# Patient Record
Sex: Male | Born: 1990 | Race: White | Hispanic: No | Marital: Single | Smoking: Former smoker
Health system: Southern US, Community
[De-identification: ages and names within clinical notes are randomized; demographics above are authoritative.]

## PROBLEM LIST (undated history)

## (undated) DIAGNOSIS — J45909 Unspecified asthma, uncomplicated: Secondary | ICD-10-CM

## (undated) HISTORY — DX: Unspecified asthma, uncomplicated: J45.909

## (undated) HISTORY — PX: HERNIA REPAIR: SHX51

---

## 2016-11-16 ENCOUNTER — Ambulatory Visit
Admission: EM | Admit: 2016-11-16 | Discharge: 2016-11-16 | Disposition: A | Discharge status: Home / Self Care | Payer: Self-pay | Attending: Family Medicine | Admitting: Family Medicine

## 2016-11-16 ENCOUNTER — Encounter: Payer: Self-pay | Admitting: Emergency Medicine

## 2016-11-16 DIAGNOSIS — H6502 Acute serous otitis media, left ear: Secondary | ICD-10-CM

## 2016-11-16 DIAGNOSIS — H6123 Impacted cerumen, bilateral: Secondary | ICD-10-CM

## 2016-11-16 MED ORDER — AMOXICILLIN 875 MG PO TABS: 875.0000 mg | ORAL_TABLET | Freq: Two times a day (BID) | ORAL | 0 refills | Status: DC

## 2016-11-16 NOTE — ED Triage Notes (Signed)
Patient c/o left ear pain for the past 3 days ago.

## 2016-11-16 NOTE — ED Provider Notes (Signed)
[facility]    CSN: [id number] Arrival date & time: 11/16/16  [id number]     History   Chief Complaint Chief Complaint  Patient presents with  . Otalgia    HPI Miguel RailCharles G Alkhatib Jr. is a 26 y.o. male.   HPI  History reviewed. No pertinent past medical history.  There are no active problems to display for this patient.   Past Surgical History:  Procedure Laterality Date  . HERNIA REPAIR         Home Medications    Prior to Admission medications   Medication Sig Start Date End Date Taking? Authorizing Provider  amoxicillin (AMOXIL) 875 MG tablet Take 1 tablet (875 mg total) by mouth 2 (two) times daily. 11/16/16   Payton Mccallumrlando Torina Ey, MD    Family History History reviewed. No pertinent family history.  Social History Social History  Substance Use Topics  . Smoking status: Current Every Day Smoker    Packs/day: 1.00    Types: Cigarettes  . Smokeless tobacco: Never Used  . Alcohol use Yes     Allergies   Patient has no known allergies.   Review of Systems Review of Systems   Physical Exam Triage Vital Signs ED Triage Vitals  Enc Vitals Group     BP 11/16/16 0930 (!) 141/91     Pulse Rate 11/16/16 0930 85     Resp 11/16/16 0930 16     Temp 11/16/16 0930 99.6 F (37.6 C)     Temp Source 11/16/16 0930 Oral     SpO2 11/16/16 0930 96 %     Weight 11/16/16 0929 225 lb (102.1 kg)     Height 11/16/16 0929 5\' 6"  (1.676 m)     Head Circumference --      Peak Flow --      Pain Score 11/16/16 0930 6     Pain Loc --      Pain Edu? --      Excl. in GC? --    No data found.   Updated Vital Signs BP (!) 141/91 (BP Location: Left Arm)   Pulse 85   Temp 99.6 F (37.6 C) (Oral)   Resp 16   Ht 5\' 6"  (1.676 m)   Wt 225 lb (102.1 kg)   SpO2 96%   BMI 36.32 kg/m   Visual Acuity Right Eye Distance:   Left Eye Distance:   Bilateral Distance:    Right Eye Near:   Left Eye Near:    Bilateral Near:     Physical Exam  Constitutional: He appears  well-developed and well-nourished. No distress.  HENT:  Head: Normocephalic and atraumatic.  Right Ear: Tympanic membrane, external ear and ear canal normal.  Left Ear: External ear and ear canal normal. Tympanic membrane is erythematous and bulging. A middle ear effusion is present.  Nose: Nose normal.  Mouth/Throat: Uvula is midline, oropharynx is clear and moist and mucous membranes are normal. No oropharyngeal exudate or tonsillar abscesses.  Cerumen impaction of ear canals bilaterally; TMs visualized after cerumen disimpaction  Eyes: Conjunctivae and EOM are normal. Pupils are equal, round, and reactive to light. Right eye exhibits no discharge. Left eye exhibits no discharge. No scleral icterus.  Neck: Normal range of motion. Neck supple. No tracheal deviation present. No thyromegaly present.  Cardiovascular: Normal rate, regular rhythm and normal heart sounds.   Pulmonary/Chest: Effort normal and breath sounds normal. No stridor. No respiratory distress. He has no wheezes. He has no rales. He exhibits  no tenderness.  Lymphadenopathy:    He has no cervical adenopathy.  Neurological: He is alert.  Skin: Skin is warm and dry. No rash noted. He is not diaphoretic.  Nursing note and vitals reviewed.    UC Treatments / Results  Labs (all labs ordered are listed, but only abnormal results are displayed) Labs Reviewed - No data to display  EKG  EKG Interpretation None       Radiology No results found.  Procedures .Ear Cerumen Removal Date/Time: 11/16/2016 10:44 AM Performed by: Payton Mccallum Authorized by: Payton Mccallum   Consent:    Consent obtained:  Verbal   Consent given by:  Patient   Risks discussed:  Bleeding, pain, TM perforation, dizziness, incomplete removal and infection   Alternatives discussed:  No treatment Procedure details:    Location:  L ear and R ear   Procedure type comment:  Both currette and irrigation Post-procedure details:    Inspection:  TM  intact   Hearing quality:  Improved   Patient tolerance of procedure:  Tolerated well, no immediate complications   (including critical care time)  Medications Ordered in UC Medications - No data to display   Initial Impression / Assessment and Plan / UC Course  I have reviewed the triage vital signs and the nursing notes.  Pertinent labs & imaging results that were available during my care of the patient were reviewed by me and considered in my medical decision making (see chart for details).  Clinical Course       Final Clinical Impressions(s) / UC Diagnoses   Final diagnoses:  Acute serous otitis media of left ear, recurrence not specified  Bilateral impacted cerumen    New Prescriptions Discharge Medication List as of 11/16/2016 10:27 AM    START taking these medications   Details  amoxicillin (AMOXIL) 875 MG tablet Take 1 tablet (875 mg total) by mouth 2 (two) times daily., Starting Fri 11/16/2016, Normal       1. diagnosis reviewed with patient 2. rx as per orders above; reviewed possible side effects, interactions, risks and benefits  3. Follow-up prn if symptoms worsen or don't improve   Payton Mccallum, MD 11/16/16 1046

## 2017-02-02 ENCOUNTER — Encounter: Payer: Self-pay | Admitting: Emergency Medicine

## 2017-02-02 ENCOUNTER — Emergency Department: Payer: Self-pay

## 2017-02-02 ENCOUNTER — Emergency Department
Admission: EM | Admit: 2017-02-02 | Discharge: 2017-02-02 | Disposition: A | Discharge status: Home / Self Care | Payer: Self-pay | Attending: Emergency Medicine | Admitting: Emergency Medicine

## 2017-02-02 DIAGNOSIS — J189 Pneumonia, unspecified organism: Secondary | ICD-10-CM | POA: Insufficient documentation

## 2017-02-02 DIAGNOSIS — F17210 Nicotine dependence, cigarettes, uncomplicated: Secondary | ICD-10-CM | POA: Insufficient documentation

## 2017-02-02 LAB — CBC WITH DIFFERENTIAL/PLATELET
BASOS ABS: 0 10*3/uL (ref 0–0.1)
Basophils Relative: 0 %
Eosinophils Absolute: 0 10*3/uL (ref 0–0.7)
Eosinophils Relative: 0 %
HEMATOCRIT: 45.8 % (ref 40.0–52.0)
Hemoglobin: 15.5 g/dL (ref 13.0–18.0)
LYMPHS PCT: 8 %
Lymphs Abs: 0.7 10*3/uL — ABNORMAL LOW (ref 1.0–3.6)
MCH: 29.2 pg (ref 26.0–34.0)
MCHC: 33.9 g/dL (ref 32.0–36.0)
MCV: 86.1 fL (ref 80.0–100.0)
Monocytes Absolute: 1.3 10*3/uL — ABNORMAL HIGH (ref 0.2–1.0)
Monocytes Relative: 15 %
NEUTROS ABS: 6.6 10*3/uL — AB (ref 1.4–6.5)
NEUTROS PCT: 77 %
Platelets: 123 10*3/uL — ABNORMAL LOW (ref 150–440)
RBC: 5.32 MIL/uL (ref 4.40–5.90)
RDW: 14 % (ref 11.5–14.5)
WBC: 8.6 10*3/uL (ref 3.8–10.6)

## 2017-02-02 LAB — BASIC METABOLIC PANEL
Anion gap: 10 (ref 5–15)
BUN: 20 mg/dL (ref 6–20)
CHLORIDE: 98 mmol/L — AB (ref 101–111)
CO2: 24 mmol/L (ref 22–32)
CREATININE: 1.28 mg/dL — AB (ref 0.61–1.24)
Calcium: 8.5 mg/dL — ABNORMAL LOW (ref 8.9–10.3)
GFR calc Af Amer: >60 mL/min (ref >60)
GFR calc non Af Amer: >60 mL/min (ref >60)
GLUCOSE: 131 mg/dL — AB (ref 65–99)
Potassium: 3.8 mmol/L (ref 3.5–5.1)
Sodium: 132 mmol/L — ABNORMAL LOW (ref 135–145)

## 2017-02-02 LAB — INFLUENZA PANEL BY PCR (TYPE A & B)
Influenza A By PCR: NEGATIVE
Influenza B By PCR: NEGATIVE

## 2017-02-02 LAB — LACTIC ACID, PLASMA: Lactic Acid, Venous: 1.8 mmol/L (ref 0.5–1.9)

## 2017-02-02 MED ORDER — IPRATROPIUM-ALBUTEROL 0.5-2.5 (3) MG/3ML IN SOLN
3.0000 mL | Freq: Once | RESPIRATORY_TRACT | Status: AC
  Administered 2017-02-02: 3 mL via RESPIRATORY_TRACT
  Filled 2017-02-02: qty 3

## 2017-02-02 MED ORDER — ACETAMINOPHEN 325 MG PO TABS
650.0000 mg | ORAL_TABLET | Freq: Once | ORAL | Status: AC
  Administered 2017-02-02: 650 mg via ORAL
  Filled 2017-02-02: qty 2

## 2017-02-02 MED ORDER — LEVOFLOXACIN IN D5W 750 MG/150ML IV SOLN
750.0000 mg | Freq: Once | INTRAVENOUS | Status: AC
  Administered 2017-02-02: 750 mg via INTRAVENOUS
  Filled 2017-02-02: qty 150

## 2017-02-02 MED ORDER — IBUPROFEN 600 MG PO TABS
600.0000 mg | ORAL_TABLET | Freq: Once | ORAL | Status: AC
  Administered 2017-02-02: 600 mg via ORAL
  Filled 2017-02-02: qty 1

## 2017-02-02 MED ORDER — LEVOFLOXACIN 750 MG PO TABS: 750.0000 mg | ORAL_TABLET | Freq: Every day | ORAL | 0 refills | Status: AC

## 2017-02-02 MED ORDER — SODIUM CHLORIDE 0.9 % IV BOLUS (SEPSIS)
1000.0000 mL | Freq: Once | INTRAVENOUS | Status: AC
  Administered 2017-02-02: 1000 mL via INTRAVENOUS

## 2017-02-02 NOTE — ED Triage Notes (Signed)
Pt reports feeling bad since late Tuesday night; felt feverish at home but never checked temp; fever here 101.7; short of breath at times; nonproductive cough; sore throat; right ear with intermittent shooting pains; pt talking in complete coherent sentences

## 2017-02-02 NOTE — ED Provider Notes (Signed)
[facility] Emergency Department Provider Note   ____________________________________________   I have reviewed the triage vital signs and the nursing notes.   HISTORY  Chief Complaint Shortness of Breath; Cough; and Fever   History limited by: Not Limited   HPI Miguel Perez. is a 26 y.o. male who presents to the emergency department today because of concerns for shortness of breath, body aches, fever. The symptoms have been going on for 4 days. Patient states he does have a history of asthma and has used his inhalers without any significant relief. The patient has had an associated cough which is productive of some phlegm. No bloody phlegm.   History reviewed. No pertinent past medical history.  There are no active problems to display for this patient.   Past Surgical History:  Procedure Laterality Date  . HERNIA REPAIR      Prior to Admission medications   Not on File    Allergies Patient has no known allergies.  History reviewed. No pertinent family history.  Social History Social History  Substance Use Topics  . Smoking status: Current Every Day Smoker    Packs/day: 1.00    Types: Cigarettes  . Smokeless tobacco: Never Used  . Alcohol use Yes    Review of Systems  Constitutional: Positive for fever. Cardiovascular: Negative for chest pain. Respiratory: Positive for shortness of breath. Gastrointestinal: Negative for abdominal pain, vomiting and diarrhea. Genitourinary: Negative for dysuria. Musculoskeletal: Positive for body aches.  Skin: Negative for rash. Neurological: Negative for headaches, focal weakness or numbness.  10-point ROS otherwise negative.  ____________________________________________   PHYSICAL EXAM:  VITAL SIGNS: ED Triage Vitals  Enc Vitals Group     BP 02/02/17 0031 133/90     Pulse Rate 02/02/17 0031 (!) 135     Resp 02/02/17 0031 (!) 22     Temp 02/02/17 0031 (!) 101.7 F (38.7 C)      Temp Source 02/02/17 0031 Oral     SpO2 02/02/17 0031 93 %     Weight 02/02/17 0031 225 lb (102.1 kg)     Height 02/02/17 0031  (1.651 m)     Head Circumference --      Peak Flow --      Pain Score 02/02/17 0030 0    Constitutional: Alert and oriented. Well appearing and in no distress. Eyes: Conjunctivae are normal. Normal extraocular movements. ENT   Head: Normocephalic and atraumatic.   Nose: No congestion/rhinnorhea.   Mouth/Throat: Mucous membranes are moist.   Neck: No stridor. Hematological/Lymphatic/Immunilogical: No cervical lymphadenopathy. Cardiovascular: Normal rate, regular rhythm.  No murmurs, rubs, or gallops.  Respiratory: Normal respiratory effort without tachypnea nor retractions. Diffuse expiratory wheezing. Gastrointestinal: Soft and non tender. No rebound. No guarding.  Genitourinary: Deferred Musculoskeletal: Normal range of motion in all extremities. No lower extremity edema. Neurologic:  Normal speech and language. No gross focal neurologic deficits are appreciated.  Skin:  Skin is warm, dry and intact. No rash noted. Psychiatric: Mood and affect are normal. Speech and behavior are normal. Patient exhibits appropriate insight and judgment.  ____________________________________________    LABS (pertinent positives/negatives)  Labs Reviewed  CBC WITH DIFFERENTIAL/PLATELET - Abnormal; Notable for the following:       Result Value   Platelets 123 (*)    Neutro Abs 6.6 (*)    Lymphs Abs 0.7 (*)    Monocytes Absolute 1.3 (*)    All other components within normal limits  BASIC METABOLIC PANEL -  Abnormal; Notable for the following:    Sodium 132 (*)    Chloride 98 (*)    Glucose, Bld 131 (*)    Creatinine, Ser 1.28 (*)    Calcium 8.5 (*)    All other components within normal limits  LACTIC ACID, PLASMA  INFLUENZA PANEL BY PCR (TYPE A & B)      ____________________________________________   EKG  None  ____________________________________________    RADIOLOGY  CXR IMPRESSION:  Central bronchial thickening. Streaky opacities in the left lung  base may be atelectasis versus early pneumonia.      ____________________________________________   PROCEDURES  Procedures  ____________________________________________   INITIAL IMPRESSION / ASSESSMENT AND PLAN / ED COURSE  Pertinent labs & imaging results that were available during my care of the patient were reviewed by me and considered in my medical decision making (see chart for details).  Patient presented to the emergency department today with headaches, shortness of breath. Patient was also febrile. X-ray was concerning for possible pneumonia. Because of this patient was given a dose of IV antibiotics here. He was also given a couple DuoNeb treatments patient patient stated he did feel better. Heart rate did come down with IV fluids however they continued be slightly elevated secondary to doing nebs. Patient will be discharged with further antibiotics.  ____________________________________________   FINAL CLINICAL IMPRESSION(S) / ED DIAGNOSES  Final diagnoses:  Community acquired pneumonia, unspecified laterality     Note: This dictation was prepared with Office manager. Any transcriptional errors that result from this process are unintentional     Phineas Semen, MD 02/02/17 [phone]

## 2017-02-02 NOTE — Discharge Instructions (Signed)
Please seek medical attention for any high fevers, chest pain, shortness of breath, change in behavior, persistent vomiting, bloody stool or any other new or concerning symptoms.  

## 2017-07-31 ENCOUNTER — Ambulatory Visit
Admission: EM | Admit: 2017-07-31 | Discharge: 2017-07-31 | Disposition: A | Discharge status: Home / Self Care | Payer: Self-pay | Attending: Family Medicine | Admitting: Family Medicine

## 2017-07-31 DIAGNOSIS — J4521 Mild intermittent asthma with (acute) exacerbation: Secondary | ICD-10-CM

## 2017-07-31 DIAGNOSIS — J209 Acute bronchitis, unspecified: Secondary | ICD-10-CM

## 2017-07-31 MED ORDER — BENZONATATE 100 MG PO CAPS: 100.0000 mg | ORAL_CAPSULE | Freq: Three times a day (TID) | ORAL | 0 refills | Status: DC | PRN

## 2017-07-31 MED ORDER — AZITHROMYCIN 250 MG PO TABS: ORAL_TABLET | ORAL | 0 refills | Status: DC

## 2017-07-31 NOTE — ED Triage Notes (Signed)
Patient complains of cough x 2 weeks. Patient states that at times the cough is productive. Patient states that cough is worse when he gets hot. Reports that he has tried Robitussin and Delsym.

## 2017-07-31 NOTE — ED Provider Notes (Signed)
[facility]    CSN: [id number] Arrival date & time: 07/31/17  0820     History   Chief Complaint Chief Complaint  Patient presents with  . Cough    HPI Miguel Perez. is a 26 y.o. male.   The history is provided by the patient.  Cough  Associated symptoms: wheezing   Associated symptoms: no headaches   URI  Presenting symptoms: congestion, cough and fatigue   Severity:  Moderate Onset quality:  Sudden Duration:  2 weeks Timing:  Constant Progression:  Worsening Chronicity:  New Relieved by:  None tried Ineffective treatments:  None tried Associated symptoms: wheezing   Associated symptoms: no headaches and no sinus pain   Risk factors: chronic respiratory disease (asthma)   Risk factors: not elderly, no chronic cardiac disease, no chronic kidney disease, no diabetes mellitus, no immunosuppression, no recent illness, no recent travel and no sick contacts     History reviewed. No pertinent past medical history.  There are no active problems to display for this patient.   Past Surgical History:  Procedure Laterality Date  . HERNIA REPAIR         Home Medications    Prior to Admission medications   Medication Sig Start Date End Date Taking? Authorizing Provider  azithromycin (ZITHROMAX Z-PAK) 250 MG tablet 2 tabs po once day 1, then 1 tab po qd for next 4 days 07/31/17   Payton Mccallum, MD  benzonatate (TESSALON) 100 MG capsule Take 1 capsule (100 mg total) by mouth 3 (three) times daily as needed. 07/31/17   Payton Mccallum, MD    Family History History reviewed. No pertinent family history.  Social History Social History  Substance Use Topics  . Smoking status: Current Every Day Smoker    Packs/day: 0.50    Types: Cigarettes  . Smokeless tobacco: Never Used  . Alcohol use Yes     Comment: occasionally     Allergies   Patient has no known allergies.   Review of Systems Review of Systems  Constitutional: Positive for fatigue.    HENT: Positive for congestion. Negative for sinus pain.   Respiratory: Positive for cough and wheezing.   Neurological: Negative for headaches.     Physical Exam Triage Vital Signs ED Triage Vitals  Enc Vitals Group     BP 07/31/17 0833 (!) 149/93     Pulse Rate 07/31/17 0833 76     Resp 07/31/17 0833 17     Temp 07/31/17 0833 98.7 F (37.1 C)     Temp Source 07/31/17 0833 Oral     SpO2 07/31/17 0833 96 %     Weight 07/31/17 0832 225 lb (102.1 kg)     Height 07/31/17 0832  (1.676 m)     Head Circumference --      Peak Flow --      Pain Score 07/31/17 0832 3     Pain Loc --      Pain Edu? --      Excl. in GC? --    No data found.   Updated Vital Signs BP (!) 149/93 (BP Location: Right Arm)   Pulse 76   Temp 98.7 F (37.1 C) (Oral)   Resp 17   Ht  (1.676 m)   Wt 225 lb (102.1 kg)   SpO2 96%   BMI 36.32 kg/m   Visual Acuity Right Eye Distance:   Left Eye Distance:   Bilateral Distance:    Right  Eye Near:   Left Eye Near:    Bilateral Near:     Physical Exam  Constitutional: He appears well-developed and well-nourished. No distress.  HENT:  Head: Normocephalic and atraumatic.  Right Ear: Tympanic membrane, external ear and ear canal normal.  Left Ear: Tympanic membrane, external ear and ear canal normal.  Nose: Nose normal.  Mouth/Throat: Uvula is midline, oropharynx is clear and moist and mucous membranes are normal. No oropharyngeal exudate or tonsillar abscesses.  Eyes: Pupils are equal, round, and reactive to light. Conjunctivae and EOM are normal. Right eye exhibits no discharge. Left eye exhibits no discharge. No scleral icterus.  Neck: Normal range of motion. Neck supple. No tracheal deviation present. No thyromegaly present.  Cardiovascular: Normal rate, regular rhythm and normal heart sounds.   Pulmonary/Chest: Effort normal. No stridor. No respiratory distress. He has wheezes. He has no rales. He exhibits no tenderness.  Diffuse rhonchi  and wheezes bilaterally  Lymphadenopathy:    He has no cervical adenopathy.  Neurological: He is alert.  Skin: Skin is warm and dry. No rash noted. He is not diaphoretic.  Nursing note and vitals reviewed.    UC Treatments / Results  Labs (all labs ordered are listed, but only abnormal results are displayed) Labs Reviewed - No data to display  EKG  EKG Interpretation None       Radiology No results found.  Procedures Procedures (including critical care time)  Medications Ordered in UC Medications - No data to display   Initial Impression / Assessment and Plan / UC Course  I have reviewed the triage vital signs and the nursing notes.  Pertinent labs & imaging results that were available during my care of the patient were reviewed by me and considered in my medical decision making (see chart for details).      Final Clinical Impressions(s) / UC Diagnoses   Final diagnoses:  Mild intermittent asthmatic bronchitis with acute exacerbation    New Prescriptions Discharge Medication List as of 07/31/2017  9:10 AM    START taking these medications   Details  azithromycin (ZITHROMAX Z-PAK) 250 MG tablet 2 tabs po once day 1, then 1 tab po qd for next 4 days, Normal    benzonatate (TESSALON) 100 MG capsule Take 1 capsule (100 mg total) by mouth 3 (three) times daily as needed., Starting Wed 07/31/2017, Normal       1. diagnosis reviewed with patient 2. rx as per orders above; reviewed possible side effects, interactions, risks and benefits  3. Albuterol MDI 2 puffs q 4-6 hours prn (patient states he has at home) 4. Recommend supportive treatment with rest, fluids;  5. Follow-up prn if symptoms worsen or don't improve  Controlled Substance Prescriptions [place] Controlled Substance Registry consulted? Not Applicable   Payton Mccallum, MD 07/31/17 [phone]

## 2017-12-19 ENCOUNTER — Other Ambulatory Visit: Payer: Self-pay

## 2017-12-19 ENCOUNTER — Encounter: Payer: Self-pay | Admitting: Emergency Medicine

## 2017-12-19 ENCOUNTER — Ambulatory Visit
Admission: EM | Admit: 2017-12-19 | Discharge: 2017-12-19 | Disposition: A | Discharge status: Home / Self Care | Payer: Self-pay | Attending: Family Medicine | Admitting: Family Medicine

## 2017-12-19 DIAGNOSIS — J40 Bronchitis, not specified as acute or chronic: Secondary | ICD-10-CM

## 2017-12-19 DIAGNOSIS — J4521 Mild intermittent asthma with (acute) exacerbation: Secondary | ICD-10-CM

## 2017-12-19 MED ORDER — AZITHROMYCIN 250 MG PO TABS: 250.0000 mg | ORAL_TABLET | Freq: Every day | ORAL | 0 refills | Status: DC

## 2017-12-19 MED ORDER — BENZONATATE 200 MG PO CAPS: ORAL_CAPSULE | ORAL | 0 refills | Status: DC

## 2017-12-19 NOTE — ED Triage Notes (Signed)
Patient c/o cough and chest congestion for a month.  Patient denies fevers.  

## 2017-12-19 NOTE — ED Provider Notes (Signed)
[facility]    CSN: [id number] Arrival date & time: 12/19/17  1445     History   Chief Complaint Chief Complaint  Patient presents with  . Cough    HPI Miguel RailCharles G Bow Jr. is a 27 y.o. male.   HPI   A 27 year old male presents with a one-month history of cough is nonproductive and chest congestion.  Review of available previous medical records patient has a history of asthma as well as pneumonia in the past.  Smoker of one pack of cigarettes per day.  Dates that he coughs continuously and is not worse at nighttime.  He does have albuterol inhalers at home and has used in them intermittently.  He does complain that sometimes it does make him cough more when he uses the inhalers.        History reviewed. No pertinent past medical history.  There are no active problems to display for this patient.   Past Surgical History:  Procedure Laterality Date  . HERNIA REPAIR         Home Medications    Prior to Admission medications   Medication Sig Start Date End Date Taking? Authorizing Provider  azithromycin (ZITHROMAX) 250 MG tablet Take 1 tablet (250 mg total) by mouth daily. Take first 2 tablets together, then 1 every day until finished. 12/19/17   Lutricia Feiloemer, Takila Kronberg P, PA-C  benzonatate (TESSALON) 200 MG capsule Take one cap TID PRN cough 12/19/17   Lutricia Feiloemer, Koi Zangara P, PA-C    Family History Family History  Problem Relation Age of Onset  . Hypertension Mother     Social History Social History   Tobacco Use  . Smoking status: Current Every Day Smoker    Packs/day: 0.50    Types: Cigarettes  . Smokeless tobacco: Never Used  Substance Use Topics  . Alcohol use: Yes    Comment: occasionally  . Drug use: No     Allergies   Patient has no known allergies.   Review of Systems Review of Systems  Constitutional: Positive for activity change. Negative for chills, fatigue and fever.  Respiratory: Positive for cough, shortness of breath and wheezing.    All other systems reviewed and are negative.    Physical Exam Triage Vital Signs ED Triage Vitals  Enc Vitals Group     BP 12/19/17 1512 (!) 157/80     Pulse Rate 12/19/17 1512 88     Resp 12/19/17 1512 16     Temp 12/19/17 1512 98.6 F (37 C)     Temp Source 12/19/17 1512 Oral     SpO2 12/19/17 1512 98 %     Weight 12/19/17 1509 230 lb (104.3 kg)     Height 12/19/17 1509 5\' 5"  (1.651 m)     Head Circumference --      Peak Flow --      Pain Score 12/19/17 1509 0     Pain Loc --      Pain Edu? --      Excl. in GC? --    No data found.  Updated Vital Signs BP (!) 157/80 (BP Location: Left Arm)   Pulse 88   Temp 98.7 F (37.1 C) (Oral)   Resp 16   Ht 5\' 5"  (1.651 m)   Wt 230 lb (104.3 kg)   SpO2 98%   BMI 38.27 kg/m   Visual Acuity Right Eye Distance:   Left Eye Distance:   Bilateral Distance:    Right Eye Near:  Left Eye Near:    Bilateral Near:     Physical Exam  Constitutional: He is oriented to person, place, and time. He appears well-developed and well-nourished. No distress.  HENT:  Head: Normocephalic.  Eyes: Pupils are equal, round, and reactive to light. Right eye exhibits no discharge. Left eye exhibits no discharge.  Neck: Normal range of motion.  Pulmonary/Chest: Effort normal. No stridor. No respiratory distress. He has wheezes.  Musculoskeletal: Normal range of motion.  Lymphadenopathy:    He has no cervical adenopathy.  Neurological: He is alert and oriented to person, place, and time.  Skin: Skin is warm and dry. He is not diaphoretic.  Psychiatric: He has a normal mood and affect. His behavior is normal. Judgment and thought content normal.  Nursing note and vitals reviewed.    UC Treatments / Results  Labs (all labs ordered are listed, but only abnormal results are displayed) Labs Reviewed - No data to display  EKG  EKG Interpretation None       Radiology No results found.  Procedures Procedures (including critical care  time)  Medications Ordered in UC Medications - No data to display   Initial Impression / Assessment and Plan / UC Course  I have reviewed the triage vital signs and the nursing notes.  Pertinent labs & imaging results that were available during my care of the patient were reviewed by me and considered in my medical decision making (see chart for details).    Plan: 1. Test/x-ray results and diagnosis reviewed with patient 2. rx as per orders; risks, benefits, potential side effects reviewed with patient 3. Recommend supportive treatment with albuterol inhaler as necessary for shortness of breath and wheezing.  Has a history of asthmatic bronchitis as well as his previous history and the length of time that the patient has been having the symptoms I will start him on a Z-Pak.  He does not require steroids at this present time.  I encouraged him to stay stop smoking.  Improving he can follow-up in our clinic or the emergency room. 4. F/u prn if symptoms worsen or don't improve   Final Clinical Impressions(s) / UC Diagnoses   Final diagnoses:  Mild intermittent asthmatic bronchitis with acute exacerbation    ED Discharge Orders        Ordered    azithromycin (ZITHROMAX) 250 MG tablet  Daily     12/19/17 1624    benzonatate (TESSALON) 200 MG capsule     12/19/17 1624       Controlled Substance Prescriptions [place] Controlled Substance Registry consulted? Not Applicable   Lutricia Feil, PA-C 12/19/17 1703

## 2018-01-03 ENCOUNTER — Other Ambulatory Visit: Payer: Self-pay

## 2018-01-03 ENCOUNTER — Ambulatory Visit: Payer: Self-pay | Admitting: Nurse Practitioner

## 2018-01-03 ENCOUNTER — Encounter: Payer: Self-pay | Admitting: Nurse Practitioner

## 2018-01-03 ENCOUNTER — Ambulatory Visit (INDEPENDENT_AMBULATORY_CARE_PROVIDER_SITE_OTHER): Payer: Self-pay | Admitting: Nurse Practitioner

## 2018-01-03 VITALS — BP 140/91 | Temp 98.7°F | Resp 18 | Ht 64.5 in | Wt 220.2 lb

## 2018-01-03 DIAGNOSIS — Z7689 Persons encountering health services in other specified circumstances: Secondary | ICD-10-CM

## 2018-01-03 DIAGNOSIS — J4540 Moderate persistent asthma, uncomplicated: Secondary | ICD-10-CM

## 2018-01-03 MED ORDER — BUDESONIDE 90 MCG/ACT IN AEPB: 1.0000 | INHALATION_SPRAY | Freq: Two times a day (BID) | RESPIRATORY_TRACT | 5 refills | Status: DC

## 2018-01-03 MED ORDER — ALBUTEROL SULFATE HFA 108 (90 BASE) MCG/ACT IN AERS: 1.0000 | INHALATION_SPRAY | Freq: Four times a day (QID) | RESPIRATORY_TRACT | 5 refills | Status: DC | PRN

## 2018-01-03 NOTE — Patient Instructions (Addendum)
CJ, Thank you for coming in to clinic today.  1. Start Allegra once daily for 2-4 weeks.  If helping your cough and allergies, continue daily.  2. START pulmicort 1 puff twice daily.  Rinse mouth after inhaling each time to prevent thrush.  Please schedule a follow-up appointment with Wilhelmina McardleLauren Cache Decoursey, AGNP. Return in about 3 months (around 04/05/2018) for aslthma.  If you have any other questions or concerns, please feel free to call the clinic or send a message through MyChart. You may also schedule an earlier appointment if necessary.  You will receive a survey after today's visit either digitally by e-mail or paper by [employer] mail. Your experiences and feedback matter to us.  Please respond so we know how we are doing as we provide care for you.   Wilhelmina McardleLauren Maddyx Wieck, DNP, AGNP-BC Adult Gerontology Nurse Practitioner [facility], [facility]

## 2018-01-03 NOTE — Progress Notes (Signed)
Subjective:    Patient ID: Miguel Rail., male    DOB: August 24, 1991, 27 y.o.   MRN: [id number]  Miguel Wentworth. is a 27 y.o. male presenting on 01/03/2018 for Establish Care (persistent cough x , cough have worsen in the last 1.59mths.)  HPI Establish Care New Provider Pt last seen by PCP in pediatrics many years ago.  Review records in [facility].    Subacute Cough - History of asthma beginning at age 30.   Had trouble initially and had only ever taken proair for symptoms as adolescent.    - Started smoking at age 61, and pt noted more asthma symptoms with smoking.  He reports doing ok until about 2-3 years ago with severe exacerbation.  He started and took Advair regularly about 2 years ago.  Stopped taking it 2/2 gagging with powder and administration of med. - Amount of smoking is increased over last 2 years. 1 ppd - 1.5 ppd.  In last 2 months, pt has started cutting back and is now only smoking 3/4 ppd. - Pt reports mostly non-productive cough that has lasted about 2 months, but became worse about 1.5 months ago and has become persistent.  Occasionally has productive cough with chest congestion, but has no expectoration - Some scratchy throat.  Denies itchy, watery eyes, sneezing, rhinorrhea. - Irritants are few and has no identified triggers. - Occasionally stomach and rib muscles get very sore because of cough.  - Asthma workup 2 years ago was performed at [facility] in [place], [place].  Pt reports having had PFTs performed at that time.   Past Medical History:  Diagnosis Date  . Asthma    Past Surgical History:  Procedure Laterality Date  . HERNIA REPAIR     Social History   Socioeconomic History  . Marital status: Single    Spouse name: Not on file  . Number of children: Not on file  . Years of education: Not on file  . Highest education level: Not on file  Social Needs  . Financial resource strain: Not on file  . Food insecurity - worry: Not on file  .  Food insecurity - inability: Not on file  . Transportation needs - medical: Not on file  . Transportation needs - non-medical: Not on file  Occupational History  . Not on file  Tobacco Use  . Smoking status: Current Every Day Smoker    Packs/day: 0.50    Years: 8.00    Pack years: 4.00    Types: Cigarettes  . Smokeless tobacco: Never Used  Substance and Sexual Activity  . Alcohol use: Yes    Comment: occasionally  . Drug use: No  . Sexual activity: Not on file  Other Topics Concern  . Not on file  Social History Narrative  . Not on file   Family History  Problem Relation Age of Onset  . Hypertension Mother    No current outpatient medications on file prior to visit.   No current facility-administered medications on file prior to visit.     Review of Systems  Constitutional: Negative.   HENT: Negative.   Eyes: Negative.   Respiratory: Positive for cough and shortness of breath (mild - noted as difficulty getting air in).   Cardiovascular: Negative.   Gastrointestinal: Negative.   Endocrine: Negative.   Genitourinary: Negative.   Musculoskeletal: Negative.   Skin: Positive for rash.       Dry, flaky, itchy rash bilateral shins  Allergic/Immunologic:  Negative.   Neurological: Negative.   Hematological: Negative.   Psychiatric/Behavioral: Negative.    Per HPI unless specifically indicated above     Objective:    BP (!) 140/91 (BP Location: Right Arm, Patient Position: Sitting, Cuff Size: Large)   Temp 98.7 F (37.1 C) (Oral)   Resp 18   Ht 5' 4.5" (1.638 m)   Wt 220 lb 3.2 oz (99.9 kg)   SpO2 97%   BMI 37.21 kg/m   Wt Readings from Last 3 Encounters:  01/03/18 220 lb 3.2 oz (99.9 kg)  12/19/17 230 lb (104.3 kg)  07/31/17 225 lb (102.1 kg)    Physical Exam  Constitutional: He is oriented to person, place, and time. He appears well-developed and well-nourished.  HENT:  Head: Normocephalic and atraumatic.  Right Ear: Hearing, tympanic membrane, external  ear and ear canal normal.  Left Ear: Hearing, tympanic membrane, external ear and ear canal normal.  Nose: Nose normal. Right sinus exhibits no maxillary sinus tenderness and no frontal sinus tenderness. Left sinus exhibits no maxillary sinus tenderness and no frontal sinus tenderness.  Mouth/Throat: Uvula is midline, oropharynx is clear and moist and mucous membranes are normal.  Eyes: Conjunctivae are normal. Pupils are equal, round, and reactive to light.  Neck: Normal range of motion. Neck supple.  Cardiovascular: Normal rate, regular rhythm, normal heart sounds and intact distal pulses.  Pulmonary/Chest: Effort normal. He has decreased breath sounds in the right upper field, the right middle field, the right lower field, the left upper field and the left middle field. He has wheezes in the right upper field, the right middle field and the left upper field. He has no rhonchi. He has no rales.  Lymphadenopathy:    He has no cervical adenopathy.  Neurological: He is alert and oriented to person, place, and time.  Skin: Skin is warm and dry.     Psychiatric: He has a normal mood and affect. His behavior is normal.  Vitals reviewed.    Results for orders placed or performed during the hospital encounter of 02/02/17  CBC with Differential  Result Value Ref Range   WBC 8.6 3.8 - 10.6 K/uL   RBC 5.32 4.40 - 5.90 MIL/uL   Hemoglobin 15.5 13.0 - 18.0 g/dL   HCT 40.9 81.1 - 91.4 %   MCV 86.1 80.0 - 100.0 fL   MCH 29.2 26.0 - 34.0 pg   MCHC 33.9 32.0 - 36.0 g/dL   RDW 78.2 95.6 - 21.3 %   Platelets 123 (L) 150 - 440 K/uL   Neutrophils Relative % 77 %   Neutro Abs 6.6 (H) 1.4 - 6.5 K/uL   Lymphocytes Relative 8 %   Lymphs Abs 0.7 (L) 1.0 - 3.6 K/uL   Monocytes Relative 15 %   Monocytes Absolute 1.3 (H) 0.2 - 1.0 K/uL   Eosinophils Relative 0 %   Eosinophils Absolute 0.0 0 - 0.7 K/uL   Basophils Relative 0 %   Basophils Absolute 0.0 0 - 0.1 K/uL  Basic metabolic panel  Result Value  Ref Range   Sodium 132 (L) 135 - 145 mmol/L   Potassium 3.8 3.5 - 5.1 mmol/L   Chloride 98 (L) 101 - 111 mmol/L   CO2 24 22 - 32 mmol/L   Glucose, Bld 131 (H) 65 - 99 mg/dL   BUN 20 6 - 20 mg/dL   Creatinine, Ser 0.86 (H) 0.61 - 1.24 mg/dL   Calcium 8.5 (L) 8.9 - 10.3 mg/dL  GFR calc non Af Amer >60 >60 mL/min   GFR calc Af Amer >60 >60 mL/min   Anion gap 10 5 - 15  Lactic acid, plasma  Result Value Ref Range   Lactic Acid, Venous 1.8 0.5 - 1.9 mmol/L  Influenza panel by PCR (type A & B)  Result Value Ref Range   Influenza A By PCR NEGATIVE NEGATIVE   Influenza B By PCR NEGATIVE NEGATIVE      Assessment & Plan:   Problem List Items Addressed This Visit    None    Visit Diagnoses    Moderate persistent asthma without complication    -  Primary Subacute worsening of asthma over last 6-8 weeks.  Pt with childhood/adolescent asthma worsened by smoking cigarettes.  Symptoms similar to last period of exacerbation/worsening 2 years ago.  Acute exacerbation is not occurring at this time, but pt continues to have poor control. Is not taking any inhalers for rescue or maintenance.  Reports no difficulty with reducing shortness of breath with rest.  Plan: 1. START albuterol 1-2 puffs every 6 hours as needed for wheezing/shortness of breath. 2. START Pulmicort 90 mcg 1 puff twice daily.   3. Track triggers, need for rescue inhaler, and symptoms for monitoring control. 4. [facility] [place]  - [phone]  This is where he had treatment 2 years ago and had PFTs performed. 5. Followup 3-4 months when insurance is obtained.   Relevant Medications   albuterol (PROVENTIL HFA;VENTOLIN HFA) 108 (90 Base) MCG/ACT inhaler   Budesonide (PULMICORT FLEXHALER) 90 MCG/ACT inhaler   Encounter to establish care     Previous PCP was at a pediatrician.  Records will no be requested.  [place] [facility] records will be requested as above.  Past medical, family, and surgical  history reviewed w/ pt in clinic today.        Meds ordered this encounter  Medications  . albuterol (PROVENTIL HFA;VENTOLIN HFA) 108 (90 Base) MCG/ACT inhaler    Sig: Inhale 1-2 puffs into the lungs every 6 (six) hours as needed for wheezing or shortness of breath.    Dispense:  1 Inhaler    Refill:  5    Product selection permitted for pt/insurance brand preference.    Order Specific Question:   Supervising Provider    Answer:   Smitty CordsKARAMALEGOS, ALEXANDER J [[id number]]  . Budesonide (PULMICORT FLEXHALER) 90 MCG/ACT inhaler    Sig: Inhale 1 puff into the lungs 2 (two) times daily.    Dispense:  1 Inhaler    Refill:  5    Order Specific Question:   Supervising Provider    Answer:   Smitty CordsKARAMALEGOS, ALEXANDER J [[id number]]    Follow up plan: Return in about 3 months (around 04/05/2018) for aslthma.  Wilhelmina McardleLauren Annalaura Sauseda, DNP, AGPCNP-BC Adult Gerontology Primary Care Nurse Practitioner [facility] 01/03/2018, 12:55 PM

## 2018-06-27 ENCOUNTER — Ambulatory Visit: Payer: BLUE CROSS/BLUE SHIELD | Admitting: Nurse Practitioner

## 2018-06-27 ENCOUNTER — Other Ambulatory Visit: Payer: Self-pay

## 2018-06-27 ENCOUNTER — Encounter: Payer: Self-pay | Admitting: Nurse Practitioner

## 2018-06-27 VITALS — BP 160/79 | HR 72 | Temp 98.8°F | Resp 16 | Ht 64.5 in | Wt 226.8 lb

## 2018-06-27 DIAGNOSIS — J4541 Moderate persistent asthma with (acute) exacerbation: Secondary | ICD-10-CM

## 2018-06-27 MED ORDER — FLUTICASONE FUROATE-VILANTEROL 100-25 MCG/INH IN AEPB: 1.0000 | INHALATION_SPRAY | Freq: Every day | RESPIRATORY_TRACT | 5 refills | Status: DC

## 2018-06-27 MED ORDER — PEAK FLOW METER DEVI: 1.0000 | Freq: Every day | 0 refills | Status: DC

## 2018-06-27 MED ORDER — PREDNISONE 50 MG PO TABS: 50.0000 mg | ORAL_TABLET | Freq: Every day | ORAL | 0 refills | Status: AC

## 2018-06-27 NOTE — Patient Instructions (Addendum)
Miguel Perez.,   Thank you for coming in to clinic today.  1. START Breo 100 mg one inhalation once daily. 2. Continue albuterol as needed.  3. START prednisong 50 mg once daily for 5 days.   Please schedule a follow-up appointment with Wilhelmina Mcardle, AGNP. Return in about 6 weeks (around 08/08/2018) for asthma.  If you have any other questions or concerns, please feel free to call the clinic or send a message through MyChart. You may also schedule an earlier appointment if necessary.  You will receive a survey after today's visit either digitally by e-mail or paper by [employer]. Your experiences and feedback matter to [place].  Please respond so we know how we are doing as we provide care for you.   Wilhelmina Mcardle, DNP, AGNP-BC Adult Gerontology Nurse Practitioner [facility], [facility]   Peak Flow Meter A peak flow meter is a device that helps you determine how well your asthma is being controlled. The device measures the flow of air out of your lungs. This is a simple but important tool in daily asthma management. Peak flow meters are available over the counter. The readings from the meter will help you and your health care provider:  Determine the severity of your asthma.  Evaluate the effectiveness of your current treatment.  Determine when to add or stop certain medicines.  Recognize an asthma attack before signs or symptoms appear.  Decide when to seek emergency care.  What are the risks?  Dizziness. Breathing too quickly into the peak flow meter may cause dizziness. This could cause you to pass out. Take your time so you do not get dizzy or light-headed.  False readings. Not cleaning your meter may cause false results. You may not know that your asthma is getting better or getting worse, making you to start or stop treatment improperly. How to find your personal best Your "personal best" is the highest peak flow rate you can reach when you feel good and  have no asthma symptoms. This can be used as your standard for comparing your peak flow meter readings. Because everyone's asthma is different, your personal best will be unique to you. Your health care provider will help you to figure out your personal best. Typically, you will take readings once or twice a day for 2 weeks when you are not having symptoms. The highest reading during the trial period is your personal best. Because your lung function can change over time, your personal best should be measured each year. How to use your peak flow meter 1. Move the upper marker to the number that is your personal best. 2. Move the lower marker to the bottom of the numbered scale. 3. Connect the mouthpiece to the peak flow meter. 4. Stand up. 5. Take a deep breath. Make sure you fill your lungs completely. 6. Place your lips tightly around the mouthpiece. Blow as hard and as fast as you can with a single breath (forced exhalation), as if you are blowing out candles. If your lips are not placed tightly around the mouthpiece of the peak flow meter, you will get incorrect low readings. 7. At the end of your forced exhale, check to see what number the lower marker landed on. This is your peak flow rate. 8. Move the lower marker back to the bottom of the numbered scale. 9. Repeat these steps 2 more times. Record the highest reading of the 3 tries in your asthma diary. Do  not calculate the average for your 3 tries, just record the highest. Always write down the results in your asthma diary. After using your peak flow meter, rest and breathe slowly and easily. Keep a record of your progress. Your health care provider can provide you with a simple table to help with this. For the most accurate readings, it is important to keep your peak flow meter clean. Follow the manufacturer's instructions on how to take care of your peak flow meter. Your health care provider will give you instructions on when to do regular  monitoring. You may also need to check your peak flow when:  Coughing, wheezing, or other asthma symptoms wake you up at night.  Your asthma symptoms worsen during the day.  Your breathing is made worse because of a cold, flu, or other respiratory illness.  You need to use your quick-relief, "rescue medicine." It is best to check your peak flow rate before you use rescue medicine, and then 20-30 minutes afterward. This will help determine whether you need to take the rescue medicine again.  How to use your results Use color-coded zones on the meter to see how your peak flow rate compares to your personal best. If your peak flow readings fall too far below your personal best into the yellow or red zone, you will need to take action to prevent or minimize an asthma attack. The color code for each zone reflects progressively more severe symptoms: Green Zone = Stable   Peak flow rate between 80% and 100% of your personal best. Your asthma is under good control when your peak flow rate is in the green zone.  When you are in the green zone, you are not likely to be experiencing any asthma symptoms.  Only take your regular, preventive medicine. Your rescue medicine is not required. Yellow Zone = Caution   Peak flow rate between 50% and 80% of your personal best. Your asthma is getting worse and could be improved.  You may have begun coughing, wheezing, or feeling chest tightness. Sometimes peak flow rates dip down into the yellow zone before asthma symptoms appear.  Consider increasing or changing your asthma medicine. This may include using your rescue medicine. If you have an asthma action plan, follow each step listed for the yellow zone, including medicine changes. Red Zone = Danger   Peak flow rate below 50% of your personal best. This may mean you are having, or are at risk of having, a medical emergency.  Your coughing, wheezing, or shortness of breath may have become severe. Do not  wait to use your rescue medicine. Stop whatever you are doing and use your rescue inhaler, nebulizer, or other medicines to open your airways.  If you have an asthma action plan, follow each step listed for the red zone, including medicine changes and when to seek emergency medical care. Contact a health care provider if: You are in the yellow zone. If you have an asthma action plan, follow all of the steps listed under the yellow zone section of the plan. Let your health care provider know that you are in the yellow zone. Get help right away if: You are in the red zone. If you have an asthma action plan, follow all of the steps listed under the red zone section of the plan while you are seeking immediate medical care. Summary  A peak flow meter is a device that helps you determine how well your asthma is being controlled. The device  measures the flow of air out of your lungs.  Readings from the meter will help you determine the severity of your asthma, whether your treatment is working, and when to start or stop treatment.  Measure your personal best every year during periods of no symptoms. The meter will compare this reading to your regular readings to determine your condition at a given time.  Work with your health care provider to understand what each zone (green, yellow, red) means and what actions to take in each zone. This information is not intended to replace advice given to you by your health care provider. Make sure you discuss any questions you have with your health care provider. Document Released: 08/19/2007 Document Revised: 10/18/2016 Document Reviewed: 10/18/2016 Elsevier Interactive Patient Education  2017 [employer]

## 2018-06-27 NOTE — Progress Notes (Signed)
Subjective:    Patient ID: Binnie Rail., male    DOB: 10/08/1991, 27 y.o.   MRN: [id number]  Miguel Perez. is a 27 y.o. male presenting on 06/27/2018 for Cough (x 3 mths. treated x 3 times w/ abx, inhalers, and cough supression, but no improvement w/ the cough )   HPI Asthma Patient presents today with primary complaint of chronic cough that improved after last treatment about 5 months ago, but has worsened.  He did start albuterol inhaler after last visit with some improvement, but in last 1 month has worsened again.  He never obtained or used pulmicort as directed 2/2 cost of medication.  Patient now has health insurance.   Patient reports no fever, chills, or sweats.  When he gets hot and overheated, symptoms/cough worsen.  Albuterol helps occasionally.  Takes about every other day.  Patient has reduced smoking from 1.5 ppd to 0.5 ppd.    Social History   Tobacco Use  . Smoking status: Current Every Day Smoker    Packs/day: 0.50    Years: 8.00    Pack years: 4.00    Types: Cigarettes  . Smokeless tobacco: Never Used  Substance Use Topics  . Alcohol use: Yes    Comment: occasionally  . Drug use: No    Review of Systems Per HPI unless specifically indicated above     Objective:    BP (!) 160/79 (BP Location: Left Arm, Patient Position: Sitting, Cuff Size: Normal)   Pulse 72   Temp 98.8 F (37.1 C) (Oral)   Resp 16   Ht 5' 4.5" (1.638 m)   Wt 226 lb 12.8 oz (102.9 kg)   SpO2 98%   BMI 38.33 kg/m   Wt Readings from Last 3 Encounters:  06/27/18 226 lb 12.8 oz (102.9 kg)  01/03/18 220 lb 3.2 oz (99.9 kg)  12/19/17 230 lb (104.3 kg)    Physical Exam  Constitutional: He is oriented to person, place, and time. He appears well-developed and well-nourished. No distress.  HENT:  Head: Normocephalic and atraumatic.  Right Ear: Hearing, tympanic membrane, external ear and ear canal normal.  Left Ear: Hearing, tympanic membrane, external ear and ear canal  normal.  Nose: Nose normal. Right sinus exhibits no maxillary sinus tenderness and no frontal sinus tenderness. Left sinus exhibits no maxillary sinus tenderness and no frontal sinus tenderness.  Mouth/Throat: Uvula is midline, oropharynx is clear and moist and mucous membranes are normal. Tonsils are 0 on the right. Tonsils are 0 on the left.  Neck: Normal range of motion. Neck supple.  Cardiovascular: Normal rate, regular rhythm, S1 normal, S2 normal, normal heart sounds and intact distal pulses.  Pulmonary/Chest: Effort normal. No respiratory distress. He has decreased breath sounds (throughout all lobes). He has wheezes (throughout all lobes). He has rhonchi (throughout all lobes).  Neurological: He is alert and oriented to person, place, and time.  Skin: Skin is warm and dry. Capillary refill takes less than 2 seconds.  Psychiatric: He has a normal mood and affect. His behavior is normal.  Vitals reviewed.   Results for orders placed or performed during the hospital encounter of 02/02/17  CBC with Differential  Result Value Ref Range   WBC 8.6 3.8 - 10.6 K/uL   RBC 5.32 4.40 - 5.90 MIL/uL   Hemoglobin 15.5 13.0 - 18.0 g/dL   HCT 78.4 69.6 - 29.5 %   MCV 86.1 80.0 - 100.0 fL   MCH 29.2 26.0 -  34.0 pg   MCHC 33.9 32.0 - 36.0 g/dL   RDW 16.114.0 09.611.5 - 04.514.5 %   Platelets 123 (L) 150 - 440 K/uL   Neutrophils Relative % 77 %   Neutro Abs 6.6 (H) 1.4 - 6.5 K/uL   Lymphocytes Relative 8 %   Lymphs Abs 0.7 (L) 1.0 - 3.6 K/uL   Monocytes Relative 15 %   Monocytes Absolute 1.3 (H) 0.2 - 1.0 K/uL   Eosinophils Relative 0 %   Eosinophils Absolute 0.0 0 - 0.7 K/uL   Basophils Relative 0 %   Basophils Absolute 0.0 0 - 0.1 K/uL  Basic metabolic panel  Result Value Ref Range   Sodium 132 (L) 135 - 145 mmol/L   Potassium 3.8 3.5 - 5.1 mmol/L   Chloride 98 (L) 101 - 111 mmol/L   CO2 24 22 - 32 mmol/L   Glucose, Bld 131 (H) 65 - 99 mg/dL   BUN 20 6 - 20 mg/dL   Creatinine, Ser 4.091.28 (H) 0.61 -  1.24 mg/dL   Calcium 8.5 (L) 8.9 - 10.3 mg/dL   GFR calc non Af Amer >60 >60 mL/min   GFR calc Af Amer >60 >60 mL/min   Anion gap 10 5 - 15  Lactic acid, plasma  Result Value Ref Range   Lactic Acid, Venous 1.8 0.5 - 1.9 mmol/L  Influenza panel by PCR (type A & B)  Result Value Ref Range   Influenza A By PCR NEGATIVE NEGATIVE   Influenza B By PCR NEGATIVE NEGATIVE      Assessment & Plan:   Problem List Items Addressed This Visit    None    Visit Diagnoses    Moderate persistent asthma with acute exacerbation    -  Primary   Relevant Medications   predniSONE (DELTASONE) 50 MG tablet   fluticasone furoate-vilanterol (BREO ELLIPTA) 100-25 MCG/INH AEPB   Peak Flow Meter DEVI    Acute on chronic asthma with exacerbation.  Patient with adequate oxygenation and is not currently decompensated.  Albuterol is sufficient for controlling symptoms at this time.  Suboptimal maintenance medication control.  Complicated by ongoing smoking. - Encouraged patient to stop smoking.  Celebrated progress toward cutting back. - Continue Albuterol inhaler - START Breo - copay card provided. - START using peak flow meter to establish personal best and continue pulmonary rehab. - START prednisone 50 mg one tab once daily for 5 days for exacerbation.  Hold antibiotic at this time as patient is without infectious process. - Followup 6 weeks.   Consider adding montelukast, PFT, vs pulmonary referral.  Meds ordered this encounter  Medications  . predniSONE (DELTASONE) 50 MG tablet    Sig: Take 1 tablet (50 mg total) by mouth daily with breakfast for 5 days.    Dispense:  5 tablet    Refill:  0    Order Specific Question:   Supervising Provider    Answer:   Smitty CordsKARAMALEGOS, ALEXANDER J [[id number]]  . fluticasone furoate-vilanterol (BREO ELLIPTA) 100-25 MCG/INH AEPB    Sig: Inhale 1 puff into the lungs daily.    Dispense:  28 each    Refill:  5    Order Specific Question:   Supervising Provider    Answer:    Smitty CordsKARAMALEGOS, ALEXANDER J [[id number]]  . Peak Flow Meter DEVI    Sig: 1 Device by Does not apply route daily.    Dispense:  1 each    Refill:  0    Order Specific Question:  Supervising Provider    Answer:   Smitty Cords [[id number]]    Follow up plan: Return in about 6 weeks (around 08/08/2018) for asthma.  Wilhelmina Mcardle, DNP, AGPCNP-BC Adult Gerontology Primary Care Nurse Practitioner [facility] 06/27/2018, 3:00 PM

## 2018-06-29 ENCOUNTER — Encounter: Payer: Self-pay | Admitting: Nurse Practitioner

## 2018-08-08 ENCOUNTER — Other Ambulatory Visit: Payer: Self-pay

## 2018-08-08 ENCOUNTER — Ambulatory Visit: Payer: BLUE CROSS/BLUE SHIELD | Admitting: Nurse Practitioner

## 2018-08-08 VITALS — BP 126/87 | HR 95 | Temp 98.6°F | Ht 64.5 in | Wt 232.2 lb

## 2018-08-08 DIAGNOSIS — Z23 Encounter for immunization: Secondary | ICD-10-CM

## 2018-08-08 DIAGNOSIS — J4541 Moderate persistent asthma with (acute) exacerbation: Secondary | ICD-10-CM | POA: Diagnosis not present

## 2018-08-08 MED ORDER — MONTELUKAST SODIUM 10 MG PO TABS: 10.0000 mg | ORAL_TABLET | Freq: Every day | ORAL | 5 refills | Status: DC

## 2018-08-08 NOTE — Patient Instructions (Addendum)
Binnie Rail.,   Thank you for coming in to clinic today.  1. START montelukast 10 mg   2. Continue Breo  3. Call clinic if you get sick and have trouble with your asthma.  4. If using albuterol more than 2 times, 2 days per week, we may need to increase Breo.  Please schedule a follow-up appointment with Wilhelmina Mcardle, AGNP. Return in about 6 months (around 02/07/2019) for asthma.  If you have any other questions or concerns, please feel free to call the clinic or send a message through MyChart. You may also schedule an earlier appointment if necessary.  You will receive a survey after today's visit either digitally by e-mail or paper by [employer]. Your experiences and feedback matter to [place].  Please respond so we know how we are doing as we provide care for you.   Wilhelmina Mcardle, DNP, AGNP-BC Adult Gerontology Nurse Practitioner [facility], [facility]

## 2018-08-08 NOTE — Progress Notes (Signed)
Subjective:    Patient ID: Miguel Rail., male    DOB: 03-17-1991, 27 y.o.   MRN: [id number]  Miguel Fohl. is a 27 y.o. male presenting on 08/08/2018 for Asthma and Foot Swelling (left foot. Pt unsure if its related to a insect bite x 1 week ago to his left ankle.)   HPI Asthma Is able to increase activity without shortness of breath.  Coughing is also much improved.  Prednisone helped until finished.  Then coughing resumed.  He notes more coughing when having pollen/environmental/dust triggers. - Is only using albuterol once a day two days per week. - He was able to continue Breo and notes things are much improved since being on meds regularly.    Foot swelling Patient notes LEFT foot swelling x 1 week after an insect bite.  He is concerned this is related.  He has no other pain or known injury.    Social History   Tobacco Use  . Smoking status: Current Every Day Smoker    Packs/day: 0.50    Years: 8.00    Pack years: 4.00    Types: Cigarettes  . Smokeless tobacco: Never Used  Substance Use Topics  . Alcohol use: Yes    Comment: occasionally  . Drug use: No    Review of Systems Per HPI unless specifically indicated above     Objective:    BP 126/87 (BP Location: Right Arm, Patient Position: Sitting, Cuff Size: Normal)   Pulse 95   Temp 98.6 F (37 C) (Oral)   Ht 5' 4.5" (1.638 m)   Wt 232 lb 3.2 oz (105.3 kg)   SpO2 98%   BMI 39.24 kg/m   Wt Readings from Last 3 Encounters:  08/08/18 232 lb 3.2 oz (105.3 kg)  06/27/18 226 lb 12.8 oz (102.9 kg)  01/03/18 220 lb 3.2 oz (99.9 kg)    Physical Exam  Constitutional: He is oriented to person, place, and time. He appears well-developed and well-nourished. No distress.  HENT:  Head: Normocephalic and atraumatic.  Cardiovascular: Normal rate, regular rhythm, S1 normal, S2 normal, normal heart sounds and intact distal pulses.  Pulmonary/Chest: Effort normal. No respiratory distress. He has decreased breath  sounds (throughout all lobes). He has no wheezes. He has no rhonchi. He has no rales (bilateral lower lobes).  Neurological: He is alert and oriented to person, place, and time.  Skin: Skin is warm and dry. Capillary refill takes less than 2 seconds.  Psychiatric: He has a normal mood and affect. His behavior is normal. Judgment and thought content normal.  Vitals reviewed.   Results for orders placed or performed during the hospital encounter of 02/02/17  CBC with Differential  Result Value Ref Range   WBC 8.6 3.8 - 10.6 K/uL   RBC 5.32 4.40 - 5.90 MIL/uL   Hemoglobin 15.5 13.0 - 18.0 g/dL   HCT 40.9 81.1 - 91.4 %   MCV 86.1 80.0 - 100.0 fL   MCH 29.2 26.0 - 34.0 pg   MCHC 33.9 32.0 - 36.0 g/dL   RDW 78.2 95.6 - 21.3 %   Platelets 123 (L) 150 - 440 K/uL   Neutrophils Relative % 77 %   Neutro Abs 6.6 (H) 1.4 - 6.5 K/uL   Lymphocytes Relative 8 %   Lymphs Abs 0.7 (L) 1.0 - 3.6 K/uL   Monocytes Relative 15 %   Monocytes Absolute 1.3 (H) 0.2 - 1.0 K/uL   Eosinophils Relative 0 %  Eosinophils Absolute 0.0 0 - 0.7 K/uL   Basophils Relative 0 %   Basophils Absolute 0.0 0 - 0.1 K/uL  Basic metabolic panel  Result Value Ref Range   Sodium 132 (L) 135 - 145 mmol/L   Potassium 3.8 3.5 - 5.1 mmol/L   Chloride 98 (L) 101 - 111 mmol/L   CO2 24 22 - 32 mmol/L   Glucose, Bld 131 (H) 65 - 99 mg/dL   BUN 20 6 - 20 mg/dL   Creatinine, Ser 0.98 (H) 0.61 - 1.24 mg/dL   Calcium 8.5 (L) 8.9 - 10.3 mg/dL   GFR calc non Af Amer >60 >60 mL/min   GFR calc Af Amer >60 >60 mL/min   Anion gap 10 5 - 15  Lactic acid, plasma  Result Value Ref Range   Lactic Acid, Venous 1.8 0.5 - 1.9 mmol/L  Influenza panel by PCR (type A & B)  Result Value Ref Range   Influenza A By PCR NEGATIVE NEGATIVE   Influenza B By PCR NEGATIVE NEGATIVE      Assessment & Plan:   Problem List Items Addressed This Visit    None    Visit Diagnoses    Moderate persistent asthma with acute exacerbation    -   Primary Generally improved asthma, mild acute exacerbation with persistent coughing.  Likely has allergy trigger since has been seasonal and only temporarily improved with prednisone.  Plan: 1. Continue Breo 2. Continue albuterol - notify clinic if using more than 2x per day 2 days per week for possible dose increase of Breo. 3. START montelukast 10 mg once daily for allergy triggers. 4. Follow-up 6 months or sooner if needed if cough is not resolving.   Relevant Medications   montelukast (SINGULAIR) 10 MG tablet   Foot swelling Generally improving and is self limited.  Patient will not need any additional treatment.  FOLLOW-UP prn.  Monitor for signs and symptoms of cellulitis which were reviewed with patient.  Needs flu shot     Pt < age 47.  Needs annual influenza vaccine.  Plan: 1. Administer Quad flu vaccine.    Relevant Orders   Flu Vaccine QUAD 36+ mos IM (Completed)      Meds ordered this encounter  Medications  . montelukast (SINGULAIR) 10 MG tablet    Sig: Take 1 tablet (10 mg total) by mouth at bedtime.    Dispense:  30 tablet    Refill:  5    Order Specific Question:   Supervising Provider    Answer:   Smitty Cords [[id number]]    Follow up plan: Return in about 6 months (around 02/07/2019) for asthma.  Wilhelmina Mcardle, DNP, AGPCNP-BC Adult Gerontology Primary Care Nurse Practitioner [facility] 08/08/2018, 4:20 PM

## 2018-09-16 ENCOUNTER — Other Ambulatory Visit: Payer: Self-pay | Admitting: Nurse Practitioner

## 2018-09-16 DIAGNOSIS — J4540 Moderate persistent asthma, uncomplicated: Secondary | ICD-10-CM

## 2018-09-29 ENCOUNTER — Encounter: Payer: Self-pay | Admitting: Nurse Practitioner

## 2018-12-29 ENCOUNTER — Encounter: Payer: Self-pay | Admitting: Nurse Practitioner

## 2018-12-29 ENCOUNTER — Ambulatory Visit: Payer: BLUE CROSS/BLUE SHIELD | Admitting: Nurse Practitioner

## 2018-12-29 ENCOUNTER — Other Ambulatory Visit: Payer: Self-pay

## 2018-12-29 VITALS — BP 159/92 | HR 79 | Temp 98.5°F | Resp 17 | Ht 64.5 in | Wt 237.2 lb

## 2018-12-29 DIAGNOSIS — J4541 Moderate persistent asthma with (acute) exacerbation: Secondary | ICD-10-CM | POA: Diagnosis not present

## 2018-12-29 DIAGNOSIS — Z23 Encounter for immunization: Secondary | ICD-10-CM | POA: Diagnosis not present

## 2018-12-29 DIAGNOSIS — J4550 Severe persistent asthma, uncomplicated: Secondary | ICD-10-CM | POA: Insufficient documentation

## 2018-12-29 NOTE — Progress Notes (Signed)
Subjective:    Patient ID: Miguel Rail., male    DOB: [birth year], 28 y.o.   MRN: [id number]  Miguel Sakai. is a 28 y.o. male presenting on 12/29/2018 for Cough (presistent cough that worsen over the past 2 weeks )   HPI Asthma Patient continues to have cough, but with worsening over last 2 weeks.  Patient continues to take Breo daily and albuterol as needed.  Currently using albuterol about every 2 days. - Patient has been around one sick person, but just a cold - Patient has had only rhinorrhea and no other symptoms. - Patient reports cough is consistent with symptoms when asthma is not well controlled. - Normally feels good breathing control and uses albuterol less than 2 times per week. - No fever, chills, or sweats or other edema/fluid overload. - Continues smoking 1/2 ppd.    Social History   Tobacco Use  . Smoking status: Current Every Day Smoker    Packs/day: 0.50    Years: 8.00    Pack years: 4.00    Types: Cigarettes  . Smokeless tobacco: Never Used  Substance Use Topics  . Alcohol use: Yes    Comment: occasionally  . Drug use: No   Review of Systems Per HPI unless specifically indicated above     Objective:    BP (!) 159/92 (BP Location: Right Arm, Patient Position: Sitting, Cuff Size: Normal)   Pulse 79   Temp 98.5 F (36.9 C) (Oral)   Resp 17   Ht 5' 4.5" (1.638 m)   Wt 237 lb 3.2 oz (107.6 kg)   SpO2 96%   BMI 40.09 kg/m   Wt Readings from Last 3 Encounters:  12/29/18 237 lb 3.2 oz (107.6 kg)  08/08/18 232 lb 3.2 oz (105.3 kg)  06/27/18 226 lb 12.8 oz (102.9 kg)    Physical Exam Vitals signs reviewed.  Constitutional:      General: He is not in acute distress.    Appearance: He is well-developed.  HENT:     Head: Normocephalic and atraumatic.  Cardiovascular:     Rate and Rhythm: Normal rate and regular rhythm.     Pulses:          Radial pulses are 2+ on the right side and 2+ on the left side.       Posterior tibial pulses are  1+ on the right side and 1+ on the left side.     Heart sounds: Normal heart sounds, S1 normal and S2 normal.  Pulmonary:     Effort: Pulmonary effort is normal. No respiratory distress.     Breath sounds: Normal air entry. No decreased air movement. Decreased breath sounds (throughout lobes) present. No wheezing, rhonchi or rales.  Musculoskeletal:     Right lower leg: No edema.     Left lower leg: No edema.  Skin:    General: Skin is warm and dry.     Capillary Refill: Capillary refill takes less than 2 seconds.  Neurological:     Mental Status: He is alert and oriented to person, place, and time.  Psychiatric:        Attention and Perception: Attention normal.        Mood and Affect: Mood and affect normal.        Behavior: Behavior normal. Behavior is cooperative.        Thought Content: Thought content normal.        Judgment: Judgment normal.  Results for orders placed or performed during the hospital encounter of 02/02/17  CBC with Differential  Result Value Ref Range   WBC 8.6 3.8 - 10.6 K/uL   RBC 5.32 4.40 - 5.90 MIL/uL   Hemoglobin 15.5 13.0 - 18.0 g/dL   HCT 25.0 03.7 - 04.8 %   MCV 86.1 80.0 - 100.0 fL   MCH 29.2 26.0 - 34.0 pg   MCHC 33.9 32.0 - 36.0 g/dL   RDW 88.9 16.9 - 45.0 %   Platelets 123 (L) 150 - 440 K/uL   Neutrophils Relative % 77 %   Neutro Abs 6.6 (H) 1.4 - 6.5 K/uL   Lymphocytes Relative 8 %   Lymphs Abs 0.7 (L) 1.0 - 3.6 K/uL   Monocytes Relative 15 %   Monocytes Absolute 1.3 (H) 0.2 - 1.0 K/uL   Eosinophils Relative 0 %   Eosinophils Absolute 0.0 0 - 0.7 K/uL   Basophils Relative 0 %   Basophils Absolute 0.0 0 - 0.1 K/uL  Basic metabolic panel  Result Value Ref Range   Sodium 132 (L) 135 - 145 mmol/L   Potassium 3.8 3.5 - 5.1 mmol/L   Chloride 98 (L) 101 - 111 mmol/L   CO2 24 22 - 32 mmol/L   Glucose, Bld 131 (H) 65 - 99 mg/dL   BUN 20 6 - 20 mg/dL   Creatinine, Ser 3.88 (H) 0.61 - 1.24 mg/dL   Calcium 8.5 (L) 8.9 - 10.3 mg/dL   GFR  calc non Af Amer >60 >60 mL/min   GFR calc Af Amer >60 >60 mL/min   Anion gap 10 5 - 15  Lactic acid, plasma  Result Value Ref Range   Lactic Acid, Venous 1.8 0.5 - 1.9 mmol/L  Influenza panel by PCR (type A & B)  Result Value Ref Range   Influenza A By PCR NEGATIVE NEGATIVE   Influenza B By PCR NEGATIVE NEGATIVE      Assessment & Plan:   Problem List Items Addressed This Visit      Respiratory   Moderate persistent asthma with acute exacerbation - Primary   Relevant Orders   Pulmonary Function Test [facility] Only    Other Visit Diagnoses    Need for diphtheria-tetanus-pertussis (Tdap) vaccine       Relevant Orders   Tdap vaccine greater than or equal to 7yo IM (Completed)    Asthma with mild acute exacerbation today.  No recent PFT and patient due. Patient continues on daily Breo and albuterol prn.  Plan: 1. PFT - request post-bronchodilator testing and methylcholine challenge.   2. May need Singulair in future to help with symptoms.  Could increase Breo if indicated by PFT.   3. START prednisone 50 mg one tab daily x 5 days. 4. Follow-up prn and 6 weeks as scheduled for regular asthma follow-up.  Consider future pulmonary referral as needed.  Follow up plan: Return in about 6 weeks (around 02/09/2019) for as scheduled for Asthma followup.  Wilhelmina Mcardle, DNP, AGPCNP-BC Adult Gerontology Primary Care Nurse Practitioner [facility] 12/29/2018, 3:07 PM

## 2018-12-29 NOTE — Patient Instructions (Addendum)
Binnie Rail.,   Thank you for coming in to clinic today.  1. MILD Asthma exacerbation today. - START prednisone 50 mg one tablet daily for 5 days.  Continue Breo daily and albuterol as needed.  For the next 5-7 days, use albuterol 2 times daily and again 2-3 times as needed.  2. Hospital will call to schedule lung function testing. - IF too expensive, call clinic to schedule spirometry here.  Please schedule a follow-up appointment with Wilhelmina Mcardle, AGNP. Return in about 6 weeks (around 02/09/2019) for as scheduled for Asthma followup.  If you have any other questions or concerns, please feel free to call the clinic or send a message through MyChart. You may also schedule an earlier appointment if necessary.  You will receive a survey after today's visit either digitally by e-mail or paper by [employer]. Your experiences and feedback matter to [place].  Please respond so we know how we are doing as we provide care for you.  Wilhelmina Mcardle, DNP, AGNP-BC Adult Gerontology Nurse Practitioner [facility], [facility]

## 2018-12-30 ENCOUNTER — Telehealth: Payer: Self-pay

## 2018-12-30 DIAGNOSIS — J4541 Moderate persistent asthma with (acute) exacerbation: Secondary | ICD-10-CM

## 2018-12-30 MED ORDER — PREDNISONE 50 MG PO TABS: 50.0000 mg | ORAL_TABLET | Freq: Every day | ORAL | 0 refills | Status: AC

## 2018-12-30 NOTE — Telephone Encounter (Signed)
Prescription is now sent.

## 2018-12-30 NOTE — Addendum Note (Signed)
Addended by: Vernard Gambles on: 12/30/2018 03:52 PM   Modules accepted: Orders

## 2018-12-30 NOTE — Telephone Encounter (Signed)
The pt called with concerns because his prednisone 50 mg was not sent to his [employer].

## 2019-01-01 ENCOUNTER — Encounter: Payer: Self-pay | Admitting: Nurse Practitioner

## 2019-02-10 ENCOUNTER — Ambulatory Visit: Payer: BLUE CROSS/BLUE SHIELD | Admitting: Nurse Practitioner

## 2019-02-11 ENCOUNTER — Ambulatory Visit (INDEPENDENT_AMBULATORY_CARE_PROVIDER_SITE_OTHER): Payer: BLUE CROSS/BLUE SHIELD | Admitting: Nurse Practitioner

## 2019-02-11 ENCOUNTER — Other Ambulatory Visit: Payer: Self-pay

## 2019-02-11 ENCOUNTER — Encounter: Payer: Self-pay | Admitting: Nurse Practitioner

## 2019-02-11 DIAGNOSIS — J4550 Severe persistent asthma, uncomplicated: Secondary | ICD-10-CM

## 2019-02-11 MED ORDER — FLUTICASONE FUROATE-VILANTEROL 200-25 MCG/INH IN AEPB: 1.0000 | INHALATION_SPRAY | Freq: Every day | RESPIRATORY_TRACT | 11 refills | Status: DC

## 2019-02-11 NOTE — Progress Notes (Signed)
Telemedicine Encounter: Disclosed to patient at start of encounter that we will provide appropriate telemedicine services.  Patient consents to be treated via phone prior to discussion. - Patient is at his home and is accessed via telephone. - Services are provided by Wilhelmina McardleLauren Kanan Sobek from [facility].  Subjective:    Patient ID: Miguel Railharles G Haggart Jr., male    DOB: November 17, 1990, 28 y.o.   MRN: [id number]  Miguel RailCharles G Heap Jr. is a 28 y.o. male presenting on 02/11/2019 for Asthma (pt states when he gets overwhelmed he get a hot sensation come over him and he starts the uncontrollable coughing)  HPI Asthma Breathing has improved overall from last visit, but notes his coughing is persistent when he gets overwhelmed and hot. Overwhelmed means when he has reached the limits of his physical activity. - Patient uses his albuterol inhaler and this helps "to a point."  There are other times he uses this and it does not help.  Rest usually relieves symptoms within 15 minutes.   - This occurs about 3-4 times per week.   Patient does not have any regular daytime wheezing, but does have some wheezing late in evening or early morning.  He does not wake up coughing at night.  Current smoker: Wants to quit,  Not quite ready to quit.  No specific barriers to readiness can be identified by patient at this time.  Social History   Tobacco Use  . Smoking status: Current Every Day Smoker    Packs/day: 0.50    Years: 8.00    Pack years: 4.00    Types: Cigarettes  . Smokeless tobacco: Never Used  Substance Use Topics  . Alcohol use: Yes    Comment: occasionally  . Drug use: No    Review of Systems Per HPI unless specifically indicated above     Objective:    There were no vitals taken for this visit.  Wt Readings from Last 3 Encounters:  12/29/18 237 lb 3.2 oz (107.6 kg)  08/08/18 232 lb 3.2 oz (105.3 kg)  06/27/18 226 lb 12.8 oz (102.9 kg)    Physical Exam Patient remotely  monitored.  Verbal communication appropriate.  Cognition normal.   Results for orders placed or performed during the hospital encounter of 02/02/17  CBC with Differential  Result Value Ref Range   WBC 8.6 3.8 - 10.6 K/uL   RBC 5.32 4.40 - 5.90 MIL/uL   Hemoglobin 15.5 13.0 - 18.0 g/dL   HCT 40.945.8 81.140.0 - 91.452.0 %   MCV 86.1 80.0 - 100.0 fL   MCH 29.2 26.0 - 34.0 pg   MCHC 33.9 32.0 - 36.0 g/dL   RDW 78.214.0 95.611.5 - 21.314.5 %   Platelets 123 (L) 150 - 440 K/uL   Neutrophils Relative % 77 %   Neutro Abs 6.6 (H) 1.4 - 6.5 K/uL   Lymphocytes Relative 8 %   Lymphs Abs 0.7 (L) 1.0 - 3.6 K/uL   Monocytes Relative 15 %   Monocytes Absolute 1.3 (H) 0.2 - 1.0 K/uL   Eosinophils Relative 0 %   Eosinophils Absolute 0.0 0 - 0.7 K/uL   Basophils Relative 0 %   Basophils Absolute 0.0 0 - 0.1 K/uL  Basic metabolic panel  Result Value Ref Range   Sodium 132 (L) 135 - 145 mmol/L   Potassium 3.8 3.5 - 5.1 mmol/L   Chloride 98 (L) 101 - 111 mmol/L   CO2 24 22 - 32 mmol/L   Glucose, Bld  131 (H) 65 - 99 mg/dL   BUN 20 6 - 20 mg/dL   Creatinine, Ser 4.16 (H) 0.61 - 1.24 mg/dL   Calcium 8.5 (L) 8.9 - 10.3 mg/dL   GFR calc non Af Amer >60 >60 mL/min   GFR calc Af Amer >60 >60 mL/min   Anion gap 10 5 - 15  Lactic acid, plasma  Result Value Ref Range   Lactic Acid, Venous 1.8 0.5 - 1.9 mmol/L  Influenza panel by PCR (type A & B)  Result Value Ref Range   Influenza A By PCR NEGATIVE NEGATIVE   Influenza B By PCR NEGATIVE NEGATIVE      Assessment & Plan:   Problem List Items Addressed This Visit      Respiratory   Severe persistent allergic asthma without complication   Relevant Medications   fluticasone furoate-vilanterol (BREO ELLIPTA) 200-25 MCG/INH AEPB    Severe persistent asthma without acute exacerbation Improved from 5 months ago and 2 months ago.  Continues to be suboptimally controlled with exercise induced coughing/shortness of breath.    1. Discussed with patient the need to have PFT.   Ordered to be scheduled after COVID-19 pandemic precautions are lifted. 2. Increase Breo to 200-25 mcg/inh  - If increased Breo dose does not improve symptoms, patient will need to be scheduled with Pulmonology for management. 3. Encouraged patient to consider getting peak flow meter again. 4. Follow-up 3 months and sooner if needed.  Meds ordered this encounter  Medications  . fluticasone furoate-vilanterol (BREO ELLIPTA) 200-25 MCG/INH AEPB    Sig: Inhale 1 puff into the lungs daily.    Dispense:  30 each    Refill:  11    Order Specific Question:   Supervising Provider    Answer:   Smitty Cords [[id number]]   - Time spent in direct consultation with patient via telemedicine about above concerns: 9 minutes  Follow up plan: Follow-up 3 months asthma.  Wilhelmina Mcardle, DNP, AGPCNP-BC Adult Gerontology Primary Care Nurse Practitioner [facility] 02/11/2019, 9:04 AM

## 2020-02-25 ENCOUNTER — Other Ambulatory Visit: Payer: Self-pay | Admitting: Nurse Practitioner

## 2020-02-25 DIAGNOSIS — J4550 Severe persistent asthma, uncomplicated: Secondary | ICD-10-CM

## 2020-02-26 NOTE — Telephone Encounter (Signed)
Call to patient- left message to call for appointment. Courtesy refill given.

## 2020-03-03 ENCOUNTER — Other Ambulatory Visit: Payer: Self-pay

## 2020-03-03 ENCOUNTER — Encounter: Payer: Self-pay | Admitting: Family Medicine

## 2020-03-03 ENCOUNTER — Ambulatory Visit (INDEPENDENT_AMBULATORY_CARE_PROVIDER_SITE_OTHER): Payer: BLUE CROSS/BLUE SHIELD | Admitting: Family Medicine

## 2020-03-03 VITALS — BP 175/97 | HR 76 | Temp 97.1°F | Ht 64.5 in | Wt 239.0 lb

## 2020-03-03 DIAGNOSIS — J4540 Moderate persistent asthma, uncomplicated: Secondary | ICD-10-CM

## 2020-03-03 DIAGNOSIS — I10 Essential (primary) hypertension: Secondary | ICD-10-CM

## 2020-03-03 DIAGNOSIS — R635 Abnormal weight gain: Secondary | ICD-10-CM

## 2020-03-03 DIAGNOSIS — J4550 Severe persistent asthma, uncomplicated: Secondary | ICD-10-CM

## 2020-03-03 DIAGNOSIS — J4541 Moderate persistent asthma with (acute) exacerbation: Secondary | ICD-10-CM

## 2020-03-03 DIAGNOSIS — J4551 Severe persistent asthma with (acute) exacerbation: Secondary | ICD-10-CM

## 2020-03-03 LAB — POCT URINALYSIS DIPSTICK
Bilirubin, UA: NEGATIVE
Glucose, UA: NEGATIVE
Ketones, UA: NEGATIVE
Leukocytes, UA: NEGATIVE
Nitrite, UA: NEGATIVE
Protein, UA: POSITIVE — AB
Spec Grav, UA: 1.02 (ref 1.010–1.025)
Urobilinogen, UA: 0.2 E.U./dL
pH, UA: 5 (ref 5.0–8.0)

## 2020-03-03 MED ORDER — PREDNISONE 50 MG PO TABS: ORAL_TABLET | ORAL | 0 refills | Status: DC

## 2020-03-03 MED ORDER — MONTELUKAST SODIUM 10 MG PO TABS: 10.0000 mg | ORAL_TABLET | Freq: Every day | ORAL | 5 refills | Status: DC

## 2020-03-03 MED ORDER — DULERA 100-5 MCG/ACT IN AERO: 2.0000 | INHALATION_SPRAY | Freq: Two times a day (BID) | RESPIRATORY_TRACT | 1 refills | Status: DC

## 2020-03-03 MED ORDER — ALBUTEROL SULFATE HFA 108 (90 BASE) MCG/ACT IN AERS
INHALATION_SPRAY | RESPIRATORY_TRACT | 1 refills | Status: DC
Start: 2020-03-03 — End: 2020-07-04

## 2020-03-03 MED ORDER — LISINOPRIL 10 MG PO TABS: 10.0000 mg | ORAL_TABLET | Freq: Every day | ORAL | 1 refills | Status: DC

## 2020-03-03 NOTE — Assessment & Plan Note (Signed)
Diagnosed 03/03/2020 with >4 elevated BP readings by chart review.  Denies any previous diagnosis.  Complications: Morbid obesity.  Plan: 1. BEGIN Lisinopril 10mg  1 tablet daily. 2. We will see you back in clinic in 2 weeks to recheck your blood pressure and make any necessary changes to your medications. 3. Encouraged heart healthy diet and increasing exercise to 30 minutes most days of the week, going no more than 2 days in a row without exercise.

## 2020-03-03 NOTE — Assessment & Plan Note (Addendum)
Severe persistent asthma unresponsive with acute exacerbation.  Reporting poor control with Breo.  Will change therapy to [facility] '100mg'$  2 puffs twice daily.  Reports has not met with pulmonology for many years and denies any recent PFTs.  Plan: 1. Change in therapy: BEGIN Dulera '100mg'$  2 puffs daily and STOP Breo. 2. Refill sent on Singulair 3. Refill sent on albuterol inhaler 4. Sent in Rx for Prednisone '50mg'$  to take 1 tablet daily x 5 days 5. Return to clinic for any worsening of symptoms or if symptoms fail to improve with current treatment. 6. Referral placed to pulmonary for establishment and PFTs.

## 2020-03-03 NOTE — Progress Notes (Signed)
Subjective:    Patient ID: Miguel Dancer., male    DOB: 12-Jul-1991, 29 y.o.   MRN: [id number]  Miguel Larmer. is a 29 y.o. male presenting on 03/03/2020 for Asthma (symptoms worsen over last x 62mhs. The pt associate his symtoms with allergies.  )   HPI  Miguel Perez to clinic for evaluation of worsening asthma.  Reports he has not been taking his singulair, has been having worsening of symptoms >2 per day with cough and shortness of breath.  States feels is not having control over his asthma symptoms with his current regimen with Breo.  Requesting change to medications.  Reports cough, wheezing, some shortness of breath.  Denies fevers, sore throat, change in taste/smell, CP, abdominal pain, n/v/d.  [facility] 01/03/2018  Decreased Interest 0 0  Down, Depressed, Hopeless 0 0  PHQ - 2 Score 0 0    Social History   Tobacco Use  . Smoking status: Current Every Day Smoker    Packs/day: 0.50    Years: 8.00    Pack years: 4.00    Types: Cigarettes  . Smokeless tobacco: Never Used  Substance Use Topics  . Alcohol use: Yes    Comment: occasionally  . Drug use: No    Review of Systems  Constitutional: Negative.   HENT: Negative.   Eyes: Negative.   Respiratory: Positive for cough, shortness of breath and wheezing. Negative for apnea, choking, chest tightness and stridor.   Cardiovascular: Negative.   Gastrointestinal: Negative.   Endocrine: Negative.   Genitourinary: Negative.   Musculoskeletal: Negative.   Skin: Negative.   Allergic/Immunologic: Negative.   Neurological: Negative.   Hematological: Negative.   Psychiatric/Behavioral: Negative.    Per HPI unless specifically indicated above     Objective:    BP (!) 175/97   Pulse 76   Temp (!) 97.1 F (36.2 C) (Temporal)   Ht 5' 4.5" (1.638 m)   Wt 239 lb (108.4 kg)   SpO2 97%   BMI 40.39 kg/m   Wt Readings from Last 3 Encounters:  03/03/20 239 lb (108.4 kg)  12/29/18 237  lb 3.2 oz (107.6 kg)  08/08/18 232 lb 3.2 oz (105.3 kg)    Physical Exam Vitals reviewed.  Constitutional:      General: He is not in acute distress.    Appearance: Normal appearance. He is well-developed and well-groomed. He is obese. He is not ill-appearing or toxic-appearing.  HENT:     Head: Normocephalic.  Eyes:     General: Lids are normal. Vision grossly intact.        Right eye: No discharge.        Left eye: No discharge.     Extraocular Movements: Extraocular movements intact.     Conjunctiva/sclera: Conjunctivae normal.     Pupils: Pupils are equal, round, and reactive to light.  Cardiovascular:     Rate and Rhythm: Normal rate and regular rhythm.     Pulses: Normal pulses.     Heart sounds: Normal heart sounds. No murmur. No friction rub. No gallop.   Pulmonary:     Effort: Pulmonary effort is normal. No respiratory distress.     Breath sounds: No stridor. Wheezing present. No rhonchi or rales.  Abdominal:     General: Abdomen is flat. Bowel sounds are normal. There is no distension.     Palpations: Abdomen is soft.     Tenderness: There is no abdominal tenderness.  Musculoskeletal:     Right lower leg: No edema.     Left lower leg: No edema.  Skin:    General: Skin is warm and dry.     Capillary Refill: Capillary refill takes less than 2 seconds.  Neurological:     General: No focal deficit present.     Mental Status: He is alert and oriented to person, place, and time.     Cranial Nerves: No cranial nerve deficit.     Sensory: No sensory deficit.     Motor: No weakness.     Coordination: Coordination normal.     Gait: Gait normal.  Psychiatric:        Attention and Perception: Attention and perception normal.        Mood and Affect: Mood and affect normal.        Speech: Speech normal.        Behavior: Behavior normal. Behavior is cooperative.        Thought Content: Thought content normal.        Cognition and Memory: Cognition and memory normal.         Judgment: Judgment normal.     Results for orders placed or performed in visit on 03/03/20  POCT Urinalysis Dipstick  Result Value Ref Range   Color, UA Yellow    Clarity, UA clear    Glucose, UA Negative Negative   Bilirubin, UA negative    Ketones, UA negative    Spec Grav, UA 1.020 1.010 - 1.025   Blood, UA Trace    pH, UA 5.0 5.0 - 8.0   Protein, UA Positive (A) Negative   Urobilinogen, UA 0.2 0.2 or 1.0 E.U./dL   Nitrite, UA negative    Leukocytes, UA Negative Negative   Appearance     Odor        Assessment & Plan:   Problem List Items Addressed This Visit      Cardiovascular and Mediastinum   Hypertension - Primary    Diagnosed 03/03/2020 with >4 elevated BP readings by chart review.  Denies any previous diagnosis.  Complications: Morbid obesity.  Plan: 1. BEGIN Lisinopril '10mg'$  1 tablet daily. 2. We will see you back in clinic in 2 weeks to recheck your blood pressure and make any necessary changes to your medications. 3. Encouraged heart healthy diet and increasing exercise to 30 minutes most days of the week, going no more than 2 days in a row without exercise.       Relevant Medications   lisinopril (ZESTRIL) 10 MG tablet   Other Relevant Orders   CBC with Differential   COMPLETE METABOLIC PANEL WITH GFR   Thyroid Panel With TSH   POCT Urinalysis Dipstick (Completed)     Respiratory   Severe persistent asthma    Severe persistent asthma unresponsive with acute exacerbation.  Reporting poor control with Breo.  Will change therapy to [facility] '100mg'$  2 puffs twice daily.  Reports has not met with pulmonology for many years and denies any recent PFTs.  Plan: 1. Change in therapy: BEGIN Dulera '100mg'$  2 puffs daily and STOP Breo. 2. Refill sent on Singulair 3. Refill sent on albuterol inhaler 4. Sent in Rx for Prednisone '50mg'$  to take 1 tablet daily x 5 days 5. Return to clinic for any worsening of symptoms or if symptoms fail to improve with current  treatment. 6. Referral placed to pulmonary for establishment and PFTs.      Relevant Medications   albuterol (VENTOLIN  HFA) 108 (90 Base) MCG/ACT inhaler   montelukast (SINGULAIR) 10 MG tablet   mometasone-formoterol (DULERA) 100-5 MCG/ACT AERO   predniSONE (DELTASONE) 50 MG tablet   Other Relevant Orders   Ambulatory referral to Pulmonology    Other Visit Diagnoses    Moderate persistent asthma without complication       Relevant Medications   albuterol (VENTOLIN HFA) 108 (90 Base) MCG/ACT inhaler   montelukast (SINGULAIR) 10 MG tablet   mometasone-formoterol (DULERA) 100-5 MCG/ACT AERO   predniSONE (DELTASONE) 50 MG tablet   Moderate persistent asthma with acute exacerbation       Relevant Medications   albuterol (VENTOLIN HFA) 108 (90 Base) MCG/ACT inhaler   montelukast (SINGULAIR) 10 MG tablet   mometasone-formoterol (DULERA) 100-5 MCG/ACT AERO   predniSONE (DELTASONE) 50 MG tablet   Weight gain       Relevant Orders   Thyroid Panel With TSH   Morbid obesity ([place])       Relevant Orders   Lipid Profile      Meds ordered this encounter  Medications  . albuterol (VENTOLIN HFA) 108 (90 Base) MCG/ACT inhaler    Sig: INHALE 1 TO 2 PUFFS BY MOUTH EVERY 6 HOURS AS NEEDED FOR WHEEZING OR SHORTNESS OF BREATH    Dispense:  6.7 g    Refill:  1    Please consider 90 day supplies to promote better adherence  . montelukast (SINGULAIR) 10 MG tablet    Sig: Take 1 tablet (10 mg total) by mouth at bedtime.    Dispense:  30 tablet    Refill:  5  . lisinopril (ZESTRIL) 10 MG tablet    Sig: Take 1 tablet (10 mg total) by mouth daily.    Dispense:  90 tablet    Refill:  1  . mometasone-formoterol (DULERA) 100-5 MCG/ACT AERO    Sig: Inhale 2 puffs into the lungs 2 (two) times daily.    Dispense:  13 g    Refill:  1  . predniSONE (DELTASONE) 50 MG tablet    Sig: Take 1 tablet daily x 5 days    Dispense:  5 tablet    Refill:  0      Follow up plan: Return in about 2 weeks  (around 03/17/2020) for BP follow up.   Harlin Rain, [place] Family Nurse Practitioner [facility] 03/03/2020, 3:34 PM

## 2020-03-03 NOTE — Patient Instructions (Addendum)
As we discussed, I have sent in a prescription for Prednisone 50mg  to take 1 tablet daily x 5 days.  I have changed your inhaler from [facility] to [facility].  Begin this switch once you have picked up the new prescription, as we discussed, 2 puffs, twice daily.  Continue taking the singulair daily as directed.  I have sent in a prescription for Lisinopril 10mg  to take 1 tablet daily.  We will see you back in 2 weeks to do labs and to recheck your blood pressure.  Some of the possible side effects are:  - angioedema: swelling of lips, mouth, and tongue.  If this rare side effect occurs, please go to ED. - cough: you could develop a dry, hacking cough caused by this medicine.  If it occurs, it will go away after stopping this medicine.  Call the clinic before stopping the medication. - kidney damage: we will monitor your labs when we start this medicine and at least one time per year.  If you do not have an change in kidney function when starting this medicine, it will provide kidney protection over time.  Try to get exercise a minimum of 30 minutes per day at least 5 days per week as well as  adequate water intake all while measuring blood pressure a few times per week.  Keep a blood pressure log and bring back to clinic at your next visit.  If your readings are consistently over 140/90 to contact our office/send me a MyChart message and we will see you sooner.  Can try DASH and Mediterranean diet options, avoiding processed foods, lowering sodium intake, avoiding pork products, and eating a plant based diet for optimal health.  Education and discussion with patient regarding hypertension as well as the effects on the organs and body.  Specifically, we spoke about kidney disease, kidney failure, heart attack, stroke and up to and including death, as likely outcomes if non-compliant with blood pressure regulation.  Discussed how all of these habits are attached to each other and each has the effect on each  other.      Mediterranean Diet  Why follow it? Research shows. . Those who follow the Mediterranean diet have a reduced risk of heart disease  . The diet is associated with a reduced incidence of Parkinson's and Alzheimer's diseases . People following the diet may have longer life expectancies and lower rates of chronic diseases  . The Dietary Guidelines for Americans recommends the Mediterranean diet as an eating plan to promote health and prevent disease  What Is the Mediterranean Diet?  . Healthy eating plan based on typical foods and recipes of Mediterranean-style cooking . The diet is primarily a plant based diet; these foods should make up a majority of meals   Starches - Plant based foods should make up a majority of meals - They are an important sources of vitamins, minerals, energy, antioxidants, and fiber - Choose whole grains, foods high in fiber and minimally processed items  - Typical grain sources include wheat, oats, barley, corn, brown rice, bulgar, farro, millet, polenta, couscous  - Various types of beans include chickpeas, lentils, fava beans, black beans, white beans   Fruits  Veggies - Large quantities of antioxidant rich fruits & veggies; 6 or more servings  - Vegetables can be eaten raw or lightly drizzled with oil and cooked  - Vegetables common to the traditional Mediterranean Diet include: artichokes, arugula, beets, broccoli, brussel sprouts, cabbage, carrots, celery, collard greens, cucumbers, eggplant,  kale, leeks, lemons, lettuce, mushrooms, okra, onions, peas, peppers, potatoes, pumpkin, radishes, rutabaga, shallots, spinach, sweet potatoes, turnips, zucchini - Fruits common to the Mediterranean Diet include: apples, apricots, avocados, cherries, clementines, dates, figs, grapefruits, grapes, melons, nectarines, oranges, peaches, pears, pomegranates, strawberries, tangerines  Fats - Replace butter and margarine with healthy oils, such as olive oil, canola oil,  and tahini  - Limit nuts to no more than a handful a day  - Nuts include walnuts, almonds, pecans, pistachios, pine nuts  - Limit or avoid candied, honey roasted or heavily salted nuts - Olives are central to the [employer] - can be eaten whole or used in a variety of dishes   Meats Protein - Limiting red meat: no more than a few times a month - When eating red meat: choose lean cuts and keep the portion to the size of deck of cards - Eggs: approx. 0 to 4 times a week  - Fish and lean poultry: at least 2 a week  - Healthy protein sources include, chicken, [place], lean beef, lamb - Increase intake of seafood such as tuna, salmon, trout, mackerel, shrimp, scallops - Avoid or limit high fat processed meats such as sausage and bacon  Dairy - Include moderate amounts of low fat dairy products  - Focus on healthy dairy such as fat free yogurt, skim milk, low or reduced fat cheese - Limit dairy products higher in fat such as whole or 2% milk, cheese, ice cream  Alcohol - Moderate amounts of red wine is ok  - No more than 5 oz daily for women (all ages) and men older than age 68  - No more than 10 oz of wine daily for men younger than 1  Other - Limit sweets and other desserts  - Use herbs and spices instead of salt to flavor foods  - Herbs and spices common to the traditional Mediterranean Diet include: basil, bay leaves, chives, cloves, cumin, fennel, garlic, lavender, marjoram, mint, oregano, parsley, pepper, rosemary, sage, savory, sumac, tarragon, thyme   It's not just a diet, it's a lifestyle:  . The Mediterranean diet includes lifestyle factors typical of those in the region  . Foods, drinks and meals are best eaten with others and savored . Daily physical activity is important for overall good health . This could be strenuous exercise like running and aerobics . This could also be more leisurely activities such as walking, housework, yard-work, or taking the stairs . Moderation  is the key; a balanced and healthy diet accommodates most foods and drinks . Consider portion sizes and frequency of consumption of certain foods   Meal Ideas & Options:  . Breakfast:  o Whole wheat toast or whole wheat English muffins with peanut butter & hard boiled egg o Steel cut oats topped with apples & cinnamon and skim milk  o Fresh fruit: banana, strawberries, melon, berries, peaches  o Smoothies: strawberries, bananas, greek yogurt, peanut butter o Low fat greek yogurt with blueberries and granola  o Egg white omelet with spinach and mushrooms o Breakfast couscous: whole wheat couscous, apricots, skim milk, cranberries  . Sandwiches:  o Hummus and grilled vegetables (peppers, zucchini, squash) on whole wheat bread   o Grilled chicken on whole wheat pita with lettuce, tomatoes, cucumbers or tzatziki  o Tuna salad on whole wheat bread: tuna salad made with greek yogurt, olives, red peppers, capers, green onions o Garlic rosemary lamb pita: lamb sauted with garlic, rosemary, salt & pepper; add lettuce,  cucumber, greek yogurt to pita - flavor with lemon juice and black pepper  . Seafood:  o Mediterranean grilled salmon, seasoned with garlic, basil, parsley, lemon juice and black pepper o Shrimp, lemon, and spinach whole-grain pasta salad made with low fat greek yogurt  o Seared scallops with lemon orzo  o Seared tuna steaks seasoned salt, pepper, coriander topped with tomato mixture of olives, tomatoes, olive oil, minced garlic, parsley, green onions and cappers  . Meats:  o Herbed greek chicken salad with kalamata olives, cucumber, feta  o Red bell peppers stuffed with spinach, bulgur, lean ground beef (or lentils) & topped with feta   o Kebabs: skewers of chicken, tomatoes, onions, zucchini, squash  o [place] burgers: made with red onions, mint, dill, lemon juice, feta cheese topped with roasted red peppers . Vegetarian o Cucumber salad: cucumbers, artichoke hearts, celery, red  onion, feta cheese, tossed in olive oil & lemon juice  o Hummus and whole grain pita points with a greek salad (lettuce, tomato, feta, olives, cucumbers, red onion) o Lentil soup with celery, carrots made with vegetable broth, garlic, salt and pepper  o Tabouli salad: parsley, bulgur, mint, scallions, cucumbers, tomato, radishes, lemon juice, olive oil, salt and pepper.  We will plan to see you back in 2 weeks for hypertension follow up  You will receive a survey after today's visit either digitally by e-mail or paper by [place] mail. Your experiences and feedback matter to [place].  Please respond so we know how we are doing as we provide care for you.  Call us with any questions/concerns/needs.  It is my goal to be available to you for your health concerns.  Thanks for choosing me to be a partner in your healthcare needs!  Harlin Rain, FNP-C Family Nurse Practitioner [facility] Phone: [phone]

## 2020-03-17 ENCOUNTER — Other Ambulatory Visit: Payer: Self-pay

## 2020-03-17 ENCOUNTER — Ambulatory Visit (INDEPENDENT_AMBULATORY_CARE_PROVIDER_SITE_OTHER): Payer: BC Managed Care – PPO | Admitting: Family Medicine

## 2020-03-17 ENCOUNTER — Encounter: Payer: Self-pay | Admitting: Family Medicine

## 2020-03-17 VITALS — BP 162/91 | HR 65 | Temp 96.9°F | Ht 64.5 in | Wt 241.8 lb

## 2020-03-17 DIAGNOSIS — R635 Abnormal weight gain: Secondary | ICD-10-CM | POA: Diagnosis not present

## 2020-03-17 DIAGNOSIS — I10 Essential (primary) hypertension: Secondary | ICD-10-CM

## 2020-03-17 LAB — POCT URINALYSIS DIPSTICK
Bilirubin, UA: NEGATIVE
Blood, UA: NEGATIVE
Glucose, UA: NEGATIVE
Ketones, UA: NEGATIVE
Leukocytes, UA: NEGATIVE
Nitrite, UA: NEGATIVE
Protein, UA: POSITIVE — AB
Spec Grav, UA: 1.02 (ref 1.010–1.025)
Urobilinogen, UA: 0.2 E.U./dL
pH, UA: 5 (ref 5.0–8.0)

## 2020-03-17 MED ORDER — LISINOPRIL-HYDROCHLOROTHIAZIDE 20-12.5 MG PO TABS: 1.0000 | ORAL_TABLET | Freq: Every day | ORAL | 1 refills | Status: DC

## 2020-03-17 NOTE — Progress Notes (Signed)
Subjective:    Patient ID: Miguel Rail., male    DOB: 1991-02-28, 28 y.o.   MRN: [id number]  Miguel Perez. is a 29 y.o. male presenting on 03/17/2020 for Hypertension   HPI  Hypertension - He is not checking BP at home or outside of clinic.    - Current medications: lisinopril 10mg  daily, tolerating well without side effects - He is not currently symptomatic. - Pt denies headache, lightheadedness, dizziness, changes in vision, chest tightness/pressure, palpitations, leg swelling, sudden loss of speech or loss of consciousness. - He  reports no regular exercise routine. - His diet is high in salt, high in fat, and high in carbohydrates.  Depression screen [facility] 2/9 03/03/2020 01/03/2018  Decreased Interest 0 0  Down, Depressed, Hopeless 0 0  PHQ - 2 Score 0 0    Social History   Tobacco Use  . Smoking status: Current Every Day Smoker    Packs/day: 0.50    Years: 8.00    Pack years: 4.00    Types: Cigarettes  . Smokeless tobacco: Never Used  Substance Use Topics  . Alcohol use: Yes    Comment: occasionally  . Drug use: No    Review of Systems  Constitutional: Negative.   HENT: Negative.   Eyes: Negative.   Respiratory: Negative.   Cardiovascular: Negative.   Gastrointestinal: Negative.   Endocrine: Negative.   Genitourinary: Negative.   Musculoskeletal: Negative.   Skin: Negative.   Allergic/Immunologic: Negative.   Neurological: Negative.   Hematological: Negative.   Psychiatric/Behavioral: Negative.    Per HPI unless specifically indicated above     Objective:    BP (!) 162/91   Pulse 65   Temp (!) 96.9 F (36.1 C) (Temporal)   Ht 5' 4.5" (1.638 m)   Wt 241 lb 12.8 oz (109.7 kg)   SpO2 98%   BMI 40.86 kg/m   Wt Readings from Last 3 Encounters:  03/17/20 241 lb 12.8 oz (109.7 kg)  03/03/20 239 lb (108.4 kg)  12/29/18 237 lb 3.2 oz (107.6 kg)    Physical Exam Vitals reviewed.  Constitutional:      General: He is not in acute distress.     Appearance: Normal appearance. He is well-developed and well-groomed. He is obese. He is not ill-appearing or toxic-appearing.  HENT:     Head: Normocephalic.     Nose:     Comments: 12/31/18 is in place, covering mouth and nose  Eyes:     General: Lids are normal. Vision grossly intact.        Right eye: No discharge.        Left eye: No discharge.     Extraocular Movements: Extraocular movements intact.     Conjunctiva/sclera: Conjunctivae normal.     Pupils: Pupils are equal, round, and reactive to light.  Cardiovascular:     Rate and Rhythm: Normal rate and regular rhythm.     Pulses: Normal pulses.     Heart sounds: Normal heart sounds. No murmur. No friction rub. No gallop.   Pulmonary:     Effort: Pulmonary effort is normal. No respiratory distress.     Breath sounds: Normal breath sounds.  Musculoskeletal:     Right lower leg: No edema.     Left lower leg: No edema.  Skin:    General: Skin is warm and dry.     Capillary Refill: Capillary refill takes less than 2 seconds.  Neurological:     General:  No focal deficit present.     Mental Status: He is alert and oriented to person, place, and time.     Cranial Nerves: No cranial nerve deficit.     Sensory: No sensory deficit.     Motor: No weakness.     Coordination: Coordination normal.     Gait: Gait normal.  Psychiatric:        Attention and Perception: Attention and perception normal.        Mood and Affect: Mood and affect normal.        Speech: Speech normal.        Behavior: Behavior normal. Behavior is cooperative.        Thought Content: Thought content normal.        Cognition and Memory: Cognition and memory normal.        Judgment: Judgment normal.    Results for orders placed or performed in visit on 03/17/20  POCT Urinalysis Dipstick  Result Value Ref Range   Color, UA yellow    Clarity, UA clear    Glucose, UA Negative Negative   Bilirubin, UA negative    Ketones, UA negative    Spec Grav, UA  1.020 1.010 - 1.025   Blood, UA negative    pH, UA 5.0 5.0 - 8.0   Protein, UA Positive (A) Negative   Urobilinogen, UA 0.2 0.2 or 1.0 E.U./dL   Nitrite, UA negative    Leukocytes, UA Negative Negative   Appearance     Odor        Assessment & Plan:   Problem List Items Addressed This Visit      Cardiovascular and Mediastinum   Hypertension - Primary    Uncontrolled hypertension.  BP is not at goal < 130/80.  Pt is not working on lifestyle modifications.  Taking medications tolerating well without side effects. Complications: Morbid obesity  Plan: 1. STOP taking Lisinopril 10mg  and BEGIN taking Lisinopril-HCTZ 20-12.5mg  daily 2. Obtain labs ordered in next 1-2 weeks 3. Encouraged heart healthy diet and increasing exercise to 30 minutes most days of the week, going no more than 2 days in a row without exercise. 4. Check BP 1-2 x per day at home, keep log, and bring to clinic at next appointment. 5. Follow up 2 weeks.         Relevant Medications   lisinopril-hydrochlorothiazide (ZESTORETIC) 20-12.5 MG tablet   Other Relevant Orders   POCT Urinalysis Dipstick (Completed)   CBC with Differential   COMPLETE METABOLIC PANEL WITH GFR   Lipid Profile   Thyroid Panel With TSH     Other   Morbid obesity ([place])   Relevant Orders   CBC with Differential   COMPLETE METABOLIC PANEL WITH GFR   Lipid Profile   Thyroid Panel With TSH    Other Visit Diagnoses    Weight gain       Relevant Orders   CBC with Differential   COMPLETE METABOLIC PANEL WITH GFR   Lipid Profile   Thyroid Panel With TSH      Meds ordered this encounter  Medications  . lisinopril-hydrochlorothiazide (ZESTORETIC) 20-12.5 MG tablet    Sig: Take 1 tablet by mouth daily.    Dispense:  30 tablet    Refill:  1      Follow up plan: Return in about 2 weeks (around 03/31/2020) for HTN F/U.   Harlin Rain, [place] Family Nurse Practitioner [facility]  03/17/2020, 3:59  PM

## 2020-03-17 NOTE — Assessment & Plan Note (Signed)
Uncontrolled hypertension.  BP is not at goal < 130/80.  Pt is not working on lifestyle modifications.  Taking medications tolerating well without side effects. Complications: Morbid obesity  Plan: 1. STOP taking Lisinopril 10mg  and BEGIN taking Lisinopril-HCTZ 20-12.5mg  daily 2. Obtain labs ordered in next 1-2 weeks 3. Encouraged heart healthy diet and increasing exercise to 30 minutes most days of the week, going no more than 2 days in a row without exercise. 4. Check BP 1-2 x per day at home, keep log, and bring to clinic at next appointment. 5. Follow up 2 weeks.

## 2020-03-17 NOTE — Patient Instructions (Signed)
I have changed your medication from lisinopril 10mg  to lisinopril-hydrochlorothiazide 20-12.5mg  to take once daily.  Try to get exercise a minimum of 30 minutes per day at least 5 days per week as well as  adequate water intake all while measuring blood pressure a few times per week.  Keep a blood pressure log and bring back to clinic at your next visit.  If your readings are consistently over 140/90 to contact our office/send me a MyChart message and we will see you sooner.  Can try DASH and Mediterranean diet options, avoiding processed foods, lowering sodium intake, avoiding pork products, and eating a plant based diet for optimal health.  Education and discussion with patient regarding hypertension as well as the effects on the organs and body.  Specifically, we spoke about kidney disease, kidney failure, heart attack, stroke and up to and including death, as likely outcomes if non-compliant with blood pressure regulation.  Discussed how all of these habits are attached to each other and each has the effect on each other.  We will plan to see you back in 2 weeks for hypertension follow up  You will receive a survey after today's visit either digitally by e-mail or paper by USPS mail. Your experiences and feedback matter to .  Please respond so we know how we are doing as we provide care for you.  Call us with any questions/concerns/needs.  It is my goal to be available to you for your health concerns.  Thanks for choosing me to be a partner in your healthcare needs!  [place], FNP-C Family Nurse Practitioner [facility] Phone: [phone]

## 2020-05-03 ENCOUNTER — Encounter: Payer: Self-pay | Admitting: Pulmonary Disease

## 2020-05-03 ENCOUNTER — Other Ambulatory Visit: Payer: Self-pay

## 2020-05-03 ENCOUNTER — Ambulatory Visit: Payer: BC Managed Care – PPO | Admitting: Pulmonary Disease

## 2020-05-03 VITALS — BP 132/72 | HR 86 | Temp 98.3°F | Ht 66.0 in | Wt 233.8 lb

## 2020-05-03 DIAGNOSIS — J4540 Moderate persistent asthma, uncomplicated: Secondary | ICD-10-CM | POA: Diagnosis not present

## 2020-05-03 DIAGNOSIS — R05 Cough: Secondary | ICD-10-CM

## 2020-05-03 DIAGNOSIS — R059 Cough, unspecified: Secondary | ICD-10-CM

## 2020-05-03 DIAGNOSIS — F17210 Nicotine dependence, cigarettes, uncomplicated: Secondary | ICD-10-CM

## 2020-05-03 DIAGNOSIS — R0683 Snoring: Secondary | ICD-10-CM

## 2020-05-03 DIAGNOSIS — K219 Gastro-esophageal reflux disease without esophagitis: Secondary | ICD-10-CM

## 2020-05-03 DIAGNOSIS — R0602 Shortness of breath: Secondary | ICD-10-CM

## 2020-05-03 MED ORDER — PANTOPRAZOLE SODIUM 40 MG PO TBEC: DELAYED_RELEASE_TABLET | ORAL | 3 refills | Status: DC

## 2020-05-03 MED ORDER — TRELEGY ELLIPTA 100-62.5-25 MCG/INH IN AEPB
1.0000 | INHALATION_SPRAY | Freq: Every day | RESPIRATORY_TRACT | 0 refills | Status: DC
Start: 2020-05-03 — End: 2020-07-04

## 2020-05-03 MED ORDER — TRELEGY ELLIPTA 200-62.5-25 MCG/INH IN AEPB: 1.0000 | INHALATION_SPRAY | Freq: Every day | RESPIRATORY_TRACT | 11 refills | Status: DC

## 2020-05-03 NOTE — Progress Notes (Signed)
Subjective:    Patient ID: Miguel Perez., male    DOB: 07-18-1991, 29 y.o.   MRN: [id number]  HPI Is a 29 year old current smoker (1 PPD) who presents for evaluation of cough and shortness of breath 3 years duration.  He also notices wheezing.  He is kindly referred by Danielle Rankin, FNP.  The patient states that for approximately 3 years years he has has issues with cough which is nonproductive.Marland Kitchen  He states that it is worse in the mornings and sometimes when he gets overheated.  Cough is also noted sometimes postprandially.  He has had this issue intermittently.  He had been fairly well controlled on Breo Ellipta however over the last month this has worsened again.  He had several changes on his medications and currently is on [facility] which he feels is not controlling his cough well.  Of note he also has had addition of an ACE inhibitor for blood pressure control.  The patient has not had any fevers, chills or sweats.  No orthopnea but has had nocturnal awakenings with dyspnea. He does have significant dyspepsia on a daily basis and heartburn.  He also notices sore throat with these episodes.  He has had some mild lower extremity edema.  He does not endorse any other complaint.  Patient's wife states that he has gasping episodes at nighttime but no true apneas he does also have snoring.  Review of Systems A 10 point review of systems was performed and it is as noted above otherwise negative.  Past Medical History:  Diagnosis Date  . Asthma    Past Surgical History:  Procedure Laterality Date  . HERNIA REPAIR     Current Meds  Medication Sig  . albuterol (VENTOLIN HFA) 108 (90 Base) MCG/ACT inhaler INHALE 1 TO 2 PUFFS BY MOUTH EVERY 6 HOURS AS NEEDED FOR WHEEZING OR SHORTNESS OF BREATH  . lisinopril-hydrochlorothiazide (ZESTORETIC) 20-12.5 MG tablet Take 1 tablet by mouth daily.  . montelukast (SINGULAIR) 10 MG tablet Take 1 tablet (10 mg total) by mouth at bedtime.  . [DISCONTINUED]  mometasone-formoterol (DULERA) 100-5 MCG/ACT AERO Inhale 2 puffs into the lungs 2 (two) times daily.   Social History   Tobacco Use  . Smoking status: Current Every Day Smoker    Packs/day: 1.00    Years: 10.00    Pack years: 10.00    Types: Cigarettes  . Smokeless tobacco: Never Used  . Tobacco comment: 1 pack a day 05/03/20 [facility]   Substance Use Topics  . 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  Has 2 dogs no exotic pets.  Has lived previously in [place].  No military history.  No Known Allergies  Immunization History  Administered Date(s) Administered  . Influenza,inj,Quad PF,6+ Mos 08/08/2018  . Tdap 12/29/2018       Objective:   Physical Exam BP 132/72 (BP Location: Right Arm, Cuff Size: Normal)   Pulse 86   Temp 98.3 F (36.8 C) (Temporal)   Ht 5\' 6"  (1.676 m)   Wt 233 lb 12.8 oz (106.1 kg)   SpO2 96%   BMI 37.74 kg/m   GENERAL: Obese young man in no acute distress.  Fully ambulatory. HEAD: Normocephalic, atraumatic.  EYES: Pupils equal, round, reactive to light.  No scleral icterus.  MOUTH: Tongue is coated, Mallampati class I airway. NECK: Supple. No thyromegaly. Trachea midline. No JVD.  No adenopathy. PULMONARY: Symmetrical  air entry scattered end expiratory wheezes no other adventitious sounds.   CARDIOVASCULAR: S1 and S2. Regular rate and rhythm.  No rubs murmurs or gallops heard. GASTROINTESTINAL: Obese abdomen, nondistended. MUSCULOSKELETAL: No joint deformity, no clubbing, no edema.  NEUROLOGIC: No focal deficits noted.  Gait disturbance noted on ambulation.  Speech is fluent. SKIN: Intact,warm,dry.  Limited exam shows no rashes. PSYCH: Mood and behavior normal.      Assessment & Plan:     ICD-10-CM   1. SOB (shortness of breath)  R06.02 Pulmonary Function Test [facility] Only   Patient noted to be wheezing today We will obtain PFTs Optimizing inhaler therapy as below  2. Cough  R05     Likely due to poorly compensated asthma Recommend discontinuation of smoking GERD may be contributing see below Recommend discontinuation of ACE inhibitor'   3. Moderate persistent asthma without complication  J45.40 Pulmonary Function Test [facility] Only   PFTs to exclude element of COPD complicating picture Switch to Trelegy Ellipta 200/62.5/25 1 inhalation daily  4. Gastroesophageal reflux disease, unspecified whether esophagitis present  K21.9    Antireflux measures Protonix 40 mg daily  5. Snoring  R06.83    May need sleep test in the future Will optimize Rx of asthma/GERD first  6. Tobacco dependence due to cigarettes  F17.210    Patient was counseled with regards to discontinuation of smoking Total counseling time 3 to 5 minutes   Meds ordered this encounter  Medications  . Fluticasone-Umeclidin-Vilant (TRELEGY ELLIPTA) 200-62.5-25 MCG/INH AEPB    Sig: Inhale 1 puff into the lungs daily.    Dispense:  28 each    Refill:  11  . pantoprazole (PROTONIX) 40 MG tablet    Sig: Take 1 tablet by mouth daily    Dispense:  30 tablet    Refill:  3  . Fluticasone-Umeclidin-Vilant (TRELEGY ELLIPTA) 100-62.5-25 MCG/INH AEPB    Sig: Inhale 1 puff into the lungs daily.    Dispense:  28 each    Refill:  0    Order Specific Question:   Lot Number?    Answer:   [id number]    Order Specific Question:   Manufacturer?    Answer:   [employer] [[id number]]    Order Specific Question:   Quantity    Answer:   1   Orders Placed This Encounter  Procedures  . Pulmonary Function Test [facility] Only    Standing Status:   Future    Standing Expiration Date:   05/03/2021    Scheduling Instructions:     First available    Order Specific Question:   Full PFT: includes the following: basic spirometry, spirometry pre & post bronchodilator, diffusion capacity (DLCO), lung volumes    Answer:   Full PFT   Discussion:  Patient's cough is multifactorial first and foremost he needs to discontinue smoking this was  stressed to him today.  He does not appear to be well compensated as he was noted to have significant wheezing today.  Recommend switching to Trelegy Ellipta as above.  PFTs have been ordered to determine whether he has an element of COPD aggravating his symptomatology.  Because of his tendency towards cough I recommend that ACE inhibitors be avoided and recommend that these be discontinued and switched to ARB if possible.  I defer this to his primary care provider.  Patient also may need a sleep study in the future however I would like to get his asthma and GERD under control as this may be  the cause for his snoring.  His "gasping" episodes at nighttime may be episodes of reflux.  Per the patient's spouse who presented with him today he does not have apnea per se.  We will see the patient in follow-up in 2 months time he is to contact us prior to that time should any new difficulties arise.  Gailen Shelter, MD  PCCM   *This note was dictated using voice recognition software/Dragon.  Despite best efforts to proofread, errors can occur which can change the meaning.  Any change was purely unintentional.

## 2020-05-03 NOTE — Patient Instructions (Signed)
What we discussed today:  We are switching your Dulera to Trelegy Ellipta 200/62.5/25, 1 inhalation daily a prescription was sent to your pharmacy as well.  You did receive a sample today.  We are getting breathing tests scheduled.  I am starting some medication for your reflux.  You may need a sleep study in the future but we will reassess this on a follow-up appointment.  You need to quit smoking.  I will let your primary provider know that you should have a different blood pressure medicine as the one you are on has a side effect of causing or aggravating cough.

## 2020-05-06 ENCOUNTER — Encounter: Payer: Self-pay | Admitting: Family Medicine

## 2020-05-06 ENCOUNTER — Telehealth (INDEPENDENT_AMBULATORY_CARE_PROVIDER_SITE_OTHER): Payer: BC Managed Care – PPO | Admitting: Family Medicine

## 2020-05-06 VITALS — Ht 64.5 in | Wt 233.0 lb

## 2020-05-06 DIAGNOSIS — I10 Essential (primary) hypertension: Secondary | ICD-10-CM | POA: Diagnosis not present

## 2020-05-06 MED ORDER — LOSARTAN POTASSIUM-HCTZ 50-12.5 MG PO TABS: 1.0000 | ORAL_TABLET | Freq: Every day | ORAL | 1 refills | Status: DC

## 2020-05-06 NOTE — Progress Notes (Signed)
Virtual Visit via Telephone  The purpose of this virtual visit is to provide medical care while limiting exposure to the novel coronavirus (COVID19) for both patient and office staff.  Consent was obtained for phone visit:  Yes.   Answered questions that patient had about telehealth interaction:  Yes.   I discussed the limitations, risks, security and privacy concerns of performing an evaluation and management service by telephone. I also discussed with the patient that there may be a patient responsible charge related to this service. The patient expressed understanding and agreed to proceed.  Patient is at home and is accessed via telephone Services are provided by Harlin Rain, FNP-C from [facility])  ---------------------------------------------------------------------- Chief Complaint  Patient presents with  . Follow-up    pulm. and bp check     S: Reviewed CMA documentation. I have called patient and gathered additional HPI as follows:  Mr. Rigg presents to clinic for blood pressure medication change through telemedicine.  Has recently met with pulmonary for his asthma and PFTs and will be switching from Lisinopril-HCTZ to [place] as of today.  Denies any acute concerns  Patient is currently working  Denies any high risk travel to areas of current concern for [place]. Denies any known or suspected exposure to person with or possibly with COVID19.  Past Medical History:  Diagnosis Date  . Asthma    Social History   Tobacco Use  . Smoking status: Current Every Day Smoker    Packs/day: 1.00    Years: 10.00    Pack years: 10.00    Types: Cigarettes  . Smokeless tobacco: Never Used  . Tobacco comment: 1 pack a day 05/03/20 [facility]   Vaping Use  . Vaping Use: Never used  Substance Use Topics  . Alcohol use: Yes    Comment: occasionally  . Drug use: No    Current Outpatient Medications:  .  albuterol (VENTOLIN HFA) 108 (90 Base) MCG/ACT  inhaler, INHALE 1 TO 2 PUFFS BY MOUTH EVERY 6 HOURS AS NEEDED FOR WHEEZING OR SHORTNESS OF BREATH, Disp: 6.7 g, Rfl: 1 .  Fluticasone-Umeclidin-Vilant (TRELEGY ELLIPTA) 100-62.5-25 MCG/INH AEPB, Inhale 1 puff into the lungs daily., Disp: 28 each, Rfl: 0 .  montelukast (SINGULAIR) 10 MG tablet, Take 1 tablet (10 mg total) by mouth at bedtime., Disp: 30 tablet, Rfl: 5 .  pantoprazole (PROTONIX) 40 MG tablet, Take 1 tablet by mouth daily, Disp: 30 tablet, Rfl: 3 .  lisinopril-hydrochlorothiazide (ZESTORETIC) 20-12.5 MG tablet, Take 1 tablet by mouth daily. (Patient not taking: Reported on 05/06/2020), Disp: 30 tablet, Rfl: 1 .  losartan-hydrochlorothiazide (HYZAAR) 50-12.5 MG tablet, Take 1 tablet by mouth daily., Disp: 90 tablet, Rfl: 1  Depression screen [facility] 2/9 05/06/2020 03/03/2020 01/03/2018  Decreased Interest 0 0 0  Down, Depressed, Hopeless 0 0 0  PHQ - 2 Score 0 0 0  Altered sleeping 0 - -  Tired, decreased energy 0 - -  Change in appetite 0 - -  Feeling bad or failure about yourself  0 - -  Trouble concentrating 0 - -  Moving slowly or fidgety/restless 0 - -  Suicidal thoughts 0 - -  PHQ-9 Score 0 - -  Difficult doing work/chores Not difficult at all - -    No flowsheet data found.  -------------------------------------------------------------------------- O: No physical exam performed due to remote telephone encounter.  Physical Exam: Patient remotely monitored without video.  Verbal communication appropriate.  Cognition normal.  Recent Results (from the past  2160 hour(s))  POCT Urinalysis Dipstick     Status: Abnormal   Collection Time: 03/03/20  3:56 PM  Result Value Ref Range   Color, UA Yellow    Clarity, UA clear    Glucose, UA Negative Negative   Bilirubin, UA negative    Ketones, UA negative    Spec Grav, UA 1.020 1.010 - 1.025   Blood, UA Trace    pH, UA 5.0 5.0 - 8.0   Protein, UA Positive (A) Negative   Urobilinogen, UA 0.2 0.2 or 1.0 E.U./dL   Nitrite, UA  negative    Leukocytes, UA Negative Negative   Appearance     Odor    POCT Urinalysis Dipstick     Status: Abnormal   Collection Time: 03/17/20  3:46 PM  Result Value Ref Range   Color, UA yellow    Clarity, UA clear    Glucose, UA Negative Negative   Bilirubin, UA negative    Ketones, UA negative    Spec Grav, UA 1.020 1.010 - 1.025   Blood, UA negative    pH, UA 5.0 5.0 - 8.0   Protein, UA Positive (A) Negative   Urobilinogen, UA 0.2 0.2 or 1.0 E.U./dL   Nitrite, UA negative    Leukocytes, UA Negative Negative   Appearance     Odor      -------------------------------------------------------------------------- A&P:  Problem List Items Addressed This Visit      Cardiovascular and Mediastinum   Hypertension - Primary    Telephonic re-evaluation.  Has met with pulmonary and they have requested to switch off of Lisinopril-HCTZ due to his cough.  Switching to Losartan-HCTZ 50-12.'5mg'$ .  Educated on medication, patient denies any additional questions/concerns/needs.  Plan: 1. STOP lisinopril-HCTZ and BEGIN losartan-HCTZ daily. 2. Take blood pressure 1-2x per day 3. RTC in 2 weeks      Relevant Medications   losartan-hydrochlorothiazide (HYZAAR) 50-12.5 MG tablet      Meds ordered this encounter  Medications  . losartan-hydrochlorothiazide (HYZAAR) 50-12.5 MG tablet    Sig: Take 1 tablet by mouth daily.    Dispense:  90 tablet    Refill:  1    Follow-up: - Return in 2 weeks for hypertension re-evaluation  Patient verbalizes understanding with the above medical recommendations including the limitation of remote medical advice.  Specific follow-up and call-back criteria were given for patient to follow-up or seek medical care more urgently if needed.  - Time spent in direct consultation with patient on phone: 5 minutes  Harlin Rain, [facility] 05/06/2020, 4:04 PM

## 2020-05-06 NOTE — Assessment & Plan Note (Signed)
Telephonic re-evaluation.  Has met with pulmonary and they have requested to switch off of Lisinopril-HCTZ due to his cough.  Switching to Losartan-HCTZ 50-12.5m.  Educated on medication, patient denies any additional questions/concerns/needs.  Plan: 1. STOP lisinopril-HCTZ and BEGIN losartan-HCTZ daily. 2. Take blood pressure 1-2x per day 3. RTC in 2 weeks

## 2020-05-18 ENCOUNTER — Other Ambulatory Visit: Payer: BC Managed Care – PPO

## 2020-05-19 ENCOUNTER — Ambulatory Visit: Payer: BC Managed Care – PPO

## 2020-05-23 ENCOUNTER — Telehealth: Payer: Self-pay

## 2020-05-23 ENCOUNTER — Other Ambulatory Visit: Payer: Self-pay

## 2020-05-23 ENCOUNTER — Encounter: Payer: Self-pay | Admitting: Family Medicine

## 2020-05-23 ENCOUNTER — Ambulatory Visit: Payer: BC Managed Care – PPO | Admitting: Family Medicine

## 2020-05-23 VITALS — BP 150/71 | HR 69 | Temp 98.4°F | Resp 18 | Ht 64.5 in | Wt 239.2 lb

## 2020-05-23 DIAGNOSIS — I10 Essential (primary) hypertension: Secondary | ICD-10-CM

## 2020-05-23 LAB — CBC WITH DIFFERENTIAL/PLATELET
Absolute Monocytes: 736 cells/uL (ref 200–950)
Basophils Absolute: 48 cells/uL (ref 0–200)
Basophils Relative: 0.6 %
Eosinophils Absolute: 248 cells/uL (ref 15–500)
Eosinophils Relative: 3.1 %
HCT: 44.3 % (ref 38.5–50.0)
Hemoglobin: 15 g/dL (ref 13.2–17.1)
Lymphs Abs: 1920 cells/uL (ref 850–3900)
MCH: 29.2 pg (ref 27.0–33.0)
MCHC: 33.9 g/dL (ref 32.0–36.0)
MCV: 86.4 fL (ref 80.0–100.0)
MPV: 12.4 fL (ref 7.5–12.5)
Monocytes Relative: 9.2 %
Neutro Abs: 5048 cells/uL (ref 1500–7800)
Neutrophils Relative %: 63.1 %
Platelets: 210 10*3/uL (ref 140–400)
RBC: 5.13 10*6/uL (ref 4.20–5.80)
RDW: 13.6 % (ref 11.0–15.0)
Total Lymphocyte: 24 %
WBC: 8 10*3/uL (ref 3.8–10.8)

## 2020-05-23 MED ORDER — AMLODIPINE BESYLATE 5 MG PO TABS: ORAL_TABLET | ORAL | 0 refills | Status: DC

## 2020-05-23 NOTE — Patient Instructions (Addendum)
I have added amlodipine to your hypertension treatment plan.  Begin taking 5mg  daily for the next 7 days, then increase to 10mg  daily.  We will plan to see you back in clinic in 2 weeks for hypertension re-evaluation.  I have sent your blood to the lab for processing and we will contact you when we receive the results.  CONTINUE losartan-hydrochlorothiazide 50-12.5mg  once daily.    Some of the possible side effects are:   - angioedema: swelling of lips, mouth, and tongue.  If this rare side effect occurs, please go to ED.  - cough: you could develop a dry, hacking cough caused by this medicine.  If it occurs, it will go away after stopping this medicine.  Call the clinic before stopping the medication.  - kidney damage: we will monitor your labs when we start this medicine and at least one time per year.  If you do not have an change in kidney function when starting this medicine, it will provide kidney protection over time.   Try to get exercise a minimum of 30 minutes per day at least 5 days per week as well as  adequate water intake all while measuring blood pressure a few times per week.  Keep a blood pressure log and bring back to clinic at your next visit.  If your readings are consistently over 130/80 to contact our office/send me a MyChart message and we will see you sooner.  Can try DASH and Mediterranean diet options, avoiding processed foods, lowering sodium intake, avoiding pork products, and eating a plant based diet for optimal health.  Education and discussion with patient regarding hypertension as well as the effects on the organs and body.  Specifically, we spoke about kidney disease, kidney failure, heart attack, stroke and up to and including death, as likely outcomes if non-compliant with blood pressure regulation.  Discussed how all of these habits are attached to each other and each has the effect on each other.  We will plan to see you back in 2 weeks for hypertension  medication follow up visit  You will receive a survey after today's visit either digitally by e-mail or paper by USPS mail. Your experiences and feedback matter to .  Please respond so we know how we are doing as we provide care for you.  Call with any questions/concerns/needs.  It is my goal to be available to you for your health concerns.  Thanks for choosing me to be a partner in your healthcare needs!  [place], FNP-C Family Nurse Practitioner [facility] Phone: [phone]

## 2020-05-23 NOTE — Assessment & Plan Note (Signed)
Uncontrolled hypertension.  BP is not at goal < 130/80.  Pt is not working on lifestyle modifications.  Taking medications tolerating well without side effects.  Discussed referral to cardiology for assistance with managing blood pressure at this time or adding amlodipine and re-evaluating in 2 weeks.  Patient requesting to begin amlodipine and re-evaluate in 2 weeks. Complications: morbid obesity, uncontrolled asthma  Plan: 1. Continue taking losartan-hctz 50-12.5mg  and BEGIN amlodipine 5mg  daily. 2. Obtain labs for CBC and CMP today  3. Encouraged heart healthy diet and increasing exercise to 30 minutes most days of the week, going no more than 2 days in a row without exercise. 4. Check BP 1-2 x per week at home, keep log, and bring to clinic at next appointment. 5. Follow up 2 weeks.

## 2020-05-23 NOTE — Telephone Encounter (Signed)
Pt is aware of date/time of covid test prior to PFT.  

## 2020-05-23 NOTE — Progress Notes (Signed)
Subjective:    Patient ID: Miguel Rail., male    DOB: 10-01-1991, 29 y.o.   MRN: [id number]  Miguel Radabaugh. is a 29 y.o. male presenting on 05/23/2020 for Hypertension   HPI   Hypertension - He is checking BP at home or outside of clinic.  Readings 130-140/70-80 - Current medications: losartan-HCTZ 50-12.5mg , tolerating well without side effects - He is not currently symptomatic. - Pt denies headache, lightheadedness, dizziness, changes in vision, chest tightness/pressure, palpitations, leg swelling, sudden loss of speech or loss of consciousness. - He  reports no regular exercise routine. - His diet is high in salt, high in fat, and high in carbohydrates.  Depression screen [facility] 2/9 05/06/2020 03/03/2020 01/03/2018  Decreased Interest 0 0 0  Down, Depressed, Hopeless 0 0 0  PHQ - 2 Score 0 0 0  Altered sleeping 0 - -  Tired, decreased energy 0 - -  Change in appetite 0 - -  Feeling bad or failure about yourself  0 - -  Trouble concentrating 0 - -  Moving slowly or fidgety/restless 0 - -  Suicidal thoughts 0 - -  PHQ-9 Score 0 - -  Difficult doing work/chores Not difficult at all - -    Social History   Tobacco Use  . Smoking status: Current Every Day Smoker    Packs/day: 1.00    Years: 10.00    Pack years: 10.00    Types: Cigarettes  . Smokeless tobacco: Never Used  . Tobacco comment: 1 pack a day 05/03/20 [facility]   Vaping Use  . Vaping Use: Never used  Substance Use Topics  . Alcohol use: Yes    Comment: occasionally  . Drug use: No    Review of Systems  Constitutional: Negative.   HENT: Negative.   Eyes: Negative.   Respiratory: Negative.   Cardiovascular: Negative.   Gastrointestinal: Negative.   Endocrine: Negative.   Genitourinary: Negative.   Musculoskeletal: Negative.   Skin: Negative.   Allergic/Immunologic: Negative.   Neurological: Negative.   Hematological: Negative.   Psychiatric/Behavioral: Negative.    Per HPI unless specifically  indicated above     Objective:    BP (!) 150/71 (BP Location: Left Arm, Patient Position: Sitting, Cuff Size: Large)   Pulse 69   Temp 98.4 F (36.9 C) (Oral)   Resp 18   Ht 5' 4.5" (1.638 m)   Wt 239 lb 3.2 oz (108.5 kg)   SpO2 99%   BMI 40.42 kg/m   Wt Readings from Last 3 Encounters:  05/23/20 239 lb 3.2 oz (108.5 kg)  05/06/20 233 lb (105.7 kg)  05/03/20 233 lb 12.8 oz (106.1 kg)    Physical Exam Vitals reviewed.  Constitutional:      General: He is not in acute distress.    Appearance: Normal appearance. He is well-developed and well-groomed. He is morbidly obese. He is not ill-appearing or toxic-appearing.  HENT:     Head: Normocephalic and atraumatic.     Nose:     Comments: Lesia Sago is in place, covering mouth and nose. Eyes:     General:        Right eye: No discharge.        Left eye: No discharge.     Extraocular Movements: Extraocular movements intact.     Conjunctiva/sclera: Conjunctivae normal.     Pupils: Pupils are equal, round, and reactive to light.  Cardiovascular:     Rate and Rhythm: Normal rate and regular  rhythm.     Pulses: Normal pulses.     Heart sounds: Normal heart sounds. No murmur heard.  No friction rub. No gallop.   Pulmonary:     Effort: Pulmonary effort is normal. No respiratory distress.     Breath sounds: Normal breath sounds.  Musculoskeletal:     Right lower leg: No edema.     Left lower leg: No edema.  Skin:    General: Skin is warm and dry.     Capillary Refill: Capillary refill takes less than 2 seconds.  Neurological:     General: No focal deficit present.     Mental Status: He is alert and oriented to person, place, and time.  Psychiatric:        Attention and Perception: Attention and perception normal.        Mood and Affect: Mood and affect normal.        Speech: Speech normal.        Behavior: Behavior normal. Behavior is cooperative.        Thought Content: Thought content normal.        Cognition and Memory:  Cognition and memory normal.    Results for orders placed or performed in visit on 03/17/20  POCT Urinalysis Dipstick  Result Value Ref Range   Color, UA yellow    Clarity, UA clear    Glucose, UA Negative Negative   Bilirubin, UA negative    Ketones, UA negative    Spec Grav, UA 1.020 1.010 - 1.025   Blood, UA negative    pH, UA 5.0 5.0 - 8.0   Protein, UA Positive (A) Negative   Urobilinogen, UA 0.2 0.2 or 1.0 E.U./dL   Nitrite, UA negative    Leukocytes, UA Negative Negative   Appearance     Odor        Assessment & Plan:   Problem List Items Addressed This Visit      Cardiovascular and Mediastinum   Hypertension - Primary    Uncontrolled hypertension.  BP is not at goal < 130/80.  Pt is not working on lifestyle modifications.  Taking medications tolerating well without side effects.  Discussed referral to cardiology for assistance with managing blood pressure at this time or adding amlodipine and re-evaluating in 2 weeks.  Patient requesting to begin amlodipine and re-evaluate in 2 weeks. Complications: morbid obesity, uncontrolled asthma  Plan: 1. Continue taking losartan-hctz 50-12.5mg  and BEGIN amlodipine 5mg  daily. 2. Obtain labs for CBC and CMP today  3. Encouraged heart healthy diet and increasing exercise to 30 minutes most days of the week, going no more than 2 days in a row without exercise. 4. Check BP 1-2 x per week at home, keep log, and bring to clinic at next appointment. 5. Follow up 2 weeks.         Relevant Medications   amLODipine (NORVASC) 5 MG tablet   Other Relevant Orders   COMPLETE METABOLIC PANEL WITH GFR   CBC with Differential      Meds ordered this encounter  Medications  . amLODipine (NORVASC) 5 MG tablet    Sig: Take 1 tablet (5 mg total) by mouth daily for 7 days, THEN 2 tablets (10 mg total) daily.    Dispense:  67 tablet    Refill:  0      Follow up plan: Return in about 2 weeks (around 06/06/2020) for Hypertension follow up  visit.   08/06/2020, FNP Family Nurse Practitioner Charlaine Dalton  [facility] 05/23/2020, 4:56 PM

## 2020-05-24 ENCOUNTER — Encounter: Payer: Self-pay | Admitting: Family Medicine

## 2020-05-24 DIAGNOSIS — R748 Abnormal levels of other serum enzymes: Secondary | ICD-10-CM | POA: Insufficient documentation

## 2020-05-24 LAB — COMPLETE METABOLIC PANEL WITH GFR
AG Ratio: 1.8 (calc) (ref 1.0–2.5)
ALT: 49 U/L — ABNORMAL HIGH (ref 9–46)
AST: 26 U/L (ref 10–40)
Albumin: 4.2 g/dL (ref 3.6–5.1)
Alkaline phosphatase (APISO): 55 U/L (ref 36–130)
BUN: 16 mg/dL (ref 7–25)
CO2: 28 mmol/L (ref 20–32)
Calcium: 8.9 mg/dL (ref 8.6–10.3)
Chloride: 104 mmol/L (ref 98–110)
Creat: 1.02 mg/dL (ref 0.60–1.35)
GFR, Est African American: 115 mL/min/{1.73_m2} (ref >=60)
GFR, Est Non African American: 100 mL/min/{1.73_m2} (ref >=60)
Globulin: 2.4 g/dL (calc) (ref 1.9–3.7)
Glucose, Bld: 101 mg/dL — ABNORMAL HIGH (ref 65–99)
Potassium: 4.2 mmol/L (ref 3.5–5.3)
Sodium: 139 mmol/L (ref 135–146)
Total Bilirubin: 0.6 mg/dL (ref 0.2–1.2)
Total Protein: 6.6 g/dL (ref 6.1–8.1)

## 2020-05-25 ENCOUNTER — Other Ambulatory Visit
Admission: RE | Admit: 2020-05-25 | Discharge: 2020-05-25 | Disposition: A | Discharge status: Home / Self Care | Payer: BC Managed Care – PPO | Source: Ambulatory Visit | Attending: Pulmonary Disease | Admitting: Pulmonary Disease

## 2020-05-25 ENCOUNTER — Other Ambulatory Visit: Payer: Self-pay

## 2020-05-25 DIAGNOSIS — Z20822 Contact with and (suspected) exposure to covid-19: Secondary | ICD-10-CM | POA: Insufficient documentation

## 2020-05-25 DIAGNOSIS — Z01812 Encounter for preprocedural laboratory examination: Secondary | ICD-10-CM | POA: Diagnosis not present

## 2020-05-26 ENCOUNTER — Ambulatory Visit: Payer: BC Managed Care – PPO | Attending: Pulmonary Disease

## 2020-05-26 DIAGNOSIS — F17210 Nicotine dependence, cigarettes, uncomplicated: Secondary | ICD-10-CM | POA: Diagnosis not present

## 2020-05-26 DIAGNOSIS — R0602 Shortness of breath: Secondary | ICD-10-CM

## 2020-05-26 DIAGNOSIS — J4540 Moderate persistent asthma, uncomplicated: Secondary | ICD-10-CM | POA: Diagnosis not present

## 2020-05-26 LAB — PULMONARY FUNCTION TEST ARMC ONLY
DL/VA % pred: 85 %
DL/VA: 4.37 ml/min/mmHg/L
DLCO unc % pred: 116 %
DLCO unc: 32.16 ml/min/mmHg
FEF 25-75 Post: 3.03 L/sec
FEF 25-75 Pre: 2.14 L/sec
FEF2575-%Change-Post: 41 %
FEF2575-%Pred-Post: 74 %
FEF2575-%Pred-Pre: 52 %
FEV1-%Change-Post: 12 %
FEV1-%Pred-Post: 98 %
FEV1-%Pred-Pre: 87 %
FEV1-Post: 3.78 L
FEV1-Pre: 3.36 L
FEV1FVC-%Change-Post: 1 %
FEV1FVC-%Pred-Pre: 79 %
FEV6-%Change-Post: 11 %
FEV6-%Pred-Post: 121 %
FEV6-%Pred-Pre: 109 %
FEV6-Post: 5.58 L
FEV6-Pre: 5.02 L
FEV6FVC-%Change-Post: 0 %
FEV6FVC-%Pred-Post: 98 %
FEV6FVC-%Pred-Pre: 98 %
FVC-%Change-Post: 10 %
FVC-%Pred-Post: 123 %
FVC-%Pred-Pre: 111 %
FVC-Post: 5.69 L
FVC-Pre: 5.14 L
Post FEV1/FVC ratio: 66 %
Post FEV6/FVC ratio: 98 %
Pre FEV1/FVC ratio: 65 %
Pre FEV6/FVC Ratio: 98 %
RV % pred: 214 %
RV: 2.74 L
TLC % pred: 144 %
TLC: 8.38 L

## 2020-05-26 LAB — SARS CORONAVIRUS 2 (TAT 6-24 HRS): SARS Coronavirus 2: NEGATIVE

## 2020-05-26 MED ORDER — ALBUTEROL SULFATE (2.5 MG/3ML) 0.083% IN NEBU
2.5000 mg | INHALATION_SOLUTION | Freq: Once | RESPIRATORY_TRACT | Status: AC
  Administered 2020-05-26: 2.5 mg via RESPIRATORY_TRACT
  Filled 2020-05-26: qty 3

## 2020-06-06 ENCOUNTER — Other Ambulatory Visit: Payer: Self-pay

## 2020-06-06 ENCOUNTER — Ambulatory Visit: Payer: BC Managed Care – PPO | Admitting: Family Medicine

## 2020-06-06 ENCOUNTER — Encounter: Payer: Self-pay | Admitting: Family Medicine

## 2020-06-06 VITALS — BP 145/75 | HR 73 | Temp 98.0°F | Ht 64.5 in | Wt 238.4 lb

## 2020-06-06 DIAGNOSIS — I10 Essential (primary) hypertension: Secondary | ICD-10-CM | POA: Diagnosis not present

## 2020-06-06 DIAGNOSIS — J4551 Severe persistent asthma with (acute) exacerbation: Secondary | ICD-10-CM

## 2020-06-06 MED ORDER — AMLODIPINE BESYLATE 10 MG PO TABS: 10.0000 mg | ORAL_TABLET | Freq: Every day | ORAL | 3 refills | Status: DC

## 2020-06-06 NOTE — Patient Instructions (Signed)
A referral to Cardiology has been placed today.  If you have not heard from the specialty office or our referral coordinator within 1 week, please let us know and we will follow up with the referral coordinator for an update.  Continue all medications as prescribed  CONTINUE losartan 50mg  once daily.    Some of the possible side effects are:   - angioedema: swelling of lips, mouth, and tongue.  If this rare side effect occurs, please go to ED. - cough: you could develop a dry, hacking cough caused by this medicine.  If it occurs, it will go away after stopping this medicine.  Call the clinic before stopping the medication. - kidney damage: we will monitor your labs when we start this medicine and at least one time per year.  If you do not have an change in kidney function when starting this medicine, it will provide kidney protection over time.  Contact Dr. office to confirm appointment for end of August  We will plan to see you back in 3 months for hypertension follow up visit  You will receive a survey after today's visit either digitally by e-mail or paper by USPS mail. Your experiences and feedback matter to September.  Please respond so we know how we are doing as we provide care for you.  Call us with any questions/concerns/needs.  It is my goal to be available to you for your health concerns.  Thanks for choosing me to be a partner in your healthcare needs!  [place], FNP-C Family Nurse Practitioner [facility] Phone: [phone]

## 2020-06-06 NOTE — Assessment & Plan Note (Signed)
Patient reports has been doing ok with his asthma, he recently stopped the trelegy for a few days, then restarted to see if this medication was causing his asthma.  Reports did this twice and found symptom improvement with stopping his trelegy.  Discussed with patient, it is important to follow his pulmonologist's treatment plan and if making changes on his medications that he should speak with their office to update.  Patient reports appointment at the end of August 2021, which I am unable to see in computer.  Encouraged patient to contact Dr. Georgann Housekeeper office to confirm appointment and update on his concerns with trelegy.  Plan: 1. Schedule f/u appt with Dr. Georgann Housekeeper office

## 2020-06-06 NOTE — Progress Notes (Signed)
Subjective:    Patient ID: Miguel Perez., male    DOB: 1991-10-11, 29 y.o.   MRN: [id number]  Miguel Perez. is a 29 y.o. male presenting on 06/06/2020 for Hypertension and Asthma   HPI   Hypertension - He is not checking BP at home or outside of clinic.    - Current medications: amlodipine 10mg  and losartan-HCTZ 50-12.5mg , tolerating well without side effects - He is not currently symptomatic. - Pt denies headache, lightheadedness, dizziness, changes in vision, chest tightness/pressure, palpitations, leg swelling, sudden loss of speech or loss of consciousness. - He  reports no regular exercise routine. - His diet is high in salt, high in fat, and high in carbohydrates.  Depression screen [facility] 2/9 05/06/2020 03/03/2020 01/03/2018  Decreased Interest 0 0 0  Down, Depressed, Hopeless 0 0 0  PHQ - 2 Score 0 0 0  Altered sleeping 0 - -  Tired, decreased energy 0 - -  Change in appetite 0 - -  Feeling bad or failure about yourself  0 - -  Trouble concentrating 0 - -  Moving slowly or fidgety/restless 0 - -  Suicidal thoughts 0 - -  PHQ-9 Score 0 - -  Difficult doing work/chores Not difficult at all - -    Social History   Tobacco Use  . Smoking status: Current Every Day Smoker    Packs/day: 1.00    Years: 10.00    Pack years: 10.00    Types: Cigarettes  . Smokeless tobacco: Never Used  . Tobacco comment: 1 pack a day 05/03/20 [facility]   Vaping Use  . Vaping Use: Never used  Substance Use Topics  . Alcohol use: Yes    Comment: occasionally  . Drug use: No    Review of Systems  Constitutional: Negative.   HENT: Negative.   Eyes: Negative.   Respiratory: Negative.   Cardiovascular: Negative.   Gastrointestinal: Negative.   Endocrine: Negative.   Genitourinary: Negative.   Musculoskeletal: Negative.   Skin: Negative.   Allergic/Immunologic: Negative.   Neurological: Negative.   Hematological: Negative.   Psychiatric/Behavioral: Negative.    Per HPI unless  specifically indicated above     Objective:    BP (!) 145/75 (BP Location: Left Arm, Patient Position: Sitting, Cuff Size: Large)   Pulse 73   Temp 98 F (36.7 C) (Oral)   Ht 5' 4.5" (1.638 m)   Wt 238 lb 6.4 oz (108.1 kg)   SpO2 98%   BMI 40.29 kg/m   Wt Readings from Last 3 Encounters:  06/06/20 238 lb 6.4 oz (108.1 kg)  05/23/20 239 lb 3.2 oz (108.5 kg)  05/06/20 233 lb (105.7 kg)    Physical Exam Vitals reviewed.  Constitutional:      General: He is not in acute distress.    Appearance: Normal appearance. He is well-developed and well-groomed. He is morbidly obese. He is not ill-appearing or toxic-appearing.  HENT:     Head: Normocephalic and atraumatic.     Nose:     Comments: 07/07/20 is in place, covering mouth and nose. Eyes:     General:        Right eye: No discharge.        Left eye: No discharge.     Extraocular Movements: Extraocular movements intact.     Conjunctiva/sclera: Conjunctivae normal.     Pupils: Pupils are equal, round, and reactive to light.  Cardiovascular:     Rate and Rhythm: Normal rate  and regular rhythm.     Pulses: Normal pulses.     Heart sounds: Normal heart sounds. No murmur heard.  No friction rub. No gallop.   Pulmonary:     Effort: Pulmonary effort is normal. No respiratory distress.     Breath sounds: Normal breath sounds.  Musculoskeletal:     Right lower leg: No edema.     Left lower leg: No edema.  Skin:    General: Skin is warm and dry.     Capillary Refill: Capillary refill takes less than 2 seconds.  Neurological:     General: No focal deficit present.     Mental Status: He is alert and oriented to person, place, and time.  Psychiatric:        Attention and Perception: Attention and perception normal.        Mood and Affect: Mood and affect normal.        Speech: Speech normal.        Behavior: Behavior normal. Behavior is cooperative.        Thought Content: Thought content normal.        Cognition and Memory:  Cognition and memory normal.    Results for orders placed or performed in visit on 05/26/20  Pulmonary Function Test [facility] Only  Result Value Ref Range   FVC-Pre 5.14 L   FVC-%Pred-Pre 111 %   FVC-Post 5.69 L   FVC-%Pred-Post 123 %   FVC-%Change-Post 10 %   FEV1-Pre 3.36 L   FEV1-%Pred-Pre 87 %   FEV1-Post 3.78 L   FEV1-%Pred-Post 98 %   FEV1-%Change-Post 12 %   FEV6-Pre 5.02 L   FEV6-%Pred-Pre 109 %   FEV6-Post 5.58 L   FEV6-%Pred-Post 121 %   FEV6-%Change-Post 11 %   Pre FEV1/FVC ratio 65 %   FEV1FVC-%Pred-Pre 79 %   Post FEV1/FVC ratio 66 %   FEV1FVC-%Change-Post 1 %   Pre FEV6/FVC Ratio 98 %   FEV6FVC-%Pred-Pre 98 %   Post FEV6/FVC ratio 98 %   FEV6FVC-%Pred-Post 98 %   FEV6FVC-%Change-Post 0 %   FEF 25-75 Pre 2.14 L/sec   FEF2575-%Pred-Pre 52 %   FEF 25-75 Post 3.03 L/sec   FEF2575-%Pred-Post 74 %   FEF2575-%Change-Post 41 %   RV 2.74 L   RV % pred 214 %   TLC 8.38 L   TLC % pred 144 %   DLCO unc 32.16 ml/min/mmHg   DLCO unc % pred 116 %   DL/VA 8.09 ml/min/mmHg/L   DL/VA % pred 85 %      Assessment & Plan:   Problem List Items Addressed This Visit      Cardiovascular and Mediastinum   Hypertension - Primary    Uncontrolled hypertension.  BP is not at goal < 130/80.  Pt is working on lifestyle modifications.  Taking medications tolerating well without side effects.   Complications: morbid obesity, uncontrolled asthma  Plan: 1. Continue taking losartan-HCTZ 50-12.5mg  and amlodipine 10mg  daily 2. Obtain labs in 6 months  3. Encouraged heart healthy diet and increasing exercise to 30 minutes most days of the week, going no more than 2 days in a row without exercise. 4. Check BP 1-2 x per week at home, keep log, and bring to clinic at next appointment. 5. Referral placed to cardiology for assistance with getting blood pressures under 130/80 6. Follow up 3 months.         Relevant Medications   amLODipine (NORVASC) 10 MG tablet   Other Relevant  Orders    Ambulatory referral to Cardiology     Respiratory   Severe persistent asthma    Patient reports has been doing ok with his asthma, he recently stopped the trelegy for a few days, then restarted to see if this medication was causing his asthma.  Reports did this twice and found symptom improvement with stopping his trelegy.  Discussed with patient, it is important to follow his pulmonologist's treatment plan and if making changes on his medications that he should speak with their office to update.  Patient reports appointment at the end of August 2021, which I am unable to see in computer.  Encouraged patient to contact Dr. Georgann Housekeeper office to confirm appointment and update on his concerns with trelegy.  Plan: 1. Schedule f/u appt with Dr. Georgann Housekeeper office         Meds ordered this encounter  Medications  . amLODipine (NORVASC) 10 MG tablet    Sig: Take 1 tablet (10 mg total) by mouth daily.    Dispense:  90 tablet    Refill:  3      Follow up plan: Return in about 3 months (around 09/06/2020) for HTN F/U.   Charlaine Dalton, FNP Family Nurse Practitioner [facility] 06/06/2020, 3:41 PM

## 2020-06-06 NOTE — Assessment & Plan Note (Signed)
Uncontrolled hypertension.  BP is not at goal < 130/80.  Pt is working on lifestyle modifications.  Taking medications tolerating well without side effects.   Complications: morbid obesity, uncontrolled asthma  Plan: 1. Continue taking losartan-HCTZ 50-12.5mg  and amlodipine 10mg  daily 2. Obtain labs in 6 months  3. Encouraged heart healthy diet and increasing exercise to 30 minutes most days of the week, going no more than 2 days in a row without exercise. 4. Check BP 1-2 x per week at home, keep log, and bring to clinic at next appointment. 5. Referral placed to cardiology for assistance with getting blood pressures under 130/80 6. Follow up 3 months.

## 2020-06-14 NOTE — Progress Notes (Signed)
Patient is likely on a "recall" for [place].  Our clinic has been exceedingly booked.  We will try to get him in for follow-up.

## 2020-06-16 ENCOUNTER — Telehealth: Payer: Self-pay

## 2020-06-16 NOTE — Telephone Encounter (Signed)
-----   Message from Salena Saner, MD sent at 06/14/2020  9:30 AM EDT -----   ----- Message ----- From: Tarri Fuller, FNP Sent: 06/06/2020   3:42 PM EDT To: Salena Saner, MD  Hi Dr. Jayme Cloud,    I encouraged Mr. Belfield to schedule f/u with your office.  He said he had an appt for end of Aug 2021 but I don't see in the computer.  He has been coming off of his trelegy, stating that makes his symptoms worse.  I told him to contact your office to update on his medication intake and if any changes in tx plan are needed.Hope you have a great week,Nicole

## 2020-06-16 NOTE — Telephone Encounter (Signed)
Left message for patient

## 2020-06-17 NOTE — Telephone Encounter (Signed)
Left message x2 for patient.  

## 2020-06-21 NOTE — Telephone Encounter (Signed)
Lm x3 for pt.  Letter has been mailed to address on file.  Will close encounter per office protocol.

## 2020-07-01 ENCOUNTER — Encounter: Payer: Self-pay | Admitting: Emergency Medicine

## 2020-07-01 ENCOUNTER — Other Ambulatory Visit: Payer: Self-pay

## 2020-07-01 ENCOUNTER — Ambulatory Visit
Admission: EM | Admit: 2020-07-01 | Discharge: 2020-07-01 | Disposition: A | Discharge status: Home / Self Care | Payer: BC Managed Care – PPO | Attending: Internal Medicine | Admitting: Internal Medicine

## 2020-07-01 DIAGNOSIS — I83893 Varicose veins of bilateral lower extremities with other complications: Secondary | ICD-10-CM | POA: Insufficient documentation

## 2020-07-01 DIAGNOSIS — I10 Essential (primary) hypertension: Secondary | ICD-10-CM | POA: Insufficient documentation

## 2020-07-01 DIAGNOSIS — R609 Edema, unspecified: Secondary | ICD-10-CM | POA: Insufficient documentation

## 2020-07-01 DIAGNOSIS — L03115 Cellulitis of right lower limb: Secondary | ICD-10-CM

## 2020-07-01 DIAGNOSIS — R0683 Snoring: Secondary | ICD-10-CM | POA: Diagnosis not present

## 2020-07-01 LAB — CBC WITH DIFFERENTIAL/PLATELET
Abs Immature Granulocytes: 0.03 10*3/uL (ref 0.00–0.07)
Basophils Absolute: 0 10*3/uL (ref 0.0–0.1)
Basophils Relative: 1 %
Eosinophils Absolute: 0.2 10*3/uL (ref 0.0–0.5)
Eosinophils Relative: 3 %
HCT: 46.3 % (ref 39.0–52.0)
Hemoglobin: 15.7 g/dL (ref 13.0–17.0)
Immature Granulocytes: 1 %
Lymphocytes Relative: 26 %
Lymphs Abs: 1.5 10*3/uL (ref 0.7–4.0)
MCH: 29.1 pg (ref 26.0–34.0)
MCHC: 33.9 g/dL (ref 30.0–36.0)
MCV: 85.9 fL (ref 80.0–100.0)
Monocytes Absolute: 0.5 10*3/uL (ref 0.1–1.0)
Monocytes Relative: 9 %
Neutro Abs: 3.6 10*3/uL (ref 1.7–7.7)
Neutrophils Relative %: 60 %
Platelets: 202 10*3/uL (ref 150–400)
RBC: 5.39 MIL/uL (ref 4.22–5.81)
RDW: 13.2 % (ref 11.5–15.5)
WBC: 5.9 10*3/uL (ref 4.0–10.5)
nRBC: 0 % (ref 0.0–0.2)

## 2020-07-01 LAB — COMPREHENSIVE METABOLIC PANEL
ALT: 43 U/L (ref 0–44)
AST: 33 U/L (ref 15–41)
Albumin: 4 g/dL (ref 3.5–5.0)
Alkaline Phosphatase: 46 U/L (ref 38–126)
Anion gap: 11 (ref 5–15)
BUN: 18 mg/dL (ref 6–20)
CO2: 25 mmol/L (ref 22–32)
Calcium: 8.7 mg/dL — ABNORMAL LOW (ref 8.9–10.3)
Chloride: 101 mmol/L (ref 98–111)
Creatinine, Ser: 0.85 mg/dL (ref 0.61–1.24)
GFR calc Af Amer: >60 mL/min (ref >60)
GFR calc non Af Amer: >60 mL/min (ref >60)
Glucose, Bld: 107 mg/dL — ABNORMAL HIGH (ref 70–99)
Potassium: 4.3 mmol/L (ref 3.5–5.1)
Sodium: 137 mmol/L (ref 135–145)
Total Bilirubin: 0.7 mg/dL (ref 0.3–1.2)
Total Protein: 7.5 g/dL (ref 6.5–8.1)

## 2020-07-01 LAB — GAMMA GT: GGT: 64 U/L — ABNORMAL HIGH (ref 7–50)

## 2020-07-01 MED ORDER — HYDROCHLOROTHIAZIDE 12.5 MG PO TABS: 12.5000 mg | ORAL_TABLET | Freq: Every day | ORAL | 0 refills | Status: DC

## 2020-07-01 MED ORDER — CEPHALEXIN 500 MG PO CAPS: ORAL_CAPSULE | ORAL | 0 refills | Status: DC

## 2020-07-01 NOTE — Discharge Instructions (Addendum)
Your chemistry and cell count are normal except your calcium is a little low, so consume more foods like dairy to increase back to normal.  Stop drinking as much and the most a men can drink a week is 14 drinks max, but no all in one day. I recommend since you have a lot of varicose veins you get support socks to below your knee and wear them when you are standing all the time. They can also cause swelling.  I am placing you on Keflex antibiotic in case the redness on your right shin is from an infection.  I am adding a water pill to take in the mornings to help with the swelling, so make sure you have a banana or potatoes to make sure your potasium does not drop.  Follow up with you family Dr about your edema and make sure he/she knows you stops breathing in your sleep

## 2020-07-01 NOTE — ED Provider Notes (Addendum)
[facility]    CSN: [id number] Arrival date & time: 07/01/20  1027      History   Chief Complaint Chief Complaint  Patient presents with  . Leg Swelling    HPI Miguel Perez. is a 29 y.o. male. who presents with onset of bilateral ankle and feet swelling since last night. He works standing up, but indoors. Was started on Norvasc 8/2 and has been tolerating this fine and did not have swelling til last night. He also snores and is working on wt loss and PCP is holding off from ordering a sleep test. His wife has told him he snores at stops breathing at time. He admits he drinks 8 beers a day and more on the weekends and his PCP knows this. He has not been working on quitting. Denies CP, SOB or PND. Has never had COVID. Denies calf pain. Has noticed rash on his R shin today, it is not painful or itching.     Past Medical History:  Diagnosis Date  . Asthma     Patient Active Problem List   Diagnosis Date Noted  . Elevated liver enzymes 05/24/2020  . Morbid obesity (HCC) 03/17/2020  . Severe persistent asthma 03/03/2020  . Hypertension 03/03/2020  . Severe persistent allergic asthma without complication 12/29/2018    Past Surgical History:  Procedure Laterality Date  . HERNIA REPAIR         Home Medications    Prior to Admission medications   Medication Sig Start Date End Date Taking? Authorizing Provider  albuterol (VENTOLIN HFA) 108 (90 Base) MCG/ACT inhaler INHALE 1 TO 2 PUFFS BY MOUTH EVERY 6 HOURS AS NEEDED FOR WHEEZING OR SHORTNESS OF BREATH 03/03/20  Yes Malfi, Jodelle Gross, FNP  amLODipine (NORVASC) 10 MG tablet Take 1 tablet (10 mg total) by mouth daily. 06/06/20  Yes Malfi, Jodelle Gross, FNP  losartan-hydrochlorothiazide (HYZAAR) 50-12.5 MG tablet Take 1 tablet by mouth daily. 05/06/20  Yes Malfi, Jodelle Gross, FNP  montelukast (SINGULAIR) 10 MG tablet Take 1 tablet (10 mg total) by mouth at bedtime. 03/03/20  Yes Malfi, Jodelle Gross, FNP  pantoprazole (PROTONIX) 40  MG tablet Take 1 tablet by mouth daily 05/03/20  Yes Salena Saner, MD  cephALEXin [facility]) 500 MG capsule 2 bid x 7 days 07/01/20   Rodriguez-Southworth, Nettie Elm, PA-C  Fluticasone-Umeclidin-Vilant (TRELEGY ELLIPTA) 100-62.5-25 MCG/INH AEPB Inhale 1 puff into the lungs daily. 05/03/20   Salena Saner, MD  hydrochlorothiazide (HYDRODIURIL) 12.5 MG tablet Take 1 tablet (12.5 mg total) by mouth daily. To add to his current losartan-HCTZ for edema 07/01/20   Rodriguez-Southworth, Nettie Elm, PA-C    Family History Family History  Problem Relation Age of Onset  . Hypertension Mother     Social History Social History   Tobacco Use  . Smoking status: Current Every Day Smoker    Packs/day: 1.00    Years: 10.00    Pack years: 10.00    Types: Cigarettes  . Smokeless tobacco: Never Used  . Tobacco comment: 1 pack a day 05/03/20 [facility]   Vaping Use  . Vaping Use: Never used  Substance Use Topics  . Alcohol use: Yes    Comment: 8 beers per day and more on the weekends  . Drug use: No     Allergies   Patient has no known allergies.   Review of Systems Review of Systems  Constitutional: Negative for activity change, appetite change, chills, diaphoresis, fatigue and fever.  HENT: Negative for  congestion.   Respiratory: Positive for apnea. Negative for cough, chest tightness and shortness of breath.        + snores  Cardiovascular: Positive for leg swelling. Negative for chest pain.  Gastrointestinal: Negative for abdominal pain, blood in stool, diarrhea, nausea and vomiting.  Genitourinary: Negative for difficulty urinating.  Musculoskeletal: Negative for gait problem, joint swelling and myalgias.  Skin: Positive for rash. Negative for wound.     Physical Exam Triage Vital Signs ED Triage Vitals  Enc Vitals Group     BP 07/01/20 1039 (!) 150/93     Pulse Rate 07/01/20 1039 71     Resp 07/01/20 1039 16     Temp 07/01/20 1039 98.2 F (36.8 C)     Temp Source 07/01/20 1039 Oral       SpO2 07/01/20 1039 98 %     Weight 07/01/20 1036 240 lb (108.9 kg)     Height 07/01/20 1036 5\' 4"  (1.626 m)     Head Circumference --      Peak Flow --      Pain Score 07/01/20 1036 0     Pain Loc --      Pain Edu? --      Excl. in GC? --    No data found.  Updated Vital Signs BP (!) 150/93 (BP Location: Right Arm) Comment: Patient states that he has not taken his BP medicines today.  Pulse 71   Temp 98.2 F (36.8 C) (Oral)   Resp 16   Ht 5\' 4"  (1.626 m)   Wt 240 lb (108.9 kg)   SpO2 98%   BMI 41.20 kg/m   Visual Acuity Right Eye Distance:   Left Eye Distance:   Bilateral Distance:    Right Eye Near:   Left Eye Near:    Bilateral Near:     Physical Exam Vitals and nursing note reviewed.  Constitutional:      General: He is not in acute distress.    Appearance: He is obese. He is not toxic-appearing.  HENT:     Head: Atraumatic.     Right Ear: External ear normal.     Left Ear: External ear normal.     Nose: Nose normal.  Eyes:     General: No scleral icterus.    Extraocular Movements: Extraocular movements intact.     Conjunctiva/sclera: Conjunctivae normal.  Cardiovascular:     Rate and Rhythm: Normal rate and regular rhythm.     Pulses: Normal pulses.     Heart sounds: Normal heart sounds. No murmur heard.   Pulmonary:     Effort: Pulmonary effort is normal.     Breath sounds: Normal breath sounds.  Abdominal:     General: Bowel sounds are normal.     Palpations: Abdomen is soft. There is no mass.     Tenderness: There is no abdominal tenderness. There is no guarding or rebound.     Hernia: No hernia is present.  Musculoskeletal:        General: No tenderness or signs of injury. Normal range of motion.     Cervical back: Neck supple.     Right lower leg: Edema present.     Left lower leg: Edema present.     Comments: Has multiple varicosities on both feet, ankles and lower legs. Has +1/4 pitting edema of both ankles and up tp R lower shin. No  calf cords or tenderness or asymmetry of calf's noted.   Skin:  General: Skin is warm and dry.     Findings: No rash.     Comments: Redness on R mid shin with is across it and about 1.5 in wide feels warmer than the rest of his leg.   Neurological:     Mental Status: He is alert and oriented to person, place, and time.     Motor: No weakness.     Gait: Gait normal.  Psychiatric:        Mood and Affect: Mood normal.        Behavior: Behavior normal.        Thought Content: Thought content normal.        Judgment: Judgment normal.    UC Treatments / Results  Labs (all labs ordered are listed, but only abnormal results are displayed) Labs Reviewed  COMPREHENSIVE METABOLIC PANEL - Abnormal; Notable for the following components:      Result Value   Glucose, Bld 107 (*)    Calcium 8.7 (*)    All other components within normal limits  CBC WITH DIFFERENTIAL/PLATELET  GAMMA GT    EKG   Radiology No results found.  Procedures Procedures (including critical care time)  Medications Ordered in UC Medications - No data to display  Initial Impression / Assessment and Plan / UC Course  I have reviewed the triage vital signs and the nursing notes. Has bilateral edema and possibly early cellulitis of R shin. His CMP and CBC are normal other than slight low calcium.  I added HCTZ 12.5 mg to his current meds for 3 days, and needs to Fu with his PCP. See instructions.  Pertinent labs results that were available during my care of the patient were reviewed by me and considered in my medical decision making (see chart for details).  Final Clinical Impressions(s) / UC Diagnoses   Final diagnoses:  Varicose veins of bilateral lower extremities with other complications  Cellulitis of right lower extremity  Edema, unspecified type  Hypertension, unspecified type  Snoring     Discharge Instructions     Your chemistry and cell count are normal except your calcium is a little low, so  consume more foods like dairy to increase back to normal.  Stop drinking as much and the most a men can drink a week is 14 drinks max, but no all in one day. I recommend since you have a lot of varicose veins you get support socks to below your knee and wear them when you are standing all the time. They can also cause swelling.  I am placing you on Keflex antibiotic in case the redness on your right shin is from an infection.  I am adding a water pill to take in the mornings to help with the swelling, so make sure you have a banana or potatoes to make sure your potasium does not drop.  Follow up with you family Dr about your edema and make sure he/she knows you stops breathing in your sleep     ED Prescriptions    Medication Sig Dispense Auth. Provider   cephALEXin (KEFLEX) 500 MG capsule 2 bid x 7 days 28 capsule Rodriguez-Southworth, Taisia Fantini, PA-C   hydrochlorothiazide (HYDRODIURIL) 12.5 MG tablet Take 1 tablet (12.5 mg total) by mouth daily. To add to his current losartan-HCTZ for edema 3 tablet Rodriguez-Southworth, Nettie Elm, PA-C     PDMP not reviewed this encounter.   Garey Ham, PA-C 07/01/20 1202    Rodriguez-Southworth, [place], PA-C 07/01/20 1203

## 2020-07-01 NOTE — ED Triage Notes (Signed)
Patient c/o bilateral lower leg swelling that started late yesterday.  Patient denies injury or fall.

## 2020-07-04 ENCOUNTER — Ambulatory Visit: Payer: BC Managed Care – PPO | Admitting: Pulmonary Disease

## 2020-07-04 ENCOUNTER — Encounter: Payer: Self-pay | Admitting: Pulmonary Disease

## 2020-07-04 ENCOUNTER — Ambulatory Visit
Admission: RE | Admit: 2020-07-04 | Discharge: 2020-07-04 | Disposition: A | Discharge status: Home / Self Care | Payer: BC Managed Care – PPO | Source: Ambulatory Visit | Attending: Pulmonary Disease | Admitting: Pulmonary Disease

## 2020-07-04 ENCOUNTER — Other Ambulatory Visit: Payer: Self-pay

## 2020-07-04 ENCOUNTER — Other Ambulatory Visit
Admission: RE | Admit: 2020-07-04 | Discharge: 2020-07-04 | Disposition: A | Discharge status: Home / Self Care | Payer: BC Managed Care – PPO | Source: Ambulatory Visit | Attending: Pulmonary Disease | Admitting: Pulmonary Disease

## 2020-07-04 VITALS — BP 130/76 | HR 78 | Temp 97.1°F | Ht 64.0 in | Wt 246.2 lb

## 2020-07-04 DIAGNOSIS — Z9189 Other specified personal risk factors, not elsewhere classified: Secondary | ICD-10-CM | POA: Diagnosis not present

## 2020-07-04 DIAGNOSIS — J4551 Severe persistent asthma with (acute) exacerbation: Secondary | ICD-10-CM

## 2020-07-04 DIAGNOSIS — F17200 Nicotine dependence, unspecified, uncomplicated: Secondary | ICD-10-CM | POA: Insufficient documentation

## 2020-07-04 DIAGNOSIS — Z0000 Encounter for general adult medical examination without abnormal findings: Secondary | ICD-10-CM

## 2020-07-04 DIAGNOSIS — K219 Gastro-esophageal reflux disease without esophagitis: Secondary | ICD-10-CM | POA: Insufficient documentation

## 2020-07-04 DIAGNOSIS — J45909 Unspecified asthma, uncomplicated: Secondary | ICD-10-CM | POA: Diagnosis not present

## 2020-07-04 MED ORDER — BUDESONIDE-FORMOTEROL FUMARATE 80-4.5 MCG/ACT IN AERO: 2.0000 | INHALATION_SPRAY | Freq: Two times a day (BID) | RESPIRATORY_TRACT | 5 refills | Status: DC

## 2020-07-04 NOTE — Assessment & Plan Note (Signed)
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

## 2020-07-04 NOTE — Assessment & Plan Note (Signed)
BMI 42 Mallampati 1 Observe snoring and suspected apnea episodes  Plan: Home sleep study ordered

## 2020-07-04 NOTE — Patient Instructions (Addendum)
You were seen today by Coral Ceo, NP  for:    1. Severe persistent asthma with acute exacerbation  Lab work  Chest x-ray today  Start Symbicort 80 >>> 2 puffs in the morning right when you wake up, rinse out your mouth after use, 12 hours later 2 puffs, rinse after use >>> Take this daily, no matter what >>> This is not a rescue inhaler   Only use your albuterol as a rescue medication to be used if you can't catch your breath by resting or doing a relaxed purse lip breathing pattern.  - The less you use it, the better it will work when you need it. - Ok to use up to 2 puffs  every 4 hours if you must but call for immediate appointment if use goes up over your usual need - Don't leave home without it !!  (think of it like the spare tire for your car)   It is extremely important that you stop smoking   2. Healthcare maintenance  We recommend that you obtain the seasonal flu vaccine in fall/2021  You are due for the Pneumovax 23, we offered this to you today and you declined it  We recommend that you obtain the COVID-19 vaccine as you are at high risk for pulmonary complications if you did contract the COVID-19 virus  3. Smoker   We recommend that you stop smoking.  >>>You need to set a quit date >>>If you have friends or family who smoke, let them know you are trying to quit and not to smoke around you or in your living environment  Smoking Cessation Resources:  1 800 QUIT NOW  >>> Patient to call this resource and utilize it to help support her quit smoking >>> Keep up your hard work with stopping smoking  You can also contact the [facility] >>>For smoking cessation classes call [phone]  We do not recommend using e-cigarettes as a form of stopping smoking  You can sign up for smoking cessation support texts and information:  >>>https://smokefree.gov/smokefreetxt  4. At Risk for OSA   We will order home sleep study   Follow Up:    Return in  about 2 months (around 09/03/2020), or if symptoms worsen or fail to improve, for [facility] - Dr. Jayme Cloud.   Please do your part to reduce the spread of COVID-19:      Reduce your risk of any infection  and COVID19 by using the similar precautions used for avoiding the common cold or flu:  Marland Kitchen Wash your hands often with soap and warm water for at least 20 seconds.  If soap and water are not readily available, use an alcohol-based hand sanitizer with at least 60% alcohol.  . If coughing or sneezing, cover your mouth and nose by coughing or sneezing into the elbow areas of your shirt or coat, into a tissue or into your sleeve (not your hands). Drinda Butts A MASK when in public  . Avoid shaking hands with others and consider head nods or verbal greetings only. . Avoid touching your eyes, nose, or mouth with unwashed hands.  . Avoid close contact with people who are sick. . Avoid places or events with large numbers of people in one location, like concerts or sporting events. . If you have some symptoms but not all symptoms, continue to monitor at home and seek medical attention if your symptoms worsen. . If you are having a medical emergency, call 911.  ADDITIONAL HEALTHCARE OPTIONS FOR PATIENTS  [facility] / e-Visit: [email or web address]         [facility]: [phone]  [facility]: [phone]                   [facility]: [phone]     It is flu season:   >>> Best ways to protect herself from the flu: Receive the yearly flu vaccine, practice good hand hygiene washing with soap and also using hand sanitizer when available, eat a nutritious meals, get adequate rest, hydrate appropriately   Please contact the office if your symptoms worsen or you have concerns that you are not improving.   Thank you for choosing [facility] for your healthcare, and for allowing [place] to  partner with you on your healthcare journey. I am thankful to be able to provide care to you today.   Elisha Headland FNP-C     Health Risks of Smoking Smoking cigarettes is very bad for your health. Tobacco smoke has over 200 known poisons in it. It contains the poisonous gases nitrogen oxide and carbon monoxide. There are over 60 chemicals in tobacco smoke that cause cancer. Smoking is difficult to quit because a chemical in tobacco, called nicotine, causes addiction or dependence. When you smoke and inhale, nicotine is absorbed rapidly into the bloodstream through your lungs. Both inhaled and non-inhaled nicotine may be addictive. What are the risks of cigarette smoke? Cigarette smokers have an increased risk of many serious medical problems, including:  Lung cancer.  Lung disease, such as pneumonia, bronchitis, and emphysema.  Chest pain (angina) and heart attack because the heart is not getting enough oxygen.  Heart disease and peripheral blood vessel disease.  High blood pressure (hypertension).  Stroke.  Oral cancer, including cancer of the lip, mouth, or voice box.  Bladder cancer.  Pancreatic cancer.  Cervical cancer.  Pregnancy complications, including premature birth.  Stillbirths and smaller newborn babies, birth defects, and genetic damage to sperm.  Early menopause.  Lower estrogen level for women.  Infertility.  Facial wrinkles.  Blindness.  Increased risk of broken bones (fractures).  Senile dementia.  Stomach ulcers and internal bleeding.  Delayed wound healing and increased risk of complications during surgery.  Even smoking lightly shortens your life expectancy by several years. Because of secondhand smoke exposure, children of smokers have an increased risk of the following:  Sudden infant death syndrome (SIDS).  Respiratory infections.  Lung cancer.  Heart disease.  Ear infections. What are the benefits of quitting? There are many  health benefits of quitting smoking. Here are some of them:  Within days of quitting smoking, your risk of having a heart attack decreases, your blood flow improves, and your lung capacity improves. Blood pressure, pulse rate, and breathing patterns start returning to normal soon after quitting.  Within months, your lungs may clear up completely.  Quitting for 10 years reduces your risk of developing lung cancer and heart disease to almost that of a nonsmoker.  People who quit may see an improvement in their overall quality of life. How do I quit smoking?     Smoking is an addiction with both physical and psychological effects, and longtime habits can be hard to change. Your health care provider can recommend:  Programs and community resources, which may include group support, education, or talk therapy.  Prescription medicines to help reduce cravings.  Nicotine replacement products, such as patches, gum, and nasal  sprays. Use these products only as directed. Do not replace cigarette smoking with electronic cigarettes, which are commonly called e-cigarettes. The safety of e-cigarettes is not known, and some may contain harmful chemicals.  A combination of two or more of these methods. Where to find more information  American Lung Association: www.lung.org  American Cancer Society: www.cancer.org Summary  Smoking cigarettes is very bad for your health. Cigarette smokers have an increased risk of many serious medical problems, including several cancers, heart disease, and stroke.  Smoking is an addiction with both physical and psychological effects, and longtime habits can be hard to change.  By stopping right away, you can greatly reduce the risk of medical problems for you and your family.  To help you quit smoking, your health care provider can recommend programs, community resources, prescription medicines, and nicotine replacement products such as patches, gum, and nasal  sprays. This information is not intended to replace advice given to you by your health care provider. Make sure you discuss any questions you have with your health care provider. Document Revised: 01/23/2018 Document Reviewed: 10/26/2016 Elsevier Patient Education  2020 [employer].

## 2020-07-04 NOTE — Assessment & Plan Note (Signed)
Current smoker Smoking 1 pack/day 10-pack-year smoking history  Plan: Emphasized need to stop smoking

## 2020-07-04 NOTE — Assessment & Plan Note (Signed)
Plan: Emphasized need for the patient to work on reducing his BMI Goal should be patient losing around 20% of his current weight as this is the equivalent of starting an inhaler

## 2020-07-04 NOTE — Assessment & Plan Note (Signed)
Plan: Continue Protonix Follow GERD lifestyle recommendations

## 2020-07-04 NOTE — Progress Notes (Signed)
@Patient  ID: ., male    DOB: 1991-07-02, 29 y.o.   MRN: [id number]  Chief Complaint  Patient presents with  . Follow-up    Breathing is about the same. He stopped trelegy approx 1 month ago due to cough. He is using his albuterol inhaler 1-2 x per wk on average.     Referring provider: [id number], FNP  HPI:  29 year old male current everyday smoker followed in our office for shortness of breath.  PMH: Hypertension, morbid obesity, fatty liver disease Smoker/ Smoking History: Current smoker.  1 pack/day.  10-pack-year smoking history. Maintenance: Trelegy Ellipta Pt of: Dr. 26  07/04/2020  - Visit   29 year old male current everyday smoker followed in our office for dyspnea on exertion.  Patient was last seen in June/2021.  Seen by Dr. July/2021.  Patient was switched to Trelegy Ellipta.  Pulmonary function testing has been ordered.  Is recommended that his ACE inhibitor be stopped.  Need to consider a sleep study in the future.  In August/2021 patient's primary care provider contacted our office notifying September/2021 that patient was coming on and off of his maintenance inhalers.  She is requesting that he have a follow-up with our office.  Patient was also seen in the emergency room on 07/01/2020.  It is felt that time the patient had edema and cellulitis of the right lower extremity.  He was placed on Keflex.  And started on hydrochlorothiazide.  Patient reports that ever since starting his Trelegy Ellipta he had a worsening cough.  We will discuss and evaluate this today.  Pulmonary function testing completed in July showed a obstruction.    Questionaires / Pulmonary Flowsheets:   ACT:  No flowsheet data found.  MMRC: No flowsheet data found.  Epworth:  No flowsheet data found.  Tests:   05/26/2020-FVC-5.14 (111% predicted), postbronchodilator ratio 66, postbronchodilator FEV1 3.78 (98% predicted), positive bronchodilator response in FEV1, TLC 8.38 (144%  predicted) DLCO 32.16 (116% predicted)  02/02/2017-chest x-ray-central bronchial thickening, streaky opacities in left lung may be atelectasis versus early pneumonia  FENO:  No results found for: NITRICOXIDE  PFT: PFT Results Latest Ref Rng & Units 05/26/2020  FVC-Pre L 5.14  FVC-Predicted Pre % 111  FVC-Post L 5.69  FVC-Predicted Post % 123  Pre FEV1/FVC % % 65  Post FEV1/FCV % % 66  FEV1-Pre L 3.36  FEV1-Predicted Pre % 87  FEV1-Post L 3.78  DLCO uncorrected ml/min/mmHg 32.16  DLCO UNC% % 116  DLVA Predicted % 85  TLC L 8.38  TLC % Predicted % 144  RV % Predicted % 214    WALK:  No flowsheet data found.  Imaging: No results found.  Lab Results:  CBC    Component Value Date/Time   WBC 5.9 07/01/2020 1104   RBC 5.39 07/01/2020 1104   HGB 15.7 07/01/2020 1104   HCT 46.3 07/01/2020 1104   PLT 202 07/01/2020 1104   MCV 85.9 07/01/2020 1104   MCH 29.1 07/01/2020 1104   MCHC 33.9 07/01/2020 1104   RDW 13.2 07/01/2020 1104   LYMPHSABS 1.5 07/01/2020 1104   MONOABS 0.5 07/01/2020 1104   EOSABS 0.2 07/01/2020 1104   BASOSABS 0.0 07/01/2020 1104    BMET    Component Value Date/Time   NA 137 07/01/2020 1104   K 4.3 07/01/2020 1104   CL 101 07/01/2020 1104   CO2 25 07/01/2020 1104   GLUCOSE 107 (H) 07/01/2020 1104   BUN 18 07/01/2020 1104  CREATININE 0.85 07/01/2020 1104   CREATININE 1.02 05/23/2020 1533   CALCIUM 8.7 (L) 07/01/2020 1104   GFRNONAA >60 07/01/2020 1104   GFRNONAA 100 05/23/2020 1533   GFRAA >60 07/01/2020 1104   GFRAA 115 05/23/2020 1533    BNP No results found for: BNP  ProBNP No results found for: PROBNP  Specialty Problems      Pulmonary Problems   Severe persistent allergic asthma without complication   Severe persistent asthma      No Known Allergies  Immunization History  Administered Date(s) Administered  . Influenza,inj,Quad PF,6+ Mos 08/08/2018  . Tdap 12/29/2018   Recommend seasonal flu vaccine, recommended  Pneumovax 23, patient declined Recommended COVID-19 vaccines, patient declined  Past Medical History:  Diagnosis Date  . Asthma     Tobacco History: Social History   Tobacco Use  Smoking Status Current Every Day Smoker  . Packs/day: 1.00  . Years: 11.00  . Pack years: 11.00  . Types: Cigarettes  Smokeless Tobacco Never Used   Ready to quit: Not Answered Counseling given: Not Answered  Smoking assessment and cessation counseling  Patient currently smoking: 1 ppd I have advised the patient to quit/stop smoking as soon as possible due to high risk for multiple medical problems.  It will also be very difficult for us to manage patient's  respiratory symptoms and status if we continue to expose her lungs to a known irritant.  We do not advise e-cigarettes as a form of stopping smoking.  Patient is not willing to quit smoking.  I have advised the patient that we can assist and have options of nicotine replacement therapy, provided smoking cessation education today, provided smoking cessation counseling, and provided cessation resources.  Follow-up next office visit office visit for assessment of smoking cessation.    Smoking cessation counseling advised for: 4 min   Outpatient Encounter Medications as of 07/04/2020  Medication Sig  . albuterol (VENTOLIN HFA) 108 (90 Base) MCG/ACT inhaler Inhale 2 puffs into the lungs every 6 (six) hours as needed for wheezing or shortness of breath.  Marland Kitchen. amLODipine (NORVASC) 10 MG tablet Take 1 tablet (10 mg total) by mouth daily.  . cephALEXin (KEFLEX) 500 MG capsule 2 bid x 7 days  . hydrochlorothiazide (HYDRODIURIL) 12.5 MG tablet Take 1 tablet (12.5 mg total) by mouth daily. To add to his current losartan-HCTZ for edema  . losartan-hydrochlorothiazide (HYZAAR) 50-12.5 MG tablet Take 1 tablet by mouth daily.  . montelukast (SINGULAIR) 10 MG tablet Take 1 tablet (10 mg total) by mouth at bedtime.  . pantoprazole (PROTONIX) 40 MG tablet Take 1  tablet by mouth daily  . budesonide-formoterol (SYMBICORT) 80-4.5 MCG/ACT inhaler Inhale 2 puffs into the lungs in the morning and at bedtime.  . [DISCONTINUED] albuterol (VENTOLIN HFA) 108 (90 Base) MCG/ACT inhaler INHALE 1 TO 2 PUFFS BY MOUTH EVERY 6 HOURS AS NEEDED FOR WHEEZING OR SHORTNESS OF BREATH  . [DISCONTINUED] Fluticasone-Umeclidin-Vilant (TRELEGY ELLIPTA) 100-62.5-25 MCG/INH AEPB Inhale 1 puff into the lungs daily.   No facility-administered encounter medications on file as of 07/04/2020.     Review of Systems  Review of Systems  Constitutional: Negative for activity change, chills, fatigue, fever and unexpected weight change.  HENT: Positive for congestion. Negative for postnasal drip, rhinorrhea, sinus pressure, sinus pain and sore throat.   Eyes: Negative.   Respiratory: Positive for cough. Negative for shortness of breath and wheezing.   Cardiovascular: Negative for chest pain and palpitations.  Gastrointestinal: Negative for constipation, diarrhea, nausea  and vomiting.       Occasional reflux - 3-4x a week   Endocrine: Negative.   Genitourinary: Negative.   Musculoskeletal: Negative.   Skin: Negative.   Neurological: Negative for dizziness and headaches.  Psychiatric/Behavioral: Positive for sleep disturbance. Negative for dysphoric mood. The patient is not nervous/anxious.   All other systems reviewed and are negative.    Physical Exam  BP 130/76 (BP Location: Left Arm, Cuff Size: Normal)   Pulse 78   Temp (!) 97.1 F (36.2 C) (Temporal)   Ht 5\' 4"  (1.626 m)   Wt 246 lb 3.2 oz (111.7 kg)   SpO2 97% Comment: on RA  BMI 42.26 kg/m   Wt Readings from Last 5 Encounters:  07/04/20 246 lb 3.2 oz (111.7 kg)  07/01/20 240 lb (108.9 kg)  06/06/20 238 lb 6.4 oz (108.1 kg)  05/23/20 239 lb 3.2 oz (108.5 kg)  05/06/20 233 lb (105.7 kg)    BMI Readings from Last 5 Encounters:  07/04/20 42.26 kg/m  07/01/20 41.20 kg/m  06/06/20 40.29 kg/m  05/23/20 40.42  kg/m  05/06/20 39.38 kg/m     Physical Exam Vitals and nursing note reviewed.  Constitutional:      General: He is not in acute distress.    Appearance: Normal appearance. He is obese.  HENT:     Head: Normocephalic and atraumatic.     Right Ear: Hearing and external ear normal.     Left Ear: Hearing and external ear normal.     Nose: Congestion and rhinorrhea present. No mucosal edema.     Right Turbinates: Not enlarged.     Left Turbinates: Not enlarged.     Mouth/Throat:     Mouth: Mucous membranes are dry.     Pharynx: Oropharynx is clear. No oropharyngeal exudate.     Comments: mallampati 1  Eyes:     Pupils: Pupils are equal, round, and reactive to light.  Cardiovascular:     Rate and Rhythm: Normal rate and regular rhythm.     Pulses: Normal pulses.     Heart sounds: Normal heart sounds. No murmur heard.   Pulmonary:     Effort: Pulmonary effort is normal.     Breath sounds: Wheezing present. No decreased breath sounds or rales.  Musculoskeletal:     Cervical back: Normal range of motion.     Right lower leg: No edema.     Left lower leg: No edema.  Lymphadenopathy:     Cervical: No cervical adenopathy.  Skin:    General: Skin is warm and dry.     Capillary Refill: Capillary refill takes less than 2 seconds.     Findings: No erythema or rash.  Neurological:     General: No focal deficit present.     Mental Status: He is alert and oriented to person, place, and time.     Motor: No weakness.     Coordination: Coordination normal.     Gait: Gait is intact. Gait normal.  Psychiatric:        Mood and Affect: Mood normal.        Behavior: Behavior normal. Behavior is cooperative.        Thought Content: Thought content normal.        Judgment: Judgment normal.       Assessment & Plan:   Severe persistent asthma Pulmonary function testing showing a postbronchodilator obstruction Positive bronchodilator response Patient still maintained on  Singulair Suspect patient's cough may been worsened  by DPI of Ellipta device  Plan: Start Symbicort 80, will start low ICS given history of cough, can always increase to Symbicort 160 if patient feels clinical relief Continue rescue inhaler Emphasized the patient needs to stop smoking Recommended Pneumovax 23, patient declined Recommend seasonal flu vaccine in fall/2021 Recommended patient obtain COVID-19 vaccinations  Morbid obesity (HCC) Plan: Emphasized need for the patient to work on reducing his BMI Goal should be patient losing around 20% of his current weight as this is the equivalent of starting an inhaler  GERD (gastroesophageal reflux disease) Plan: Continue Protonix Follow GERD lifestyle recommendations  Smoker Current smoker Smoking 1 pack/day 10-pack-year smoking history  Plan: Emphasized need to stop smoking  At risk for obstructive sleep apnea BMI 42 Mallampati 1 Observe snoring and suspected apnea episodes  Plan: Home sleep study ordered    Return in about 2 months (around 09/03/2020), or if symptoms worsen or fail to improve, for [facility] - Dr. Jayme Cloud.   Coral Ceo, NP 07/04/2020   This appointment required 32 minutes of patient care (this includes precharting, chart review, review of results, face-to-face care, etc.).

## 2020-07-05 NOTE — Progress Notes (Signed)
I called and left a detailed msg with results and recs per Miguel Perez ok per pt DPR.

## 2020-07-06 LAB — ALPHA-1 ANTITRYPSIN PHENOTYPE: A-1 Antitrypsin, Ser: 97 mg/dL (ref 95–164)

## 2020-07-08 NOTE — Progress Notes (Signed)
Spoke with pt and notified of results per Brian. Pt verbalized understanding and denied any questions. °

## 2020-07-18 ENCOUNTER — Other Ambulatory Visit: Payer: Self-pay

## 2020-07-18 ENCOUNTER — Encounter: Payer: Self-pay | Admitting: Cardiology

## 2020-07-18 ENCOUNTER — Ambulatory Visit: Payer: BC Managed Care – PPO | Admitting: Cardiology

## 2020-07-18 VITALS — BP 132/98 | HR 65 | Ht 64.0 in | Wt 249.1 lb

## 2020-07-18 DIAGNOSIS — F17200 Nicotine dependence, unspecified, uncomplicated: Secondary | ICD-10-CM

## 2020-07-18 DIAGNOSIS — I10 Essential (primary) hypertension: Secondary | ICD-10-CM

## 2020-07-18 DIAGNOSIS — R0683 Snoring: Secondary | ICD-10-CM | POA: Diagnosis not present

## 2020-07-18 DIAGNOSIS — R062 Wheezing: Secondary | ICD-10-CM

## 2020-07-18 NOTE — Progress Notes (Signed)
Cardiology Office Note:    Date:  07/18/2020   ID:  Binnie Rail., DOB 12-04-1990, MRN [id number]  PCP:  Tarri Fuller, FNP  [facility] Cardiologist:  Debbe Odea, MD  [facility] Electrophysiologist:  None   Referring MD: Tarri Fuller, FNP   Chief Complaint  Patient presents with  . other    HTN. Meds reviewed verbally with pt.   Miguel Perez. is a 29 y.o. male who is being seen today for the evaluation of hypertension at the request of Anitra Lauth, Jodelle Gross, FNP.   History of Present Illness:    Miguel Perez. is a 29 y.o. male with a hx of asthma, hypertension, current smoker x10 years who presents due to hypertension.  Patient states being diagnosed with hypertension about 6 months ago.  He was started on BP meds.  HCTZ was recently added due to uncontrolled blood pressures.  He denies any history of heart disease.  Denies chest pain, endorses shortness of breath when he overexerts himself.  Denies palpitations, presyncope or syncope.  Endorses eating a lot of salt.  He has cut back on his drinking.  He does not remember what medication he takes at home but endorses taking 2 BP meds even though his list mentions 3 total.  Patient endorses snoring, daytime somnolence and fatigue.  A sleep study is being planned by pulmonary medicine.  Past Medical History:  Diagnosis Date  . Asthma     Past Surgical History:  Procedure Laterality Date  . HERNIA REPAIR      Current Medications: Current Meds  Medication Sig  . albuterol (VENTOLIN HFA) 108 (90 Base) MCG/ACT inhaler Inhale 2 puffs into the lungs every 6 (six) hours as needed for wheezing or shortness of breath.  Marland Kitchen amLODipine (NORVASC) 10 MG tablet Take 1 tablet (10 mg total) by mouth daily.  . cephALEXin (KEFLEX) 500 MG capsule 2 bid x 7 days  . hydrochlorothiazide (HYDRODIURIL) 12.5 MG tablet Take 1 tablet (12.5 mg total) by mouth daily. To add to his current losartan-HCTZ for edema  . montelukast  (SINGULAIR) 10 MG tablet Take 1 tablet (10 mg total) by mouth at bedtime.  . pantoprazole (PROTONIX) 40 MG tablet Take 1 tablet by mouth daily     Allergies:   Patient has no known allergies.   Social History   Socioeconomic History  . Marital status: Single    Spouse name: Not on file  . Number of children: Not on file  . Years of education: Not on file  . Highest education level: Not on file  Occupational History  . Not on file  Tobacco Use  . Smoking status: Current Every Day Smoker    Packs/day: 1.00    Years: 11.00    Pack years: 11.00    Types: Cigarettes  . Smokeless tobacco: Never Used  Vaping Use  . Vaping Use: Never used  Substance and Sexual Activity  . Alcohol use: Yes    Comment: 8 beers per day and more on the weekends  . Drug use: No  . Sexual activity: Yes  Other Topics Concern  . Not on file  Social History Narrative  . Not on file   Social Determinants of Health   Financial Resource Strain:   . Difficulty of Paying Living Expenses: Not on file  Food Insecurity:   . Worried About Programme researcher, broadcasting/film/video in the Last Year: Not on file  . Ran Out of  Food in the Last Year: Not on file  Transportation Needs:   . Lack of Transportation (Medical): Not on file  . Lack of Transportation (Non-Medical): Not on file  Physical Activity:   . Days of Exercise per Week: Not on file  . Minutes of Exercise per Session: Not on file  Stress:   . Feeling of Stress : Not on file  Social Connections:   . Frequency of Communication with Friends and Family: Not on file  . Frequency of Social Gatherings with Friends and Family: Not on file  . Attends Religious Services: Not on file  . Active Member of Clubs or Organizations: Not on file  . Attends Banker Meetings: Not on file  . Marital Status: Not on file     Family History: The patient's family history includes Hypertension in his mother.  ROS:   Please see the history of present illness.     All  other systems reviewed and are negative.  EKGs/Labs/Other Studies Reviewed:    The following studies were reviewed today:   EKG:  EKG is  ordered today.  The ekg ordered today demonstrates normal sinus rhythm  Recent Labs: 07/01/2020: ALT 43; BUN 18; Creatinine, Ser 0.85; Hemoglobin 15.7; Platelets 202; Potassium 4.3; Sodium 137  Recent Lipid Panel No results found for: CHOL, TRIG, HDL, CHOLHDL, VLDL, LDLCALC, LDLDIRECT  Physical Exam:    VS:  BP (!) 132/98 (BP Location: Right Arm, Patient Position: Sitting, Cuff Size: Large)   Pulse 65   Ht 5\' 4"  (1.626 m)   Wt 249 lb 2 oz (113 kg)   SpO2 98%   BMI 42.76 kg/m     Wt Readings from Last 3 Encounters:  07/18/20 249 lb 2 oz (113 kg)  07/04/20 246 lb 3.2 oz (111.7 kg)  07/01/20 240 lb (108.9 kg)     GEN:  Well nourished, well developed in no acute distress HEENT: Normal NECK: No JVD; No carotid bruits LYMPHATICS: No lymphadenopathy CARDIAC: RRR, no murmurs, rubs, gallops RESPIRATORY: Bilateral inspiratory and expiratory wheezing noted ABDOMEN: Soft, non-tender, non-distended MUSCULOSKELETAL:  No edema; No deformity  SKIN: Warm and dry NEUROLOGIC:  Alert and oriented x 3 PSYCHIATRIC:  Normal affect   ASSESSMENT:    1. Essential hypertension   2. Smoking   3. Morbid obesity (HCC)   4. Snoring   5. Wheezing    PLAN:    In order of problems listed above:  1. Patient with history of hypertension, BP reasonably controlled although still elevated.  Continue current BP meds for now.  Patient will call office with exact medications and dosage.  He was advised on low-salt diet, weight loss and exercise. 2. Patient is a current smoker, smoking cessation advised. 3. Patient is currently morbidly obese, low-calorie diet, exercise, weight loss advised.  This will help with both blood pressure and also possible OSA. 4. Patient currently snores, has daytime fatigue and somnolence.  Being seen by pulmonary medicine for sleep apnea  eval.  Follow-up GI recommendations. 5. Inspiratory and expiratory wheezing noted on exam.  Patient has a history of asthma and follows with pulmonary medicine.  Keep follow-up appointments with pulmonary team.  Follow-up in 3 months for BP eval.  If blood pressure not well controlled, will consider additional medication.   Medication Adjustments/Labs and Tests Ordered: Current medicines are reviewed at length with the patient today.  Concerns regarding medicines are outlined above.  Orders Placed This Encounter  Procedures  . EKG 12-Lead  No orders of the defined types were placed in this encounter.   Patient Instructions  Medication Instructions:   PLEASE CALL our office to give [place] your current BP medications and their dosage: [phone].   *If you need a refill on your cardiac medications before your next appointment, please call your pharmacy*   Lab Work: None Ordered  If you have labs (blood work) drawn today and your tests are completely normal, you will receive your results only by: Marland Kitchen MyChart Message (if you have MyChart) OR . A paper copy in the mail If you have any lab test that is abnormal or we need to change your treatment, we will call you to review the results.   Testing/Procedures: None Ored[place]   Follow-Up: At [facility], you and your health needs are our priority.  As part of our continuing mission to provide you with exceptional heart care, we have created designated Provider Care Teams.  These Care Teams include your primary Cardiologist (physician) and Advanced Practice Providers (APPs -  Physician Assistants and Nurse Practitioners) who all work together to provide you with the care you need, when you need it.  We recommend signing up for the patient portal called "MyChart".  Sign up information is provided on this After Visit Summary.  MyChart is used to connect with patients for Virtual Visits (Telemedicine).  Patients are able to view lab/test  results, encounter notes, upcoming appointments, etc.  Non-urgent messages can be sent to your provider as well.   To learn more about what you can do with MyChart, go to [email or web address].    Your next appointment:   3 month(s)  The format for your next appointment:   In Person  Provider:   Debbe Odea, MD   Other Instructions      Signed, Debbe Odea, MD  07/18/2020 5:26 PM    [facility]

## 2020-07-18 NOTE — Patient Instructions (Addendum)
Medication Instructions:   PLEASE CALL our office to give [place] your current BP medications and their dosage: [phone].   *If you need a refill on your cardiac medications before your next appointment, please call your pharmacy*   Lab Work: None Ordered  If you have labs (blood work) drawn today and your tests are completely normal, you will receive your results only by:  MyChart Message (if you have MyChart) OR  A paper copy in the mail If you have any lab test that is abnormal or we need to change your treatment, we will call you to review the results.   Testing/Procedures: None Oredered   Follow-Up: At [facility], you and your health needs are our priority.  As part of our continuing mission to provide you with exceptional heart care, we have created designated Provider Care Teams.  These Care Teams include your primary Cardiologist (physician) and Advanced Practice Providers (APPs -  Physician Assistants and Nurse Practitioners) who all work together to provide you with the care you need, when you need it.  We recommend signing up for the patient portal called "MyChart".  Sign up information is provided on this After Visit Summary.  MyChart is used to connect with patients for Virtual Visits (Telemedicine).  Patients are able to view lab/test results, encounter notes, upcoming appointments, etc.  Non-urgent messages can be sent to your provider as well.   To learn more about what you can do with MyChart, go to [email or web address].    Your next appointment:   3 month(s)  The format for your next appointment:   In Person  Provider:   Debbe Odea, MD   Other Instructions

## 2020-07-19 ENCOUNTER — Telehealth: Payer: Self-pay | Admitting: Cardiology

## 2020-07-19 NOTE — Telephone Encounter (Signed)
Patient calling to update medication list  Losartan 50-12.5 MG 1 tablet daily Pantoprazole 40 MG 1 tablet daily Montelukast 10 MG 1 tablet daily at bedtime

## 2020-07-20 NOTE — Telephone Encounter (Signed)
I added the Losartan-hydrochlorthiazide 50-12.5 mg to patients med list. It looks as though he also takes a hydrochlorothiazide 12.5 prn in addition to this.   Will route to Dr. Azucena Cecil for review.

## 2020-07-22 NOTE — Progress Notes (Signed)
Agree with the details of the visit as noted by Brian Mack, NP.  C. Laura Jawaun Celmer, MD [facility] 

## 2020-08-22 ENCOUNTER — Telehealth: Payer: Self-pay | Admitting: Pulmonary Disease

## 2020-08-22 NOTE — Telephone Encounter (Signed)
Spoke to patient, who states that Symbicort is not affordable for him.  I have recommended that he contact insurance company and request a list of cheaper alternatives. Patient will call back with update.

## 2020-08-23 ENCOUNTER — Encounter: Payer: Self-pay | Admitting: Pulmonary Disease

## 2020-08-27 ENCOUNTER — Other Ambulatory Visit: Payer: Self-pay | Admitting: Family Medicine

## 2020-08-27 NOTE — Telephone Encounter (Signed)
Requested medication (s) are due for refill today: yes  Requested medication (s) are on the active medication list: historical med  Last refill:  07/04/20  Future visit scheduled: yes  Notes to clinic:  historical provider   Requested Prescriptions  Pending Prescriptions Disp Refills   VENTOLIN HFA 108 (90 Base) MCG/ACT inhaler [Pharmacy Med Name: Ventolin HFA 108 (90 Base) MCG/ACT Inhalation Aerosol Solution] 18 g 0    Sig: INHALE 1 TO 2 PUFFS BY MOUTH EVERY 6 HOURS AS NEEDED FOR WHEEZING OR SHORTNESS OF BREATH      Pulmonology:  Beta Agonists Failed - 08/27/2020  1:28 PM      Failed - One inhaler should last at least one month. If the patient is requesting refills earlier, contact the patient to check for uncontrolled symptoms.      Passed - Valid encounter within last 12 months    Recent Outpatient Visits           2 months ago Hypertension, unspecified type   [facility], Jodelle Gross, FNP   3 months ago Hypertension, unspecified type   [facility], Jodelle Gross, FNP   3 months ago Hypertension, unspecified type   [facility], Jodelle Gross, FNP   5 months ago Hypertension, unspecified type   [facility], Jodelle Gross, FNP   5 months ago Hypertension, unspecified type   [facility], Jodelle Gross, FNP       Future Appointments             In 1 week [place], Jodelle Gross, FNP [facility], [facility]   In 1 month Agbor-Etang, Arlys John, MD [facility]

## 2020-08-30 ENCOUNTER — Other Ambulatory Visit: Payer: Self-pay

## 2020-08-30 MED ORDER — PANTOPRAZOLE SODIUM 40 MG PO TBEC
DELAYED_RELEASE_TABLET | ORAL | 3 refills | Status: DC
Start: 2020-08-30 — End: 2020-12-19

## 2020-08-30 NOTE — Telephone Encounter (Signed)
Rx for Pantoprazole 40mg  has been sent to pharmacy, as requested by pharmacy. Nothing further needed.

## 2020-09-08 ENCOUNTER — Other Ambulatory Visit: Payer: Self-pay

## 2020-09-08 ENCOUNTER — Encounter: Payer: Self-pay | Admitting: Family Medicine

## 2020-09-08 ENCOUNTER — Ambulatory Visit: Payer: BC Managed Care – PPO | Admitting: Family Medicine

## 2020-09-08 VITALS — BP 158/92 | HR 69 | Temp 98.3°F | Resp 17 | Ht 64.0 in | Wt 249.0 lb

## 2020-09-08 DIAGNOSIS — I10 Essential (primary) hypertension: Secondary | ICD-10-CM

## 2020-09-08 DIAGNOSIS — J4551 Severe persistent asthma with (acute) exacerbation: Secondary | ICD-10-CM

## 2020-09-08 MED ORDER — LOSARTAN POTASSIUM-HCTZ 50-12.5 MG PO TABS: 1.0000 | ORAL_TABLET | Freq: Every day | ORAL | 3 refills | Status: DC

## 2020-09-08 MED ORDER — HYDROCHLOROTHIAZIDE 12.5 MG PO TABS: 12.5000 mg | ORAL_TABLET | Freq: Every day | ORAL | 3 refills | Status: DC

## 2020-09-08 MED ORDER — FLUTICASONE-SALMETEROL 100-50 MCG/DOSE IN AEPB: 1.0000 | INHALATION_SPRAY | Freq: Two times a day (BID) | RESPIRATORY_TRACT | 3 refills | Status: DC

## 2020-09-08 NOTE — Assessment & Plan Note (Signed)
Patient has not been taking his symbicort due to cost.  Discussed alternatives such as advair, will send in Rx to pharmacy on file and encouraged to contact our office or pulmonary office if patient is unable to afford.  Encouraged to contact insurance company to find out a copy of their formulary so we can send in an alternative if needed.  Will continue singulair and albuterol rescue inhaler as needed. Schedule F/U with pulmonary.

## 2020-09-08 NOTE — Progress Notes (Signed)
Subjective:    Patient ID: Miguel Rail., male    DOB: 18-Dec-1990, 29 y.o.   MRN: [id number]  Miguel Mogle. is a 29 y.o. male presenting on 09/08/2020 for Hypertension   HPI   [name] presents to clinic for a follow up on his hypertension and asthma.  Reports he has not been taking his symbicort due to cost.  Has continued to use his singulair and albuterol inhaler PRN.  Hypertension - He is not checking BP at home or outside of clinic.    - Current medications: losartan-HCTZ 50-12.5mg  daily, tolerating well without side effects - He is not currently symptomatic. - Pt denies headache, lightheadedness, dizziness, changes in vision, chest tightness/pressure, palpitations, leg swelling, sudden loss of speech or loss of consciousness. - He  reports no regular exercise routine. - His diet is high in salt, high in fat, and high in carbohydrates.   Depression screen [facility] 2/9 05/06/2020 03/03/2020 01/03/2018  Decreased Interest 0 0 0  Down, Depressed, Hopeless 0 0 0  PHQ - 2 Score 0 0 0  Altered sleeping 0 - -  Tired, decreased energy 0 - -  Change in appetite 0 - -  Feeling bad or failure about yourself  0 - -  Trouble concentrating 0 - -  Moving slowly or fidgety/restless 0 - -  Suicidal thoughts 0 - -  PHQ-9 Score 0 - -  Difficult doing work/chores Not difficult at all - -    Social History   Tobacco Use  . Smoking status: Current Every Day Smoker    Packs/day: 1.00    Years: 11.00    Pack years: 11.00    Types: Cigarettes  . Smokeless tobacco: Never Used  Vaping Use  . Vaping Use: Never used  Substance Use Topics  . Alcohol use: Yes    Comment: 8 beers per day and more on the weekends  . Drug use: No    Review of Systems  Constitutional: Negative.   HENT: Negative.   Eyes: Negative.   Respiratory: Negative.   Cardiovascular: Negative.   Gastrointestinal: Negative.   Endocrine: Negative.   Genitourinary: Negative.   Musculoskeletal: Negative.   Skin:  Negative.   Allergic/Immunologic: Negative.   Neurological: Negative.   Hematological: Negative.   Psychiatric/Behavioral: Negative.    Per HPI unless specifically indicated above     Objective:    BP (!) 158/92 (BP Location: Right Arm, Patient Position: Sitting, Cuff Size: Large)   Pulse 69   Temp 98.3 F (36.8 C) (Oral)   Resp 17   Ht 5\' 4"  (1.626 m)   Wt 249 lb (112.9 kg)   SpO2 99%   BMI 42.74 kg/m   Wt Readings from Last 3 Encounters:  09/08/20 249 lb (112.9 kg)  07/18/20 249 lb 2 oz (113 kg)  07/04/20 246 lb 3.2 oz (111.7 kg)    Physical Exam Vitals and nursing note reviewed.  Constitutional:      General: He is not in acute distress.    Appearance: Normal appearance. He is well-developed and well-groomed. He is morbidly obese. He is not ill-appearing or toxic-appearing.  HENT:     Head: Normocephalic and atraumatic.     Nose:     Comments: 07/06/20 is in place, covering mouth and nose. Eyes:     General:        Right eye: No discharge.        Left eye: No discharge.  Extraocular Movements: Extraocular movements intact.     Conjunctiva/sclera: Conjunctivae normal.     Pupils: Pupils are equal, round, and reactive to light.  Cardiovascular:     Rate and Rhythm: Normal rate and regular rhythm.     Pulses: Normal pulses.     Heart sounds: Normal heart sounds. No murmur heard.  No friction rub. No gallop.   Pulmonary:     Effort: Pulmonary effort is normal. No respiratory distress.     Breath sounds: Normal breath sounds. No wheezing.  Musculoskeletal:     Right lower leg: No edema.     Left lower leg: No edema.  Skin:    General: Skin is warm and dry.     Capillary Refill: Capillary refill takes less than 2 seconds.  Neurological:     General: No focal deficit present.     Mental Status: He is alert and oriented to person, place, and time.  Psychiatric:        Attention and Perception: Attention and perception normal.        Mood and Affect: Mood and  affect normal.        Speech: Speech normal.        Behavior: Behavior normal. Behavior is cooperative.        Thought Content: Thought content normal.        Cognition and Memory: Cognition and memory normal.    Results for orders placed or performed during the hospital encounter of 07/04/20  Alpha-1 antitrypsin phenotype  Result Value Ref Range   A-1 Antitrypsin Pheno MM    A-1 Antitrypsin, Ser 97 95 - 164 mg/dL      Assessment & Plan:   Problem List Items Addressed This Visit      Cardiovascular and Mediastinum   Hypertension - Primary    Uncontrolled hypertension.  BP is not at goal < 130/80.  Pt is not working on lifestyle modifications.  Patient reports he did not take his medications this morning.  Upon medication review, he has stopped his amlodipine since last August 2021 due to bilateral lower extremity edema.  Discussed restarting on the HCTZ 12.5 in addition to his losartan-HCTZ 50-12.5mg  and f/u in 2 weeks to recheck BP Complications:  Morbid obesity, poorly controlled asthma  Plan: 1. Continue taking losartan-HCTZ 50-12.5mg  and BEGIN additional HCTZ 12.5mg  daily each morning 2. Obtain labs at next visit  3. Encouraged heart healthy diet and increasing exercise to 30 minutes most days of the week, going no more than 2 days in a row without exercise. 4. Check BP 1-2 x per week at home, keep log, and bring to clinic at next appointment. 5. Follow up 2 weeks.       Relevant Medications   losartan-hydrochlorothiazide (HYZAAR) 50-12.5 MG tablet   hydrochlorothiazide (HYDRODIURIL) 12.5 MG tablet     Respiratory   Severe persistent asthma    Patient has not been taking his symbicort due to cost.  Discussed alternatives such as advair, will send in Rx to pharmacy on file and encouraged to contact our office or pulmonary office if patient is unable to afford.  Encouraged to contact insurance company to find out a copy of their formulary so we can send in an alternative if  needed.  Will continue singulair and albuterol rescue inhaler as needed. Schedule F/U with pulmonary.      Relevant Medications   Fluticasone-Salmeterol (ADVAIR DISKUS) 100-50 MCG/DOSE AEPB      Meds ordered this encounter  Medications  .  losartan-hydrochlorothiazide (HYZAAR) 50-12.5 MG tablet    Sig: Take 1 tablet by mouth daily.    Dispense:  90 tablet    Refill:  3  . hydrochlorothiazide (HYDRODIURIL) 12.5 MG tablet    Sig: Take 1 tablet (12.5 mg total) by mouth daily. To add to his current losartan-HCTZ for edema    Dispense:  90 tablet    Refill:  3  . Fluticasone-Salmeterol (ADVAIR DISKUS) 100-50 MCG/DOSE AEPB    Sig: Inhale 1 puff into the lungs in the morning and at bedtime.    Dispense:  60 each    Refill:  3   Follow up plan: Return in about 2 weeks (around 09/22/2020) for HTN F/U.   Charlaine Dalton, FNP Family Nurse Practitioner [facility] 09/08/2020, 4:23 PM

## 2020-09-08 NOTE — Assessment & Plan Note (Signed)
Uncontrolled hypertension.  BP is not at goal < 130/80.  Pt is not working on lifestyle modifications.  Patient reports he did not take his medications this morning.  Upon medication review, he has stopped his amlodipine since last August 2021 due to bilateral lower extremity edema.  Discussed restarting on the HCTZ 12.5 in addition to his losartan-HCTZ 50-12.5mg  and f/u in 2 weeks to recheck BP Complications:  Morbid obesity, poorly controlled asthma  Plan: 1. Continue taking losartan-HCTZ 50-12.5mg  and BEGIN additional HCTZ 12.5mg  daily each morning 2. Obtain labs at next visit  3. Encouraged heart healthy diet and increasing exercise to 30 minutes most days of the week, going no more than 2 days in a row without exercise. 4. Check BP 1-2 x per week at home, keep log, and bring to clinic at next appointment. 5. Follow up 2 weeks.

## 2020-09-08 NOTE — Patient Instructions (Signed)
I have noted the discontinuation of amlodipine in your chart.    Restart on your losartan-HCTZ 50-12.5mg  daily.  I have sent in a prescription for hydrochlorothiazide 12.5mg  to take 1 tablet daily when you take your losartan-HCTZ tablet in the morning each day.  I have switched your symbicort to advair 1 puff 2x daily due to cost.  If this medication is expensive, please contact your insurance company for a copy of their formulary so we can find an alternative.  Try to get exercise a minimum of 30 minutes per day at least 5 days per week as well as  adequate water intake all while measuring blood pressure a few times per week.  Keep a blood pressure log and bring back to clinic at your next visit.  If your readings are consistently over 130/80 to contact our office/send me a MyChart message and we will see you sooner.  Can try DASH and Mediterranean diet options, avoiding processed foods, lowering sodium intake, avoiding pork products, and eating a plant based diet for optimal health.  We will plan to see you back in 2 weeks for hypertension follow up visit  You will receive a survey after today's visit either digitally by e-mail or paper by USPS mail. Your experiences and feedback matter to [place].  Please respond so we know how we are doing as we provide care for you.  Call us with any questions/concerns/needs.  It is my goal to be available to you for your health concerns.  Thanks for choosing me to be a partner in your healthcare needs!  Charlaine Dalton, FNP-C Family Nurse Practitioner [facility] Phone: [phone]

## 2020-09-20 ENCOUNTER — Ambulatory Visit: Payer: BC Managed Care – PPO | Admitting: Family Medicine

## 2020-09-20 ENCOUNTER — Ambulatory Visit: Payer: BC Managed Care – PPO | Admitting: Pulmonary Disease

## 2020-09-21 ENCOUNTER — Encounter: Payer: Self-pay | Admitting: Family Medicine

## 2020-09-21 ENCOUNTER — Other Ambulatory Visit: Payer: Self-pay

## 2020-09-21 ENCOUNTER — Ambulatory Visit: Payer: BC Managed Care – PPO | Admitting: Family Medicine

## 2020-09-21 VITALS — BP 137/81 | HR 67 | Temp 98.4°F | Resp 20 | Ht 64.0 in | Wt 244.6 lb

## 2020-09-21 DIAGNOSIS — I10 Essential (primary) hypertension: Secondary | ICD-10-CM

## 2020-09-21 DIAGNOSIS — J4551 Severe persistent asthma with (acute) exacerbation: Secondary | ICD-10-CM

## 2020-09-21 MED ORDER — ALBUTEROL SULFATE HFA 108 (90 BASE) MCG/ACT IN AERS: INHALATION_SPRAY | RESPIRATORY_TRACT | 0 refills | Status: DC

## 2020-09-21 NOTE — Assessment & Plan Note (Signed)
Reports stable and well controlled with advair usage twice daily.  Reports using albuterol inhaler 1-2x per week.  Feels that his control has improved significantly since starting this medication.  Will renew advair and send in refill on albuterol inhaler to have on hand should he need.  RTC in 3 months.

## 2020-09-21 NOTE — Progress Notes (Signed)
Subjective:    Patient ID: Miguel Perez., male    DOB: 05-12-1991, 29 y.o.   MRN: [id number]  Miguel Perez. is a 29 y.o. male presenting on 09/21/2020 for Hypertension   HPI   Mr. Buttram presents to clinic for a follow up on his hypertension and a refill on his albuterol inhaler.  Denies any acute concerns.  Hypertension - He is not checking BP at home or outside of clinic.    - Current medications: losartan-HCTZ 50-12.5mg  and HCTZ 12.5mg  daily, tolerating well without side effects - He is not currently symptomatic. - Pt denies headache, lightheadedness, dizziness, changes in vision, chest tightness/pressure, palpitations, leg swelling, sudden loss of speech or loss of consciousness. - He  reports no regular exercise routine. - His diet is high in salt, high in fat, and high in carbohydrates.  Depression screen [facility] 2/9 05/06/2020 03/03/2020 01/03/2018  Decreased Interest 0 0 0  Down, Depressed, Hopeless 0 0 0  PHQ - 2 Score 0 0 0  Altered sleeping 0 - -  Tired, decreased energy 0 - -  Change in appetite 0 - -  Feeling bad or failure about yourself  0 - -  Trouble concentrating 0 - -  Moving slowly or fidgety/restless 0 - -  Suicidal thoughts 0 - -  PHQ-9 Score 0 - -  Difficult doing work/chores Not difficult at all - -    Social History   Tobacco Use  . Smoking status: Current Every Day Smoker    Packs/day: 1.00    Years: 11.00    Pack years: 11.00    Types: Cigarettes  . Smokeless tobacco: Never Used  Vaping Use  . Vaping Use: Never used  Substance Use Topics  . Alcohol use: Yes    Alcohol/week: 8.0 standard drinks    Types: 8 Standard drinks or equivalent per week    Comment: 8 standard on a weekly basis   . Drug use: No    Review of Systems  Constitutional: Negative.   HENT: Negative.   Eyes: Negative.   Respiratory: Negative.   Cardiovascular: Negative.   Gastrointestinal: Negative.   Endocrine: Negative.   Genitourinary: Negative.     Musculoskeletal: Negative.   Skin: Negative.   Allergic/Immunologic: Negative.   Neurological: Negative.   Hematological: Negative.   Psychiatric/Behavioral: Negative.    Per HPI unless specifically indicated above     Objective:    BP 137/81 (BP Location: Left Arm, Patient Position: Sitting, Cuff Size: Large)   Pulse 67   Temp 98.4 F (36.9 C) (Oral)   Resp 20   Ht 5\' 4"  (1.626 m)   Wt 244 lb 9.6 oz (110.9 kg)   SpO2 97%   BMI 41.99 kg/m   Wt Readings from Last 3 Encounters:  09/21/20 244 lb 9.6 oz (110.9 kg)  09/08/20 249 lb (112.9 kg)  07/18/20 249 lb 2 oz (113 kg)    Physical Exam Vitals and nursing note reviewed.  Constitutional:      General: He is not in acute distress.    Appearance: Normal appearance. He is well-developed and well-groomed. He is morbidly obese. He is not ill-appearing or toxic-appearing.  HENT:     Head: Normocephalic and atraumatic.     Nose:     Comments: 07/20/20 is in place, covering mouth and nose. Eyes:     General:        Right eye: No discharge.  Left eye: No discharge.     Extraocular Movements: Extraocular movements intact.     Conjunctiva/sclera: Conjunctivae normal.     Pupils: Pupils are equal, round, and reactive to light.  Cardiovascular:     Rate and Rhythm: Normal rate and regular rhythm.     Pulses: Normal pulses.     Heart sounds: Normal heart sounds. No murmur heard.  No friction rub. No gallop.   Pulmonary:     Effort: Pulmonary effort is normal. No prolonged expiration or respiratory distress.     Breath sounds: Normal breath sounds.  Musculoskeletal:     Right lower leg: No edema.     Left lower leg: No edema.  Skin:    General: Skin is warm and dry.     Capillary Refill: Capillary refill takes less than 2 seconds.  Neurological:     General: No focal deficit present.     Mental Status: He is alert and oriented to person, place, and time.  Psychiatric:        Attention and Perception: Attention and  perception normal.        Mood and Affect: Mood and affect normal.        Speech: Speech normal.        Behavior: Behavior normal. Behavior is cooperative.        Thought Content: Thought content normal.        Cognition and Memory: Cognition and memory normal.    Results for orders placed or performed during the hospital encounter of 07/04/20  Alpha-1 antitrypsin phenotype  Result Value Ref Range   A-1 Antitrypsin Pheno MM    A-1 Antitrypsin, Ser 97 95 - 164 mg/dL      Assessment & Plan:   Problem List Items Addressed This Visit      Cardiovascular and Mediastinum   Hypertension - Primary    Controlled hypertension.  BP is just above goal < 130/80.  Pt is not working on lifestyle modifications.  Taking medications tolerating well without side effects.  Complications:  Morbid obesity, Asthma  Plan: 1. Continue taking losartan-HCTZ 50-12.5mg  AND HCTZ 12.5mg  (total 25mg  HCTZ daily) 2. Obtain labs next visit  3. Encouraged heart healthy diet and increasing exercise to 30 minutes most days of the week, going no more than 2 days in a row without exercise. 4. Check BP 1-2 x per week at home, keep log, and bring to clinic at next appointment. 5. Follow up 3 months.         Respiratory   Severe persistent asthma    Reports stable and well controlled with advair usage twice daily.  Reports using albuterol inhaler 1-2x per week.  Feels that his control has improved significantly since starting this medication.  Will renew advair and send in refill on albuterol inhaler to have on hand should he need.  RTC in 3 months.      Relevant Medications   albuterol (VENTOLIN HFA) 108 (90 Base) MCG/ACT inhaler      Meds ordered this encounter  Medications  . albuterol (VENTOLIN HFA) 108 (90 Base) MCG/ACT inhaler    Sig: INHALE 1 TO 2 PUFFS BY MOUTH EVERY 6 HOURS AS NEEDED FOR WHEEZING OR SHORTNESS OF BREATH    Dispense:  18 g    Refill:  0    Follow up plan: Return in about 3 months  (around 12/22/2020) for HTN F/U.   12/24/2020, FNP Family Nurse Practitioner [facility] 09/21/2020, 3:29 PM

## 2020-09-21 NOTE — Patient Instructions (Signed)
Continue medications as prescribed.  Try to get exercise a minimum of 30 minutes per day at least 5 days per week as well as  adequate water intake all while measuring blood pressure a few times per week.  Keep a blood pressure log and bring back to clinic at your next visit.  If your readings are consistently over 130/80 to contact our office/send me a MyChart message and we will see you sooner.  Can try DASH and Mediterranean diet options, avoiding processed foods, lowering sodium intake, avoiding pork products, and eating a plant based diet for optimal health.  We will plan to see you back in 3 months for hypertension follow up visit  You will receive a survey after today's visit either digitally by e-mail or paper by USPS mail. Your experiences and feedback matter to [place].  Please respond so we know how we are doing as we provide care for you.  Call us with any questions/concerns/needs.  It is my goal to be available to you for your health concerns.  Thanks for choosing me to be a partner in your healthcare needs!  Charlaine Dalton, FNP-C Family Nurse Practitioner [facility] Phone: [phone]

## 2020-09-21 NOTE — Assessment & Plan Note (Signed)
Controlled hypertension.  BP is just above goal < 130/80.  Pt is not working on lifestyle modifications.  Taking medications tolerating well without side effects.  Complications:  Morbid obesity, Asthma  Plan: 1. Continue taking losartan-HCTZ 50-12.5mg  AND HCTZ 12.5mg  (total 25mg  HCTZ daily) 2. Obtain labs next visit  3. Encouraged heart healthy diet and increasing exercise to 30 minutes most days of the week, going no more than 2 days in a row without exercise. 4. Check BP 1-2 x per week at home, keep log, and bring to clinic at next appointment. 5. Follow up 3 months.

## 2020-10-17 ENCOUNTER — Ambulatory Visit: Payer: BC Managed Care – PPO | Admitting: Cardiology

## 2020-11-28 ENCOUNTER — Other Ambulatory Visit: Payer: Self-pay | Admitting: Family Medicine

## 2020-11-28 DIAGNOSIS — J4551 Severe persistent asthma with (acute) exacerbation: Secondary | ICD-10-CM

## 2020-12-19 ENCOUNTER — Other Ambulatory Visit: Payer: Self-pay | Admitting: Pulmonary Disease

## 2020-12-22 ENCOUNTER — Other Ambulatory Visit: Payer: Self-pay

## 2020-12-22 DIAGNOSIS — J4541 Moderate persistent asthma with (acute) exacerbation: Secondary | ICD-10-CM

## 2020-12-22 MED ORDER — MONTELUKAST SODIUM 10 MG PO TABS: 10.0000 mg | ORAL_TABLET | Freq: Every day | ORAL | 3 refills | Status: DC

## 2020-12-23 ENCOUNTER — Other Ambulatory Visit: Payer: Self-pay

## 2020-12-26 ENCOUNTER — Other Ambulatory Visit: Payer: Self-pay

## 2021-01-19 ENCOUNTER — Ambulatory Visit: Payer: BC Managed Care – PPO | Admitting: Family Medicine

## 2021-01-24 ENCOUNTER — Encounter: Payer: Self-pay | Admitting: Unknown Physician Specialty

## 2021-01-24 ENCOUNTER — Other Ambulatory Visit: Payer: Self-pay

## 2021-01-24 ENCOUNTER — Ambulatory Visit: Payer: BC Managed Care – PPO | Admitting: Unknown Physician Specialty

## 2021-01-24 DIAGNOSIS — I10 Essential (primary) hypertension: Secondary | ICD-10-CM

## 2021-01-24 LAB — POCT GLYCOSYLATED HEMOGLOBIN (HGB A1C): Hemoglobin A1C: 5.8 % — AB (ref 4.0–5.6)

## 2021-01-24 MED ORDER — VALSARTAN 160 MG PO TABS: 160.0000 mg | ORAL_TABLET | Freq: Every day | ORAL | 1 refills | Status: DC

## 2021-01-24 NOTE — Assessment & Plan Note (Addendum)
Patient having polyuria. Checked Hgb A1c today which is 5.8%. Discussed increasing activity and healthy lifestyle.

## 2021-01-24 NOTE — Progress Notes (Signed)
BP 132/74 (BP Location: Left Arm, Patient Position: Sitting, Cuff Size: Large)   Pulse 66   Temp 97.7 F (36.5 C) (Temporal)   Ht 5\' 4"  (1.626 m)   Wt 240 lb 6.4 oz (109 kg)   SpO2 98%   BMI 41.26 kg/m    Subjective:    Patient ID: ., male    DOB: [birth year], 30 y.o.   MRN: [id number]  HPI: Miguel Perez is a 30 y.o. male  Chief Complaint  Patient presents with  . Hypertension    Follow up- He stated he tries to be on track with his medication.   Hypertension Patient has not checked blood pressure recently but the last time he did it was 140s/90s. Denies headache and chest pain. Patient has cut out fried food and is watching his salt intake. Patient does not have a regular exercise routine but is active at work and constantly moving. Patient has concerns with increased urinary frequency from his blood pressure medication. Patient also states that swelling has resolved.   Relevant past medical, surgical, family and social history reviewed and updated as indicated. Interim medical history since our last visit reviewed. Allergies and medications reviewed and updated.  Review of Systems  Constitutional: Positive for fatigue. Negative for fever.  Eyes: Negative.   Respiratory: Negative for shortness of breath.   Cardiovascular: Negative for chest pain and palpitations.  Gastrointestinal: Negative for constipation, nausea and vomiting.  Endocrine: Positive for polyuria.  Genitourinary: Positive for frequency.  Musculoskeletal: Negative.   Skin: Negative.   Allergic/Immunologic: Positive for environmental allergies.  Neurological: Negative for dizziness and headaches.    Per HPI unless specifically indicated above     Objective:    BP 132/74 (BP Location: Left Arm, Patient Position: Sitting, Cuff Size: Large)   Pulse 66   Temp 97.7 F (36.5 C) (Temporal)   Ht 5\' 4"  (1.626 m)   Wt 240 lb 6.4 oz (109 kg)   SpO2 98%   BMI 41.26 kg/m   Wt  Readings from Last 3 Encounters:  01/24/21 240 lb 6.4 oz (109 kg)  09/21/20 244 lb 9.6 oz (110.9 kg)  09/08/20 249 lb (112.9 kg)    Physical Exam HENT:     Head: Normocephalic.  Cardiovascular:     Rate and Rhythm: Normal rate and regular rhythm.     Pulses: Normal pulses.     Heart sounds: Normal heart sounds.  Pulmonary:     Effort: Pulmonary effort is normal.     Breath sounds: Normal breath sounds.  Abdominal:     Palpations: Abdomen is soft.  Musculoskeletal:     Right lower leg: No edema.     Left lower leg: No edema.  Skin:    General: Skin is warm and dry.     Capillary Refill: Capillary refill takes less than 2 seconds.  Neurological:     Mental Status: He is alert and oriented to person, place, and time.  Psychiatric:        Mood and Affect: Mood normal.        Behavior: Behavior normal.     Results for orders placed or performed during the hospital encounter of 07/04/20  Alpha-1 antitrypsin phenotype  Result Value Ref Range   A-1 Antitrypsin Pheno MM    A-1 Antitrypsin, Ser 97 95 - 164 mg/dL      Assessment & Plan:   Problem List Items Addressed This Visit  Cardiovascular and Mediastinum   Hypertension    Blood pressure 132/74 today. Due to patient's concerns for urinary frequency, will discontinue Losartan-HCTZ. Will start Valsartan 100 mg. Discussed increasing activity and healthy lifestyle. Patient not ready for smoking cessation. Follow up in 4 weeks.       Relevant Medications   valsartan (DIOVAN) 160 MG tablet     Other   Morbid obesity (HCC) - Primary    Patient having polyuria. Checked Hgb A1c today which is 5.8%. Discussed increasing activity and healthy lifestyle.       Relevant Orders   POCT HgB A1C       Follow up plan: Return in about 4 weeks (around 02/21/2021).

## 2021-01-24 NOTE — Assessment & Plan Note (Addendum)
Blood pressure 132/74 today. Due to patient's concerns for urinary frequency, will discontinue Losartan-HCTZ. Will start Valsartan 100 mg. Discussed increasing activity and healthy lifestyle. Patient not ready for smoking cessation. Follow up in 4 weeks.

## 2021-02-15 ENCOUNTER — Other Ambulatory Visit: Payer: Self-pay

## 2021-02-15 DIAGNOSIS — J4551 Severe persistent asthma with (acute) exacerbation: Secondary | ICD-10-CM

## 2021-02-15 MED ORDER — FLUTICASONE-SALMETEROL 100-50 MCG/DOSE IN AEPB: 1.0000 | INHALATION_SPRAY | Freq: Two times a day (BID) | RESPIRATORY_TRACT | 3 refills | Status: DC

## 2021-02-21 ENCOUNTER — Encounter: Payer: Self-pay | Admitting: Unknown Physician Specialty

## 2021-02-21 ENCOUNTER — Ambulatory Visit: Payer: BC Managed Care – PPO | Admitting: Unknown Physician Specialty

## 2021-02-21 ENCOUNTER — Other Ambulatory Visit: Payer: Self-pay

## 2021-02-21 DIAGNOSIS — I10 Essential (primary) hypertension: Secondary | ICD-10-CM

## 2021-02-21 DIAGNOSIS — R748 Abnormal levels of other serum enzymes: Secondary | ICD-10-CM | POA: Diagnosis not present

## 2021-02-21 DIAGNOSIS — F17200 Nicotine dependence, unspecified, uncomplicated: Secondary | ICD-10-CM

## 2021-02-21 MED ORDER — AMLODIPINE BESYLATE 5 MG PO TABS: 5.0000 mg | ORAL_TABLET | Freq: Every day | ORAL | 3 refills | Status: DC

## 2021-02-21 NOTE — Assessment & Plan Note (Signed)
Cut ut salt and working on cutting down on portions and fried foods

## 2021-02-21 NOTE — Progress Notes (Signed)
BP (!) 144/74 (BP Location: Left Arm, Patient Position: Sitting, Cuff Size: Normal)   Pulse 62   Temp (!) 97.5 F (36.4 C) (Temporal)   Ht 5\' 4"  (1.626 m)   Wt 243 lb 6.4 oz (110.4 kg)   SpO2 98%   BMI 41.78 kg/m    Subjective:    Patient ID: ., male    DOB: 02-26-1991, 30 y.o.   MRN: [id number]  HPI: Miguel Perez is a 30 y.o. male  Chief Complaint  Patient presents with  . Hypertension    Pt states Valsartan has helped the decrease on urinary frequency.   Stopped HCTZ for urinary frequency.  This has improved.  However his BP is higher and also high with home readings-BP of 150's/90's last night.  No chest pain or SOB.  No swelling or edema.    Review of labs show that last labs done 8 months ago.  Elevated GGT in chart lost to f/u from an urgent care visit.   Admits to 2-3 beers every other day.   Used to be a heavier drinker but weaned himself off.    Relevant past medical, surgical, family and social history reviewed and updated as indicated. Interim medical history since our last visit reviewed. Allergies and medications reviewed and updated.  Review of Systems  Per HPI unless specifically indicated above     Objective:    BP (!) 144/74 (BP Location: Left Arm, Patient Position: Sitting, Cuff Size: Normal)   Pulse 62   Temp (!) 97.5 F (36.4 C) (Temporal)   Ht 5\' 4"  (1.626 m)   Wt 243 lb 6.4 oz (110.4 kg)   SpO2 98%   BMI 41.78 kg/m   Wt Readings from Last 3 Encounters:  02/21/21 243 lb 6.4 oz (110.4 kg)  01/24/21 240 lb 6.4 oz (109 kg)  09/21/20 244 lb 9.6 oz (110.9 kg)    Physical Exam Constitutional:      General: He is not in acute distress.    Appearance: Normal appearance. He is well-developed.  HENT:     Head: Normocephalic and atraumatic.  Eyes:     General: Lids are normal. No scleral icterus.       Right eye: No discharge.        Left eye: No discharge.     Conjunctiva/sclera: Conjunctivae normal.  Neck:      Vascular: No carotid bruit or JVD.  Cardiovascular:     Rate and Rhythm: Normal rate and regular rhythm.     Heart sounds: Normal heart sounds.  Pulmonary:     Effort: Pulmonary effort is normal. No respiratory distress.     Breath sounds: Normal breath sounds.  Abdominal:     Palpations: There is no hepatomegaly or splenomegaly.  Musculoskeletal:        General: Normal range of motion.     Cervical back: Normal range of motion and neck supple.  Skin:    General: Skin is warm and dry.     Coloration: Skin is not pale.     Findings: No rash.  Neurological:     Mental Status: He is alert and oriented to person, place, and time.  Psychiatric:        Behavior: Behavior normal.        Thought Content: Thought content normal.        Judgment: Judgment normal.     Results for orders placed or performed in visit on 01/24/21  POCT HgB A1C  Result Value Ref Range   Hemoglobin A1C 5.8 (A) 4.0 - 5.6 %   HbA1c POC (<> result, manual entry)     HbA1c, POC (prediabetic range)     HbA1c, POC (controlled diabetic range)        Assessment & Plan:   Problem List Items Addressed This Visit      Unprioritized   Elevated liver enzymes    Elevated GGT lost to f/u      Relevant Orders   Comprehensive metabolic panel   Gamma GT   Hypertension    Not to goal on just Losartan.  Unwilling to restart HCTZ.  Will start Amlodipine 5 mg and continue Losartan.   Recheck in 1 month      Relevant Medications   amLODipine (NORVASC) 5 MG tablet   Other Relevant Orders   Comprehensive metabolic panel   Lipid Profile   TSH   Morbid obesity (HCC)    Cut ut salt and working on cutting down on portions and fried foods      Smoker    States not ready to quit at this time         Will go to [facility] for labs.    Follow up plan: Return in about 4 weeks (around 03/21/2021).

## 2021-02-21 NOTE — Assessment & Plan Note (Signed)
Elevated GGT lost to f/u

## 2021-02-21 NOTE — Assessment & Plan Note (Signed)
States not ready to quit at this time

## 2021-02-21 NOTE — Assessment & Plan Note (Addendum)
Not to goal on just Losartan.  Unwilling to restart HCTZ.  Will start Amlodipine 5 mg and continue Losartan.   Recheck in 1 month

## 2021-03-21 ENCOUNTER — Ambulatory Visit: Payer: BC Managed Care – PPO | Admitting: Internal Medicine

## 2021-03-21 ENCOUNTER — Other Ambulatory Visit: Payer: Self-pay

## 2021-03-21 ENCOUNTER — Encounter: Payer: Self-pay | Admitting: Internal Medicine

## 2021-03-21 VITALS — BP 138/80 | HR 71 | Temp 97.1°F | Resp 18 | Ht 64.0 in | Wt 242.4 lb

## 2021-03-21 DIAGNOSIS — K219 Gastro-esophageal reflux disease without esophagitis: Secondary | ICD-10-CM

## 2021-03-21 DIAGNOSIS — I10 Essential (primary) hypertension: Secondary | ICD-10-CM

## 2021-03-21 DIAGNOSIS — R7303 Prediabetes: Secondary | ICD-10-CM

## 2021-03-21 DIAGNOSIS — J4551 Severe persistent asthma with (acute) exacerbation: Secondary | ICD-10-CM | POA: Diagnosis not present

## 2021-03-21 DIAGNOSIS — M549 Dorsalgia, unspecified: Secondary | ICD-10-CM

## 2021-03-21 MED ORDER — METHOCARBAMOL 500 MG PO TABS: 500.0000 mg | ORAL_TABLET | Freq: Every evening | ORAL | 0 refills | Status: DC | PRN

## 2021-03-21 NOTE — Progress Notes (Signed)
Subjective:    Patient ID: Miguel Perez., male    DOB: 10/06/1991, 30 y.o.   MRN: [id number]  HPI  Pt presents to the clinic today for follow up of chronic conditions. He is establishing care with me today, transferring care from [facility], NP.  HTN: His BP today is 138/80. He is taking Amlodipine, Valsartan and HCTZ as prescribed. ECG from 07/2020 reviewed.  Allergy Induced  Asthma: Moderate, persistent.  He does report some worsening shortness of breath with laying down.  Managed on Advair, Albuterol and Montelukast. There are no PFT's on file.  GERD: Triggered by tomato based foods. He reports some breakthrough on Pantoprazole. He take Tums as needed. There is no upper GI on file.  Prediabetes: His last A1C was 5.8%, 01/2021. He is not taking any oral diabetic medication at this time. He does not check his sugars.  He also c/o right upper back pain. This started 1 month ago. He describes the pain as sharp and stabbing.  The pain is worse with bending down or lifting something heavy.  He denies numbness, tingling or weakness. He takes Ibuprofen or Aleve as needed with some relief.  He denies any injury to the area  Review of Systems      Past Medical History:  Diagnosis Date  . Asthma     Current Outpatient Medications  Medication Sig Dispense Refill  . albuterol (VENTOLIN HFA) 108 (90 Base) MCG/ACT inhaler INHALE 1 TO 2 PUFFS BY MOUTH EVERY 6 HOURS AS NEEDED FOR WHEEZING FOR SHORTNESS OF BREATH 18 g 0  . amLODipine (NORVASC) 5 MG tablet Take 1 tablet (5 mg total) by mouth daily. 90 tablet 3  . Fluticasone-Salmeterol (ADVAIR DISKUS) 100-50 MCG/DOSE AEPB Inhale 1 puff into the lungs in the morning and at bedtime. 60 each 3  . hydrochlorothiazide (HYDRODIURIL) 12.5 MG tablet Take 1 tablet (12.5 mg total) by mouth daily. To add to his current losartan-HCTZ for edema 90 tablet 3  . montelukast (SINGULAIR) 10 MG tablet Take 1 tablet (10 mg total) by mouth at bedtime. 30 tablet  3  . pantoprazole (PROTONIX) 40 MG tablet Take 1 tablet by mouth once daily 90 tablet 0  . valsartan (DIOVAN) 160 MG tablet Take 1 tablet (160 mg total) by mouth daily. 90 tablet 1   No current facility-administered medications for this visit.    No Known Allergies  Family History  Problem Relation Age of Onset  . Hypertension Mother     Social History   Socioeconomic History  . Marital status: Single    Spouse name: Not on file  . Number of children: Not on file  . Years of education: Not on file  . Highest education level: Not on file  Occupational History  . Not on file  Tobacco Use  . Smoking status: Current Every Day Smoker    Packs/day: 1.00    Years: 11.00    Pack years: 11.00    Types: Cigarettes  . Smokeless tobacco: Never Used  Vaping Use  . Vaping Use: Never used  Substance and Sexual Activity  . Alcohol use: Yes    Alcohol/week: 8.0 standard drinks    Types: 8 Standard drinks or equivalent per week    Comment: 8 standard on a weekly basis   . Drug use: No  . Sexual activity: Yes  Other Topics Concern  . Not on file  Social History Narrative  . Not on file   Social Determinants of  Health   Financial Resource Strain: Not on file  Food Insecurity: Not on file  Transportation Needs: Not on file  Physical Activity: Not on file  Stress: Not on file  Social Connections: Not on file  Intimate Partner Violence: Not on file     Constitutional: Denies fever, malaise, fatigue, headache or abrupt weight changes.  HEENT: Denies eye pain, eye redness, ear pain, ringing in the ears, wax buildup, runny nose, nasal congestion, bloody nose, or sore throat. Respiratory: Patient reports shortness of breath with laying down.  Denies difficulty breathing,  cough or sputum production.   Cardiovascular: Denies chest pain, chest tightness, palpitations or swelling in the hands or feet.  Gastrointestinal: Patient reports intermittent reflux.  Denies abdominal pain,  bloating, constipation, diarrhea or blood in the stool.  GU: Denies urgency, frequency, pain with urination, burning sensation, blood in urine, odor or discharge. Musculoskeletal: Patient reports right upper back pain.  Denies decrease in range of motion, difficulty with gait, or joint pain and swelling.  Skin: Denies redness, rashes, lesions or ulcercations.  Neurological: Denies dizziness, difficulty with memory, difficulty with speech or problems with balance and coordination.  Psych: Denies anxiety, depression, SI/HI.  No other specific complaints in a complete review of systems (except as listed in HPI above).  Objective:   Physical Exam  BP 138/80 (BP Location: Right Arm, Patient Position: Sitting, Cuff Size: Large)   Pulse 71   Temp (!) 97.1 F (36.2 C) (Temporal)   Resp 18   Ht 5\' 4"  (1.626 m)   Wt 242 lb 6.4 oz (110 kg)   SpO2 98%   BMI 41.61 kg/m   Wt Readings from Last 3 Encounters:  02/21/21 243 lb 6.4 oz (110.4 kg)  01/24/21 240 lb 6.4 oz (109 kg)  09/21/20 244 lb 9.6 oz (110.9 kg)    General: Appears his stated age, obese, in NAD. Skin: Warm, dry and intact. No ulcerations noted. HEENT: Head: normal shape and size; Eyes: sclera white and EOMs intact;  Cardiovascular: Normal rate and rhythm. S1,S2 noted.  No murmur, rubs or gallops noted. No JVD or BLE edema.  Pulmonary/Chest: Normal effort and positive vesicular breath sounds. No respiratory distress. No wheezes, rales or ronchi noted.  Abdomen: Soft and nontender. Normal bowel sounds. No distention or masses noted. Musculoskeletal: Pain with palpation of the right subscapular area.  No difficulty with gait.  Neurological: Alert and oriented.  Psychiatric: Mood and affect normal. Behavior is normal. Judgment and thought content normal.     BMET    Component Value Date/Time   NA 137 07/01/2020 1104   K 4.3 07/01/2020 1104   CL 101 07/01/2020 1104   CO2 25 07/01/2020 1104   GLUCOSE 107 (H) 07/01/2020 1104    BUN 18 07/01/2020 1104   CREATININE 0.85 07/01/2020 1104   CREATININE 1.02 05/23/2020 1533   CALCIUM 8.7 (L) 07/01/2020 1104   GFRNONAA >60 07/01/2020 1104   GFRNONAA 100 05/23/2020 1533   GFRAA >60 07/01/2020 1104   GFRAA 115 05/23/2020 1533    Lipid Panel  No results found for: CHOL, TRIG, HDL, CHOLHDL, VLDL, LDLCALC  CBC    Component Value Date/Time   WBC 5.9 07/01/2020 1104   RBC 5.39 07/01/2020 1104   HGB 15.7 07/01/2020 1104   HCT 46.3 07/01/2020 1104   PLT 202 07/01/2020 1104   MCV 85.9 07/01/2020 1104   MCH 29.1 07/01/2020 1104   MCHC 33.9 07/01/2020 1104   RDW 13.2 07/01/2020 1104  LYMPHSABS 1.5 07/01/2020 1104   MONOABS 0.5 07/01/2020 1104   EOSABS 0.2 07/01/2020 1104   BASOSABS 0.0 07/01/2020 1104    Hgb A1C Lab Results  Component Value Date   HGBA1C 5.8 (A) 01/24/2021            Assessment & Plan:   Right Upper Back Pain:  Likely muscular in origin Encourage stretching Encouraged ibuprofen 400 mg every 8 hours as needed with food Rx for Methocarbamol 500 mg nightly as needed-sedation caution given  RTC in 6 months for annual exam Nicki Reaper, NP This visit occurred during the SARS-CoV-2 public health emergency.  Safety protocols were in place, including screening questions prior to the visit, additional usage of staff PPE, and extensive cleaning of exam room while observing appropriate contact time as indicated for disinfecting solutions.

## 2021-03-22 DIAGNOSIS — R7303 Prediabetes: Secondary | ICD-10-CM | POA: Insufficient documentation

## 2021-03-22 DIAGNOSIS — E119 Type 2 diabetes mellitus without complications: Secondary | ICD-10-CM | POA: Insufficient documentation

## 2021-03-22 NOTE — Patient Instructions (Signed)

## 2021-03-22 NOTE — Assessment & Plan Note (Signed)
Controlled on Amlodipine, Valsartan and HCTZ Reinforced DASH diet and exercise for weight loss Will check CMET with annual exam

## 2021-03-22 NOTE — Assessment & Plan Note (Signed)
Continue Advair, Albuterol, Montelukast Add Zyrtec in am

## 2021-03-22 NOTE — Assessment & Plan Note (Signed)
A1C reviewed Encouraged him to consume a low carb diet and exercise for weight loss

## 2021-03-22 NOTE — Assessment & Plan Note (Signed)
Increase Pantoprazole to 40 mg BID Avoid foods that trigger your reflux Encouraged weight loss as this can help reduce reflux symptoms

## 2021-03-26 ENCOUNTER — Other Ambulatory Visit: Payer: Self-pay | Admitting: Pulmonary Disease

## 2021-06-06 ENCOUNTER — Other Ambulatory Visit: Payer: Self-pay | Admitting: Family Medicine

## 2021-06-06 DIAGNOSIS — J4541 Moderate persistent asthma with (acute) exacerbation: Secondary | ICD-10-CM

## 2021-06-06 NOTE — Telephone Encounter (Signed)
Requested Prescriptions  Pending Prescriptions Disp Refills  . montelukast (SINGULAIR) 10 MG tablet [Pharmacy Med Name: Montelukast Sodium 10 MG Oral Tablet] 30 tablet 2    Sig: TAKE 1 TABLET BY MOUTH AT BEDTIME     Pulmonology:  Leukotriene Inhibitors Passed - 06/06/2021  5:18 PM      Passed - Valid encounter within last 12 months    Recent Outpatient Visits          2 months ago Primary hypertension   [facility], Salvadore Oxford, NP   3 months ago Hypertension, unspecified type   [facility] Gabriel Cirri, NP   4 months ago Morbid obesity [facility])   [facility] Gabriel Cirri, NP   8 months ago Hypertension, unspecified type   [facility], Jodelle Gross, FNP   9 months ago Hypertension, unspecified type   [facility], Jodelle Gross, FNP      Future Appointments            In 3 months Baity, Salvadore Oxford, NP [facility], [facility]

## 2021-06-09 ENCOUNTER — Other Ambulatory Visit: Payer: Self-pay

## 2021-06-09 DIAGNOSIS — J4551 Severe persistent asthma with (acute) exacerbation: Secondary | ICD-10-CM

## 2021-06-09 MED ORDER — ALBUTEROL SULFATE HFA 108 (90 BASE) MCG/ACT IN AERS: INHALATION_SPRAY | RESPIRATORY_TRACT | 6 refills | Status: DC

## 2021-06-13 DIAGNOSIS — J45909 Unspecified asthma, uncomplicated: Secondary | ICD-10-CM | POA: Diagnosis not present

## 2021-06-13 DIAGNOSIS — R0781 Pleurodynia: Secondary | ICD-10-CM | POA: Diagnosis not present

## 2021-06-13 DIAGNOSIS — R079 Chest pain, unspecified: Secondary | ICD-10-CM | POA: Diagnosis not present

## 2021-06-13 DIAGNOSIS — Y9311 Activity, swimming: Secondary | ICD-10-CM | POA: Diagnosis not present

## 2021-06-13 DIAGNOSIS — W228XXA Striking against or struck by other objects, initial encounter: Secondary | ICD-10-CM | POA: Diagnosis not present

## 2021-06-13 DIAGNOSIS — Z6839 Body mass index (BMI) 39.0-39.9, adult: Secondary | ICD-10-CM | POA: Diagnosis not present

## 2021-06-13 DIAGNOSIS — Z87891 Personal history of nicotine dependence: Secondary | ICD-10-CM | POA: Diagnosis not present

## 2021-06-13 DIAGNOSIS — Z8781 Personal history of (healed) traumatic fracture: Secondary | ICD-10-CM | POA: Diagnosis not present

## 2021-06-13 DIAGNOSIS — R072 Precordial pain: Secondary | ICD-10-CM | POA: Diagnosis not present

## 2021-07-04 ENCOUNTER — Other Ambulatory Visit: Payer: Self-pay | Admitting: Pulmonary Disease

## 2021-07-18 ENCOUNTER — Other Ambulatory Visit: Payer: Self-pay | Admitting: Family Medicine

## 2021-07-18 ENCOUNTER — Other Ambulatory Visit: Payer: Self-pay | Admitting: Internal Medicine

## 2021-07-18 DIAGNOSIS — J4551 Severe persistent asthma with (acute) exacerbation: Secondary | ICD-10-CM

## 2021-07-18 NOTE — Telephone Encounter (Signed)
Requesting Advair, already pended to provider, unable to attach to this encounter.

## 2021-07-18 NOTE — Telephone Encounter (Signed)
Requested medication (s) are due for refill today: yes  Requested medication (s) are on the active medication list: yes  Last refill:  03/27/21 #90 0 refills  Future visit scheduled: yes in 2 months   Notes to clinic:  last ordered by Georgetta Haber, MD do you want to refill Rx?     Requested Prescriptions  Pending Prescriptions Disp Refills   pantoprazole (PROTONIX) 40 MG tablet 90 tablet 0    Sig: Take 1 tablet (40 mg total) by mouth daily.     Gastroenterology: Proton Pump Inhibitors Passed - 07/18/2021  4:25 PM      Passed - Valid encounter within last 12 months    Recent Outpatient Visits           3 months ago Primary hypertension   [facility], Salvadore Oxford, NP   4 months ago Hypertension, unspecified type   [facility] Gabriel Cirri, NP   5 months ago Morbid obesity [facility])   [facility] Gabriel Cirri, NP   10 months ago Hypertension, unspecified type   [facility], Jodelle Gross, FNP   10 months ago Hypertension, unspecified type   [facility], Jodelle Gross, FNP       Future Appointments             In 2 months Baity, Salvadore Oxford, NP [facility], [facility]

## 2021-07-18 NOTE — Telephone Encounter (Signed)
Copied from CRM [id number]. Topic: Quick Communication - Rx Refill/Question >> Jul 18, 2021  1:17 PM Jaquita Rector A wrote: Medication: Fluticasone-Salmeterol (ADVAIR DISKUS) 100-50 MCG/DOSE AEPB, pantoprazole (PROTONIX) 40 MG tablet   Has the patient contacted their pharmacy? No. (Agent: If no, request that the patient contact the pharmacy for the refill.) (Agent: If yes, when and what did the pharmacy advise?)  Preferred Pharmacy (with phone number or street name): [id number] - [place], [place] - [address]  Phone:  [phone] Fax:  [phone]     Agent: Please be advised that RX refills may take up to 3 business days. We ask that you follow-up with your pharmacy.

## 2021-07-19 MED ORDER — PANTOPRAZOLE SODIUM 40 MG PO TBEC: 40.0000 mg | DELAYED_RELEASE_TABLET | Freq: Every day | ORAL | 0 refills | Status: DC

## 2021-07-30 ENCOUNTER — Other Ambulatory Visit: Payer: Self-pay | Admitting: Unknown Physician Specialty

## 2021-07-31 NOTE — Telephone Encounter (Signed)
Requested medication (s) are due for refill today: yes  Requested medication (s) are on the active medication list: yes  Last refill:  01/24/21 #90 1 RF  Future visit scheduled: yes  Notes to clinic:  overdue lab work    Requested Prescriptions  Pending Prescriptions Disp Refills   valsartan (DIOVAN) 160 MG tablet [Pharmacy Med Name: Valsartan 160 MG Oral Tablet] 90 tablet 0    Sig: Take 1 tablet by mouth once daily     Cardiovascular:  Angiotensin Receptor Blockers Failed - 07/30/2021 10:11 AM      Failed - Cr in normal range and within 180 days    Creat  Date Value Ref Range Status  05/23/2020 1.02 0.60 - 1.35 mg/dL Final   Creatinine, Ser  Date Value Ref Range Status  07/01/2020 0.85 0.61 - 1.24 mg/dL Final          Failed - K in normal range and within 180 days    Potassium  Date Value Ref Range Status  07/01/2020 4.3 3.5 - 5.1 mmol/L Final          Passed - Patient is not pregnant      Passed - Last BP in normal range    BP Readings from Last 1 Encounters:  03/21/21 138/80          Passed - Valid encounter within last 6 months    Recent Outpatient Visits           4 months ago Primary hypertension   [facility] [place] [facility], Salvadore Oxford, NP   5 months ago Hypertension, unspecified type   [facility] Gabriel Cirri, NP   6 months ago Morbid obesity [facility])   [facility] Gabriel Cirri, NP   10 months ago Hypertension, unspecified type   [facility], Jodelle Gross, FNP   10 months ago Hypertension, unspecified type   [facility], Jodelle Gross, FNP       Future Appointments             In 1 month Baity, Salvadore Oxford, NP [facility], [facility]

## 2021-08-20 ENCOUNTER — Other Ambulatory Visit: Payer: Self-pay | Admitting: Pulmonary Disease

## 2021-08-20 ENCOUNTER — Other Ambulatory Visit: Payer: Self-pay | Admitting: Family Medicine

## 2021-08-20 ENCOUNTER — Other Ambulatory Visit: Payer: Self-pay | Admitting: Internal Medicine

## 2021-08-20 DIAGNOSIS — J4551 Severe persistent asthma with (acute) exacerbation: Secondary | ICD-10-CM

## 2021-08-20 NOTE — Telephone Encounter (Signed)
Requested Prescriptions  Pending Prescriptions Disp Refills  . fluticasone-salmeterol (ADVAIR) 100-50 MCG/ACT AEPB [Pharmacy Med Name: Fluticasone-Salmeterol 100-50 MCG/DOSE Inhalation Aerosol Powder Breath Activated] 60 each 0    Sig: INHALE 1 DOSE BY MOUTH IN THE MORNING AND  1 DOSE AT BEDTIME     Pulmonology:  Combination Products Passed - 08/20/2021  9:55 AM      Passed - Valid encounter within last 12 months    Recent Outpatient Visits          5 months ago Primary hypertension   [facility], Salvadore Oxford, NP   6 months ago Hypertension, unspecified type   [facility] Gabriel Cirri, NP   6 months ago Morbid obesity [facility]   [facility] Gabriel Cirri, NP   11 months ago Hypertension, unspecified type   [facility], Jodelle Gross, FNP   11 months ago Hypertension, unspecified type   [facility], Jodelle Gross, FNP      Future Appointments            In 1 month Baity, Salvadore Oxford, NP [facility], [facility]

## 2021-08-20 NOTE — Telephone Encounter (Signed)
Refilled today 08/20/21 by PCP

## 2021-09-26 ENCOUNTER — Encounter: Payer: BC Managed Care – PPO | Admitting: Internal Medicine

## 2021-10-04 ENCOUNTER — Encounter: Payer: BC Managed Care – PPO | Admitting: Internal Medicine

## 2021-10-24 ENCOUNTER — Ambulatory Visit (INDEPENDENT_AMBULATORY_CARE_PROVIDER_SITE_OTHER): Payer: BC Managed Care – PPO | Admitting: Internal Medicine

## 2021-10-24 ENCOUNTER — Other Ambulatory Visit: Payer: Self-pay

## 2021-10-24 ENCOUNTER — Encounter: Payer: Self-pay | Admitting: Internal Medicine

## 2021-10-24 ENCOUNTER — Ambulatory Visit
Admission: RE | Admit: 2021-10-24 | Discharge: 2021-10-24 | Disposition: A | Discharge status: Home / Self Care | Payer: BC Managed Care – PPO | Source: Ambulatory Visit | Attending: Internal Medicine | Admitting: Internal Medicine

## 2021-10-24 VITALS — BP 144/76 | HR 70 | Temp 97.5°F | Ht 64.0 in | Wt 236.8 lb

## 2021-10-24 DIAGNOSIS — E782 Mixed hyperlipidemia: Secondary | ICD-10-CM | POA: Diagnosis not present

## 2021-10-24 DIAGNOSIS — R202 Paresthesia of skin: Secondary | ICD-10-CM

## 2021-10-24 DIAGNOSIS — Z23 Encounter for immunization: Secondary | ICD-10-CM

## 2021-10-24 DIAGNOSIS — Z114 Encounter for screening for human immunodeficiency virus [HIV]: Secondary | ICD-10-CM

## 2021-10-24 DIAGNOSIS — R200 Anesthesia of skin: Secondary | ICD-10-CM | POA: Diagnosis not present

## 2021-10-24 DIAGNOSIS — I10 Essential (primary) hypertension: Secondary | ICD-10-CM | POA: Diagnosis not present

## 2021-10-24 DIAGNOSIS — Z1159 Encounter for screening for other viral diseases: Secondary | ICD-10-CM

## 2021-10-24 DIAGNOSIS — Z0001 Encounter for general adult medical examination with abnormal findings: Secondary | ICD-10-CM | POA: Diagnosis not present

## 2021-10-24 DIAGNOSIS — R7303 Prediabetes: Secondary | ICD-10-CM | POA: Diagnosis not present

## 2021-10-24 DIAGNOSIS — R29890 Loss of height: Secondary | ICD-10-CM | POA: Diagnosis not present

## 2021-10-24 DIAGNOSIS — E6609 Other obesity due to excess calories: Secondary | ICD-10-CM | POA: Insufficient documentation

## 2021-10-24 DIAGNOSIS — M40204 Unspecified kyphosis, thoracic region: Secondary | ICD-10-CM | POA: Diagnosis not present

## 2021-10-24 MED ORDER — AMLODIPINE BESYLATE 10 MG PO TABS: 10.0000 mg | ORAL_TABLET | Freq: Every day | ORAL | 1 refills | Status: DC

## 2021-10-24 MED ORDER — VALSARTAN-HYDROCHLOROTHIAZIDE 160-12.5 MG PO TABS: 1.0000 | ORAL_TABLET | Freq: Every day | ORAL | 1 refills | Status: DC

## 2021-10-24 NOTE — Progress Notes (Signed)
Subjective:    Patient ID: Miguel Rail., male    DOB: April 25, 1991, 30 y.o.   MRN: [id number]  HPI  Pt presents to the clinic today for his annual exam.  Flu: 08/2018 Tetanus: 12/2018 Covid: never Dentist: as needed  Diet: He does eat meat. He does not consumes fruits and veggies. He does eat some fried foods. He drinks mostly sweet tea and gatorade. Exercise: None  Review of Systems  Past Medical History:  Diagnosis Date   Asthma     Current Outpatient Medications  Medication Sig Dispense Refill   albuterol (VENTOLIN HFA) 108 (90 Base) MCG/ACT inhaler INHALE 1 TO 2 PUFFS BY MOUTH EVERY 6 HOURS AS NEEDED FOR WHEEZING FOR SHORTNESS OF BREATH 18 g 6   amLODipine (NORVASC) 5 MG tablet Take 1 tablet (5 mg total) by mouth daily. 90 tablet 3   fluticasone-salmeterol (ADVAIR) 100-50 MCG/ACT AEPB INHALE 1 DOSE BY MOUTH IN THE MORNING AND  1 DOSE AT BEDTIME 60 each 0   hydrochlorothiazide (HYDRODIURIL) 12.5 MG tablet Take 1 tablet (12.5 mg total) by mouth daily. To add to his current losartan-HCTZ for edema 90 tablet 3   methocarbamol (ROBAXIN) 500 MG tablet Take 1 tablet (500 mg total) by mouth at bedtime as needed for muscle spasms. 30 tablet 0   montelukast (SINGULAIR) 10 MG tablet TAKE 1 TABLET BY MOUTH AT BEDTIME 30 tablet 2   pantoprazole (PROTONIX) 40 MG tablet Take 1 tablet (40 mg total) by mouth daily. 90 tablet 0   valsartan (DIOVAN) 160 MG tablet Take 1 tablet by mouth once daily 90 tablet 0   No current facility-administered medications for this visit.    No Known Allergies  Family History  Problem Relation Age of Onset   Hypertension Mother     Social History   Socioeconomic History   Marital status: Single    Spouse name: Not on file   Number of children: Not on file   Years of education: Not on file   Highest education level: Not on file  Occupational History   Not on file  Tobacco Use   Smoking status: Every Day    Packs/day: 1.00    Years: 11.00     Pack years: 11.00    Types: Cigarettes   Smokeless tobacco: Never  Vaping Use   Vaping Use: Never used  Substance and Sexual Activity   Alcohol use: Yes    Alcohol/week: 8.0 standard drinks    Types: 8 Standard drinks or equivalent per week    Comment: 8 standard on a weekly basis    Drug use: No   Sexual activity: Yes  Other Topics Concern   Not on file  Social History Narrative   Not on file   Social Determinants of Health   Financial Resource Strain: Not on file  Food Insecurity: Not on file  Transportation Needs: Not on file  Physical Activity: Not on file  Stress: Not on file  Social Connections: Not on file  Intimate Partner Violence: Not on file     Constitutional: Denies fever, malaise, fatigue, headache or abrupt weight changes.  HEENT: Denies eye pain, eye redness, ear pain, ringing in the ears, wax buildup, runny nose, nasal congestion, bloody nose, or sore throat. Respiratory: Denies difficulty breathing, shortness of breath, cough or sputum production.   Cardiovascular: Denies chest pain, chest tightness, palpitations or swelling in the hands or feet.  Gastrointestinal: Denies abdominal pain, bloating, constipation, diarrhea or blood in  the stool.  GU: Denies urgency, frequency, pain with urination, burning sensation, blood in urine, odor or discharge. Musculoskeletal: Pt reports numb sensation in left upper back. Denies decrease in range of motion, difficulty with gait, muscle pain or joint pain and swelling.  Skin: Denies redness, rashes, lesions or ulcercations.  Neurological: Denies dizziness, difficulty with memory, difficulty with speech or problems with balance and coordination.  Psych: Denies anxiety, depression, SI/HI.  No other specific complaints in a complete review of systems (except as listed in HPI above).     Objective:   Physical Exam BP (!) 147/80 (BP Location: Right Arm, Patient Position: Sitting, Cuff Size: Large)    Pulse 70    Temp  (!) 97.5 F (36.4 C) (Temporal)    Ht 5\' 4"  (1.626 m)    Wt 236 lb 12.8 oz (107.4 kg)    SpO2 100%    BMI 40.65 kg/m   Wt Readings from Last 3 Encounters:  03/21/21 242 lb 6.4 oz (110 kg)  02/21/21 243 lb 6.4 oz (110.4 kg)  01/24/21 240 lb 6.4 oz (109 kg)    General: Appears his stated age, obese, in NAD. Skin: Warm, dry and intact.  HEENT: Head: normal shape and size; Eyes: sclera white and EOMs intact;  Neck:  Neck supple, trachea midline. No masses, lumps or thyromegaly present.  Cardiovascular: Normal rate and rhythm. S1,S2 noted.  No murmur, rubs or gallops noted. Trace pitting BLE edema.  Pulmonary/Chest: Normal effort and positive vesicular breath sounds. No respiratory distress. No wheezes, rales or ronchi noted.  Abdomen: Soft and nontender. Normal bowel sounds. Ventral hernia noted. Liver, spleen and kidneys non palpable. Musculoskeletal: Strength 5/5 BUE/BLE. No bony tenderness noted over the thoracic spine. Numbness noted of the left subscapular area. No difficulty with gait.  Neurological: Alert and oriented. Cranial nerves II-XII grossly intact. Coordination normal.  Psychiatric: Mood and affect normal. Behavior is normal. Judgment and thought content normal.    BMET    Component Value Date/Time   NA 137 07/01/2020 1104   K 4.3 07/01/2020 1104   CL 101 07/01/2020 1104   CO2 25 07/01/2020 1104   GLUCOSE 107 (H) 07/01/2020 1104   BUN 18 07/01/2020 1104   CREATININE 0.85 07/01/2020 1104   CREATININE 1.02 05/23/2020 1533   CALCIUM 8.7 (L) 07/01/2020 1104   GFRNONAA >60 07/01/2020 1104   GFRNONAA 100 05/23/2020 1533   GFRAA >60 07/01/2020 1104   GFRAA 115 05/23/2020 1533    Lipid Panel  No results found for: CHOL, TRIG, HDL, CHOLHDL, VLDL, LDLCALC  CBC    Component Value Date/Time   WBC 5.9 07/01/2020 1104   RBC 5.39 07/01/2020 1104   HGB 15.7 07/01/2020 1104   HCT 46.3 07/01/2020 1104   PLT 202 07/01/2020 1104   MCV 85.9 07/01/2020 1104   MCH 29.1  07/01/2020 1104   MCHC 33.9 07/01/2020 1104   RDW 13.2 07/01/2020 1104   LYMPHSABS 1.5 07/01/2020 1104   MONOABS 0.5 07/01/2020 1104   EOSABS 0.2 07/01/2020 1104   BASOSABS 0.0 07/01/2020 1104    Hgb A1C Lab Results  Component Value Date   HGBA1C 5.8 (A) 01/24/2021           Assessment & Plan:   Preventative Health Maintenance:  Flu shot today Tetanus UTD Encouraged him to get his covid booster Encouraged him to consume a balanced diet and exercise regimen Advised him to see a dentist annually Will check CBC, CMET, Lipid, A1C, HIV and  Hep C today  Paresthesia of Left Upper Back:  Xray thoracic spine today Encouraged stretching and massage  Consider PT vs MRI if symptoms persist or worsen  RTC in 2 weeks for BP check, 6 months, follow up chronic conditions Nicki Reaper, NP This visit occurred during the SARS-CoV-2 public health emergency.  Safety protocols were in place, including screening questions prior to the visit, additional usage of staff PPE, and extensive cleaning of exam room while observing appropriate contact time as indicated for disinfecting solutions.

## 2021-10-24 NOTE — Assessment & Plan Note (Signed)
Encouraged diet and exercise for weight loss ?

## 2021-10-24 NOTE — Patient Instructions (Signed)

## 2021-10-24 NOTE — Assessment & Plan Note (Addendum)
Elevated today Manual repeat Valsartan HCT refilled Increase Amlodipine to 10 mg daily Reinforced DASH diet and exercise for weight loss CMET today  RTC in 2 weeks for BP check

## 2021-10-25 LAB — COMPLETE METABOLIC PANEL WITH GFR
AG Ratio: 1.9 (calc) (ref 1.0–2.5)
ALT: 29 U/L (ref 9–46)
AST: 23 U/L (ref 10–40)
Albumin: 4.4 g/dL (ref 3.6–5.1)
Alkaline phosphatase (APISO): 53 U/L (ref 36–130)
BUN: 18 mg/dL (ref 7–25)
CO2: 29 mmol/L (ref 20–32)
Calcium: 8.8 mg/dL (ref 8.6–10.3)
Chloride: 104 mmol/L (ref 98–110)
Creat: 0.89 mg/dL (ref 0.60–1.24)
Globulin: 2.3 g/dL (calc) (ref 1.9–3.7)
Glucose, Bld: 94 mg/dL (ref 65–139)
Potassium: 4.3 mmol/L (ref 3.5–5.3)
Sodium: 141 mmol/L (ref 135–146)
Total Bilirubin: 0.8 mg/dL (ref 0.2–1.2)
Total Protein: 6.7 g/dL (ref 6.1–8.1)
eGFR: 119 mL/min/{1.73_m2} (ref >=60)

## 2021-10-25 LAB — CBC
HCT: 42.3 % (ref 38.5–50.0)
Hemoglobin: 14.4 g/dL (ref 13.2–17.1)
MCH: 28.3 pg (ref 27.0–33.0)
MCHC: 34 g/dL (ref 32.0–36.0)
MCV: 83.1 fL (ref 80.0–100.0)
MPV: 12.7 fL — ABNORMAL HIGH (ref 7.5–12.5)
Platelets: 200 10*3/uL (ref 140–400)
RBC: 5.09 10*6/uL (ref 4.20–5.80)
RDW: 13.2 % (ref 11.0–15.0)
WBC: 5.9 10*3/uL (ref 3.8–10.8)

## 2021-10-25 LAB — LIPID PANEL
Cholesterol: 215 mg/dL — ABNORMAL HIGH (ref <200)
HDL: 39 mg/dL — ABNORMAL LOW (ref >=40)
Non-HDL Cholesterol (Calc): 176 mg/dL (calc) — ABNORMAL HIGH (ref <130)
Total CHOL/HDL Ratio: 5.5 (calc) — ABNORMAL HIGH (ref <5.0)
Triglycerides: 473 mg/dL — ABNORMAL HIGH (ref <150)

## 2021-10-25 LAB — HIV ANTIBODY (ROUTINE TESTING W REFLEX): HIV 1&2 Ab, 4th Generation: NONREACTIVE

## 2021-10-25 LAB — HEMOGLOBIN A1C
Hgb A1c MFr Bld: 5.6 % of total Hgb (ref <5.7)
Mean Plasma Glucose: 114 mg/dL
eAG (mmol/L): 6.3 mmol/L

## 2021-10-25 LAB — HEPATITIS C ANTIBODY
Hepatitis C Ab: NONREACTIVE
SIGNAL TO CUT-OFF: 0.03 (ref <1.00)

## 2021-11-09 ENCOUNTER — Ambulatory Visit: Payer: BC Managed Care – PPO | Admitting: Internal Medicine

## 2021-11-09 ENCOUNTER — Encounter: Payer: Self-pay | Admitting: Internal Medicine

## 2021-11-09 ENCOUNTER — Other Ambulatory Visit: Payer: Self-pay

## 2021-11-09 VITALS — BP 129/74 | HR 70 | Temp 97.3°F | Resp 18 | Ht 64.0 in | Wt 235.8 lb

## 2021-11-09 DIAGNOSIS — E782 Mixed hyperlipidemia: Secondary | ICD-10-CM | POA: Diagnosis not present

## 2021-11-09 DIAGNOSIS — I10 Essential (primary) hypertension: Secondary | ICD-10-CM

## 2021-11-09 DIAGNOSIS — G8929 Other chronic pain: Secondary | ICD-10-CM | POA: Diagnosis not present

## 2021-11-09 DIAGNOSIS — E1165 Type 2 diabetes mellitus with hyperglycemia: Secondary | ICD-10-CM | POA: Insufficient documentation

## 2021-11-09 DIAGNOSIS — M546 Pain in thoracic spine: Secondary | ICD-10-CM

## 2021-11-09 MED ORDER — VALSARTAN-HYDROCHLOROTHIAZIDE 160-12.5 MG PO TABS: 1.0000 | ORAL_TABLET | Freq: Every day | ORAL | 3 refills | Status: DC

## 2021-11-09 MED ORDER — FLUTICASONE-SALMETEROL 100-50 MCG/ACT IN AEPB: 1.0000 | INHALATION_SPRAY | Freq: Two times a day (BID) | RESPIRATORY_TRACT | 3 refills | Status: DC

## 2021-11-09 MED ORDER — ATORVASTATIN CALCIUM 10 MG PO TABS
10.0000 mg | ORAL_TABLET | Freq: Every day | ORAL | 0 refills | Status: DC
Start: 2021-11-09 — End: 2022-02-08

## 2021-11-09 MED ORDER — AMLODIPINE BESYLATE 10 MG PO TABS: 10.0000 mg | ORAL_TABLET | Freq: Every day | ORAL | 3 refills | Status: DC

## 2021-11-09 NOTE — Assessment & Plan Note (Signed)
X-ray reviewed  MRI ordered

## 2021-11-09 NOTE — Patient Instructions (Signed)

## 2021-11-09 NOTE — Progress Notes (Signed)
Subjective:    Patient ID: Miguel Perez., male    DOB: 1991-07-05, 31 y.o.   MRN: [id number]  HPI  Pt presents to the clinic today for 2 week follow up of HTN. At his last visit, Amlodipine was increased to 10 mg in addition to Valsartan HCT. He has been taking the medication as prescribed and denies adverse side effects. His BP today is 129/74. ECG from 07/2020 reviewed.  He also reports that he was agreeable to start cholesterol medication however this was not ever sent into his pharmacy.  He is currently not consume a low-fat diet.  He also wants to follow-up on his thoracic x-ray from 10/2021 which showed T7-T9 wedge compression deformity.  He would like to proceed with the MRI.  Review of Systems     Past Medical History:  Diagnosis Date   Asthma     Current Outpatient Medications  Medication Sig Dispense Refill   albuterol (VENTOLIN HFA) 108 (90 Base) MCG/ACT inhaler INHALE 1 TO 2 PUFFS BY MOUTH EVERY 6 HOURS AS NEEDED FOR WHEEZING FOR SHORTNESS OF BREATH 18 g 6   amLODipine (NORVASC) 10 MG tablet Take 1 tablet (10 mg total) by mouth daily. 90 tablet 1   montelukast (SINGULAIR) 10 MG tablet TAKE 1 TABLET BY MOUTH AT BEDTIME 30 tablet 2   valsartan (DIOVAN) 160 MG tablet Take 1 tablet by mouth once daily 90 tablet 0   valsartan-hydrochlorothiazide (DIOVAN-HCT) 160-12.5 MG tablet Take 1 tablet by mouth daily. 90 tablet 1   No current facility-administered medications for this visit.    No Known Allergies  Family History  Problem Relation Age of Onset   Hypertension Mother     Social History   Socioeconomic History   Marital status: Single    Spouse name: Not on file   Number of children: Not on file   Years of education: Not on file   Highest education level: Not on file  Occupational History   Not on file  Tobacco Use   Smoking status: Every Day    Packs/day: 1.00    Years: 11.00    Pack years: 11.00    Types: Cigarettes   Smokeless tobacco: Never   Vaping Use   Vaping Use: Never used  Substance and Sexual Activity   Alcohol use: Yes    Alcohol/week: 8.0 standard drinks    Types: 8 Standard drinks or equivalent per week    Comment: 8 standard on a weekly basis    Drug use: No   Sexual activity: Yes  Other Topics Concern   Not on file  Social History Narrative   Not on file   Social Determinants of Health   Financial Resource Strain: Not on file  Food Insecurity: Not on file  Transportation Needs: Not on file  Physical Activity: Not on file  Stress: Not on file  Social Connections: Not on file  Intimate Partner Violence: Not on file     Constitutional: Denies fever, malaise, fatigue, headache or abrupt weight changes.  Respiratory: Denies difficulty breathing, shortness of breath, cough or sputum production.   Cardiovascular: Denies chest pain, chest tightness, palpitations or swelling in the hands or feet.  Musculoskeletal: Patient reports chronic thoracic back pain.  Denies decrease in range of motion, difficulty with gait, muscle pain or joint swelling.  Skin: Denies redness, rashes, lesions or ulcercations.  Neurological: Denies numbness, tingling or problems with balance and coordination.    No other specific complaints in a  complete review of systems (except as listed in HPI above).  Objective:   Physical Exam   BP 129/74 (BP Location: Left Arm, Patient Position: Sitting, Cuff Size: Large)    Pulse 70    Temp (!) 97.3 F (36.3 C) (Temporal)    Resp 18    Ht 5\' 4"  (1.626 m)    Wt 235 lb 12.8 oz (107 kg)    SpO2 99%    BMI 40.47 kg/m   Wt Readings from Last 3 Encounters:  10/24/21 236 lb 12.8 oz (107.4 kg)  03/21/21 242 lb 6.4 oz (110 kg)  02/21/21 243 lb 6.4 oz (110.4 kg)    General: Appears his stated age, obese, in NAD. Cardiovascular: Normal rate and rhythm. S1,S2 noted.  No murmur, rubs or gallops noted. No JVD or BLE edema.  Pulmonary/Chest: Normal effort and positive vesicular breath sounds. No  respiratory distress. No wheezes, rales or ronchi noted.  Musculoskeletal: Pain with palpation of the thoracic spine.  No difficulty with gait.  Neurological: Alert and oriented.    BMET    Component Value Date/Time   NA 141 10/24/2021 1501   K 4.3 10/24/2021 1501   CL 104 10/24/2021 1501   CO2 29 10/24/2021 1501   GLUCOSE 94 10/24/2021 1501   BUN 18 10/24/2021 1501   CREATININE 0.89 10/24/2021 1501   CALCIUM 8.8 10/24/2021 1501   GFRNONAA >60 07/01/2020 1104   GFRNONAA 100 05/23/2020 1533   GFRAA >60 07/01/2020 1104   GFRAA 115 05/23/2020 1533    Lipid Panel     Component Value Date/Time   CHOL 215 (H) 10/24/2021 1501   TRIG 473 (H) 10/24/2021 1501   HDL 39 (L) 10/24/2021 1501   CHOLHDL 5.5 (H) 10/24/2021 1501   LDLCALC  10/24/2021 1501     Comment:     . LDL cholesterol not calculated. Triglyceride levels greater than 400 mg/dL invalidate calculated LDL results. . Reference range: <100 . Desirable range <100 mg/dL for primary prevention;   <70 mg/dL for patients with CHD or diabetic patients  with > or = 2 CHD risk factors. 10/26/2021 LDL-C is now calculated using the Martin-Hopkins  calculation, which is a validated novel method providing  better accuracy than the Friedewald equation in the  estimation of LDL-C.  Marland Kitchen et al. Horald Pollen. Lenox Ahr): 2061-2068  (http://education.[facility].com/faq/FAQ164)     CBC    Component Value Date/Time   WBC 5.9 10/24/2021 1501   RBC 5.09 10/24/2021 1501   HGB 14.4 10/24/2021 1501   HCT 42.3 10/24/2021 1501   PLT 200 10/24/2021 1501   MCV 83.1 10/24/2021 1501   MCH 28.3 10/24/2021 1501   MCHC 34.0 10/24/2021 1501   RDW 13.2 10/24/2021 1501   LYMPHSABS 1.5 07/01/2020 1104   MONOABS 0.5 07/01/2020 1104   EOSABS 0.2 07/01/2020 1104   BASOSABS 0.0 07/01/2020 1104    Hgb A1C Lab Results  Component Value Date   HGBA1C 5.6 10/24/2021           Assessment & Plan:    10/26/2021, NP This visit occurred  during the SARS-CoV-2 public health emergency.  Safety protocols were in place, including screening questions prior to the visit, additional usage of staff PPE, and extensive cleaning of exam room while observing appropriate contact time as indicated for disinfecting solutions.

## 2021-11-09 NOTE — Assessment & Plan Note (Signed)
Controlled on Valsartan HCT and Amlodipine Reinforced DASH diet and exercise for weight loss

## 2021-11-09 NOTE — Assessment & Plan Note (Signed)
Rx for Atorvastatin 10 mg daily Reinforced DASH diet and exercise for weight loss  RTC in 3 months for lab only lipid

## 2021-11-11 ENCOUNTER — Encounter: Payer: Self-pay | Admitting: Emergency Medicine

## 2021-11-11 ENCOUNTER — Emergency Department
Admission: EM | Admit: 2021-11-11 | Discharge: 2021-11-11 | Disposition: A | Discharge status: Home / Self Care | Payer: BC Managed Care – PPO | Attending: Emergency Medicine | Admitting: Emergency Medicine

## 2021-11-11 ENCOUNTER — Other Ambulatory Visit: Payer: Self-pay

## 2021-11-11 DIAGNOSIS — M940 Chondrocostal junction syndrome [Tietze]: Secondary | ICD-10-CM | POA: Insufficient documentation

## 2021-11-11 DIAGNOSIS — F17200 Nicotine dependence, unspecified, uncomplicated: Secondary | ICD-10-CM | POA: Insufficient documentation

## 2021-11-11 DIAGNOSIS — R0781 Pleurodynia: Secondary | ICD-10-CM | POA: Diagnosis not present

## 2021-11-11 MED ORDER — BENZONATATE 100 MG PO CAPS: 100.0000 mg | ORAL_CAPSULE | Freq: Three times a day (TID) | ORAL | 0 refills | Status: DC | PRN

## 2021-11-11 MED ORDER — KETOROLAC TROMETHAMINE 10 MG PO TABS: 10.0000 mg | ORAL_TABLET | Freq: Four times a day (QID) | ORAL | 0 refills | Status: DC | PRN

## 2021-11-11 MED ORDER — KETOROLAC TROMETHAMINE 30 MG/ML IJ SOLN
30.0000 mg | Freq: Once | INTRAMUSCULAR | Status: AC
Start: 2021-11-11 — End: 2021-11-11
  Administered 2021-11-11: 30 mg via INTRAMUSCULAR
  Filled 2021-11-11: qty 1

## 2021-11-11 MED ORDER — METHOCARBAMOL 500 MG PO TABS: 500.0000 mg | ORAL_TABLET | Freq: Four times a day (QID) | ORAL | 0 refills | Status: DC

## 2021-11-11 NOTE — ED Notes (Signed)
See triage note. Pt sitting calmly on stretcher; resp reg/unlabored; skin dry; in NAD. Visitor remains at bedside.

## 2021-11-11 NOTE — ED Triage Notes (Signed)
Pt c/o right sided pain when he coughs, pt reports he has a "smoker's cough", no urinary s/s

## 2021-11-11 NOTE — ED Provider Notes (Signed)
[facility] Provider Note  Patient Contact: 11:05 PM (approximate)   History   Flank Pain   HPI  Miguel Perez. is a 31 y.o. male who presents the emergency department complaining of right rib pain.  Patient is a chronic cough from smoking.  He is in no significant increase in cough, URI symptoms such as nasal congestion, sore throat, fevers.  Patient had right lateral rib pain developed this morning.  It is worse with coughing or movement.  Is it is reproducible with palpation.  No shortness of breath.  No substernal pain.  No cardiac history.  No medications prior to arrival.  Medical history includes asthma, hypertension, GERD     Physical Exam   Triage Vital Signs: ED Triage Vitals  Enc Vitals Group     BP 11/11/21 2033 (!) 152/98     Pulse Rate 11/11/21 2033 73     Resp 11/11/21 2033 18     Temp 11/11/21 2033 97.7 F (36.5 C)     Temp Source 11/11/21 2033 Oral     SpO2 11/11/21 2033 97 %     Weight 11/11/21 2035 235 lb (106.6 kg)     Height 11/11/21 2035 5\' 4"  (1.626 m)     Head Circumference --      Peak Flow --      Pain Score 11/11/21 2034 6     Pain Loc --      Pain Edu? --      Excl. in [place]? --     Most recent vital signs: Vitals:   11/11/21 2033  BP: (!) 152/98  Pulse: 73  Resp: 18  Temp: 97.7 F (36.5 C)  SpO2: 97%     General: Alert and in no acute distress.    Cardiovascular:  Good peripheral perfusion.  No appreciable murmurs, rubs, gallops. Respiratory: Normal respiratory effort without tachypnea or retractions. Lungs CTAB. Good air entry to the bases with no decreased or absent breath sounds. Musculoskeletal: Full range of motion to all extremities.  Visualization of the ribs reveals no visible signs of trauma.  Palpation along the intercostal margins between ribs 10 and 11 reveals tenderness that is reproducible for the patient's pain.  No palpable abnormality.  No crepitus.  No subcutaneous emphysema.  Good Breath  sounds bilaterally. Neurologic:  No gross focal neurologic deficits are appreciated.  Skin:   No rash noted Other:   ED Results / Procedures / Treatments   Labs (all labs ordered are listed, but only abnormal results are displayed) Labs Reviewed - No data to display   EKG     RADIOLOGY   PROCEDURES:  Critical Care performed: No  Procedures   MEDICATIONS ORDERED IN ED: Medications  ketorolac (TORADOL) 30 MG/ML injection 30 mg (has no administration in time range)     IMPRESSION / MDM / ASSESSMENT AND PLAN / ED COURSE  I reviewed the triage vital signs and the nursing notes.                              Differential diagnosis includes, but is not limited to, rib fracture, costochondritis, pneumonia, bronchitis, cardiovascular source of chest pain   Patient's diagnosis is consistent with costochondritis.  Patient presents with a chronic cough.  No URI symptoms.  Patient has reproducible chest wall tenderness to the right ribs.  This reproduces patient's pain.  At this time he does have chronic  cough from smoking.  We discussed smoking cessation but we will treat for the costochondritis at this time.  If symptoms change or worsen patient is instructed to return to the emergency department.  At this time he will be treated with cough medication, muscle relaxer and anti-inflammatory.  Patient is given Toradol injection here in the emergency department.  At this time no indication for labs, imaging..  Patient does have a primary care, last seen in December 2022.  It is noted that patient has an 11-pack-year history by his primary care.  Follow-up primary care as needed.  Patient is given ED precautions to return to the ED for any worsening or new symptoms.       FINAL CLINICAL IMPRESSION(S) / ED DIAGNOSES   Final diagnoses:  Costochondritis, acute     Rx / DC Orders   ED Discharge Orders          Ordered    ketorolac (TORADOL) 10 MG tablet  Every 6 hours PRN         11/11/21 2311    methocarbamol (ROBAXIN) 500 MG tablet  4 times daily        11/11/21 2311    benzonatate (TESSALON PERLES) 100 MG capsule  3 times daily PRN        11/11/21 2311             Note:  This document was prepared using Dragon voice recognition software and may include unintentional dictation errors.   Brynda Peon 11/11/21 2314    Carrie Mew, MD 11/13/21 [phone]

## 2021-11-16 ENCOUNTER — Telehealth: Payer: Self-pay

## 2021-11-16 NOTE — Telephone Encounter (Signed)
The pt would like to hold off from getting physical therapy at this time. He verbalize understanding about the status of his MRI order.

## 2021-11-16 NOTE — Telephone Encounter (Signed)
Please let pt know his MRI was denied. Does he want to try some physical therapy?

## 2021-11-16 NOTE — Telephone Encounter (Signed)
[facility] referral coordinator informed me that his Thoracic spine MRI was denied.

## 2022-02-07 ENCOUNTER — Other Ambulatory Visit: Payer: BC Managed Care – PPO

## 2022-02-07 DIAGNOSIS — E782 Mixed hyperlipidemia: Secondary | ICD-10-CM | POA: Diagnosis not present

## 2022-02-08 ENCOUNTER — Other Ambulatory Visit: Payer: Self-pay

## 2022-02-08 LAB — COMPLETE METABOLIC PANEL WITH GFR
AG Ratio: 1.7 (calc) (ref 1.0–2.5)
ALT: 27 U/L (ref 9–46)
AST: 25 U/L (ref 10–40)
Albumin: 4.6 g/dL (ref 3.6–5.1)
Alkaline phosphatase (APISO): 55 U/L (ref 36–130)
BUN: 20 mg/dL (ref 7–25)
CO2: 29 mmol/L (ref 20–32)
Calcium: 9.4 mg/dL (ref 8.6–10.3)
Chloride: 103 mmol/L (ref 98–110)
Creat: 1.07 mg/dL (ref 0.60–1.24)
Globulin: 2.7 g/dL (calc) (ref 1.9–3.7)
Glucose, Bld: 85 mg/dL (ref 65–139)
Potassium: 4.1 mmol/L (ref 3.5–5.3)
Sodium: 140 mmol/L (ref 135–146)
Total Bilirubin: 0.8 mg/dL (ref 0.2–1.2)
Total Protein: 7.3 g/dL (ref 6.1–8.1)
eGFR: 96 mL/min/{1.73_m2} (ref >=60)

## 2022-02-08 LAB — LIPID PANEL
Cholesterol: 180 mg/dL (ref <200)
HDL: 48 mg/dL (ref >=40)
LDL Cholesterol (Calc): 95 mg/dL (calc)
Non-HDL Cholesterol (Calc): 132 mg/dL (calc) — ABNORMAL HIGH (ref <130)
Total CHOL/HDL Ratio: 3.8 (calc) (ref <5.0)
Triglycerides: 243 mg/dL — ABNORMAL HIGH (ref <150)

## 2022-02-08 MED ORDER — ATORVASTATIN CALCIUM 20 MG PO TABS: 20.0000 mg | ORAL_TABLET | Freq: Every day | ORAL | 0 refills | Status: DC

## 2022-03-22 IMAGING — DX DG THORACIC SPINE 2V
3 series · 3 of 3 positions shown · non-contrast
Comparison: No prior thoracic spine imaging available. Chest
radiograph 09/03/2020 reviewed

CLINICAL DATA: Paresthesia.  Numbness of left upper back.

Technologist notes state patient reports numbness spot under right
scapula for 6+ months.
EXAM:
THORACIC SPINE 2 VIEWS

[t-spine ap]
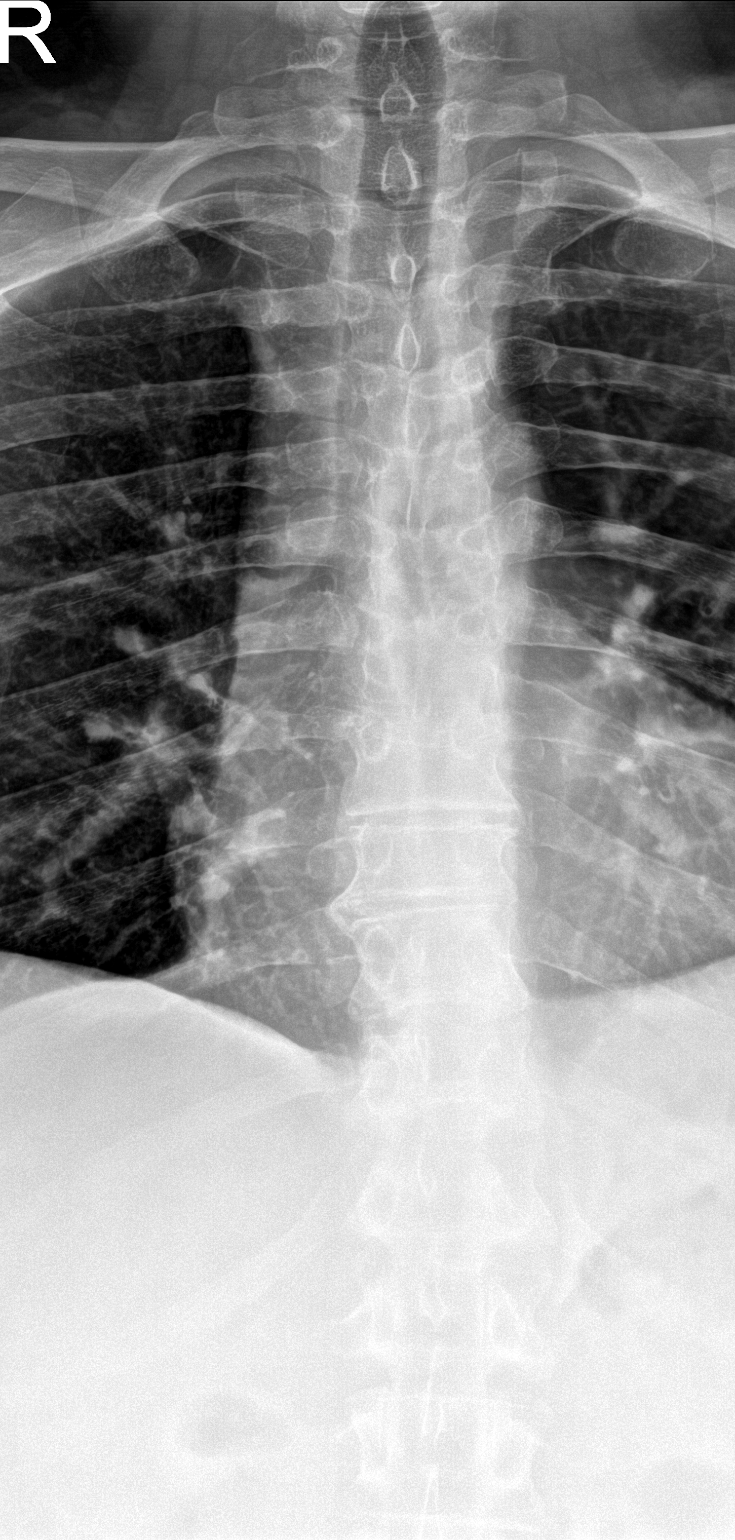

[t-spine lat]
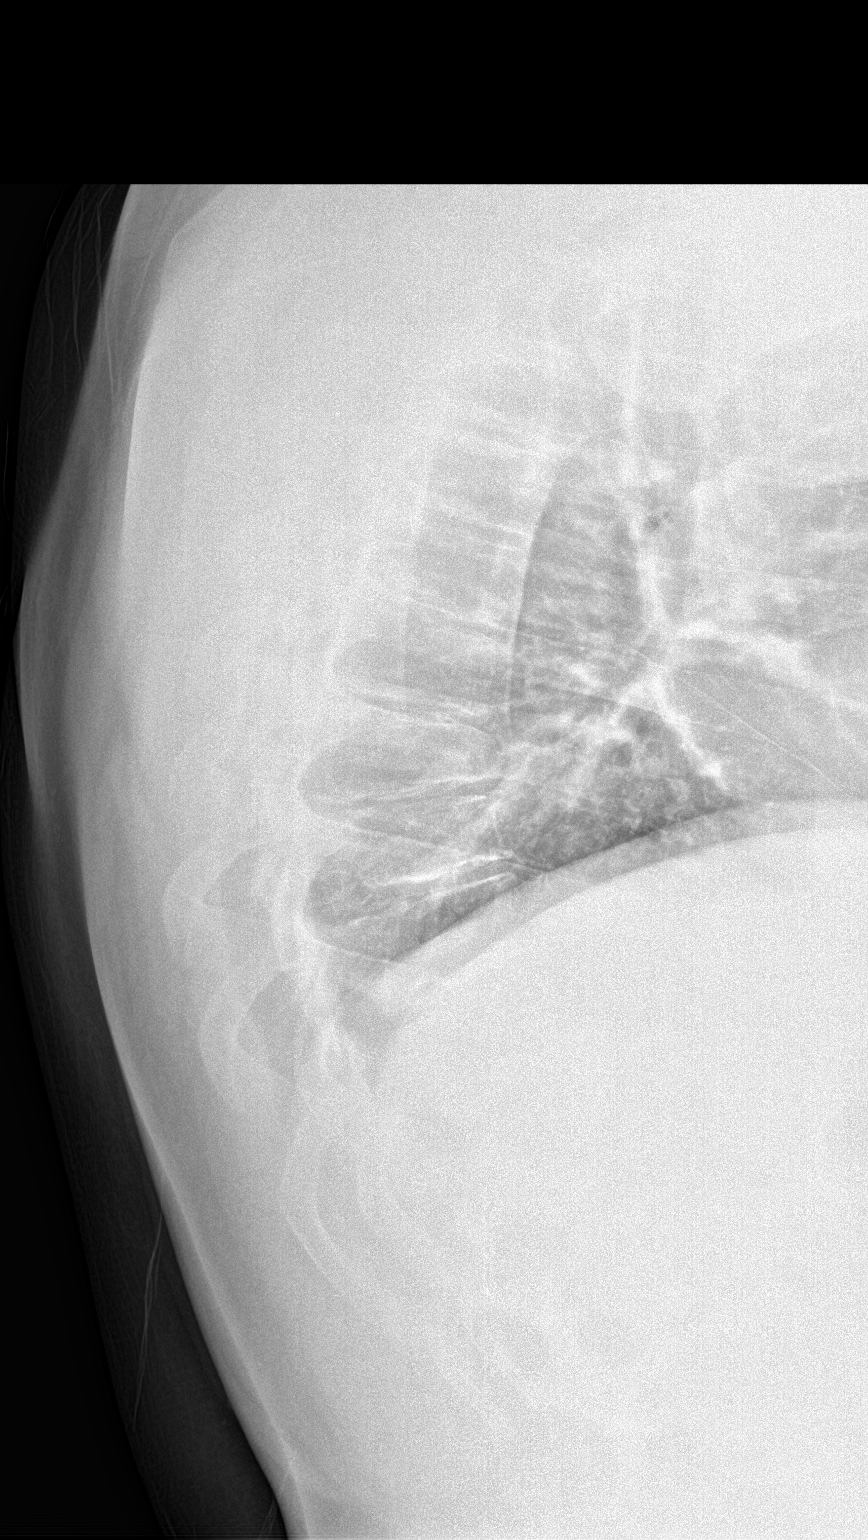

[t-spine swimmers]
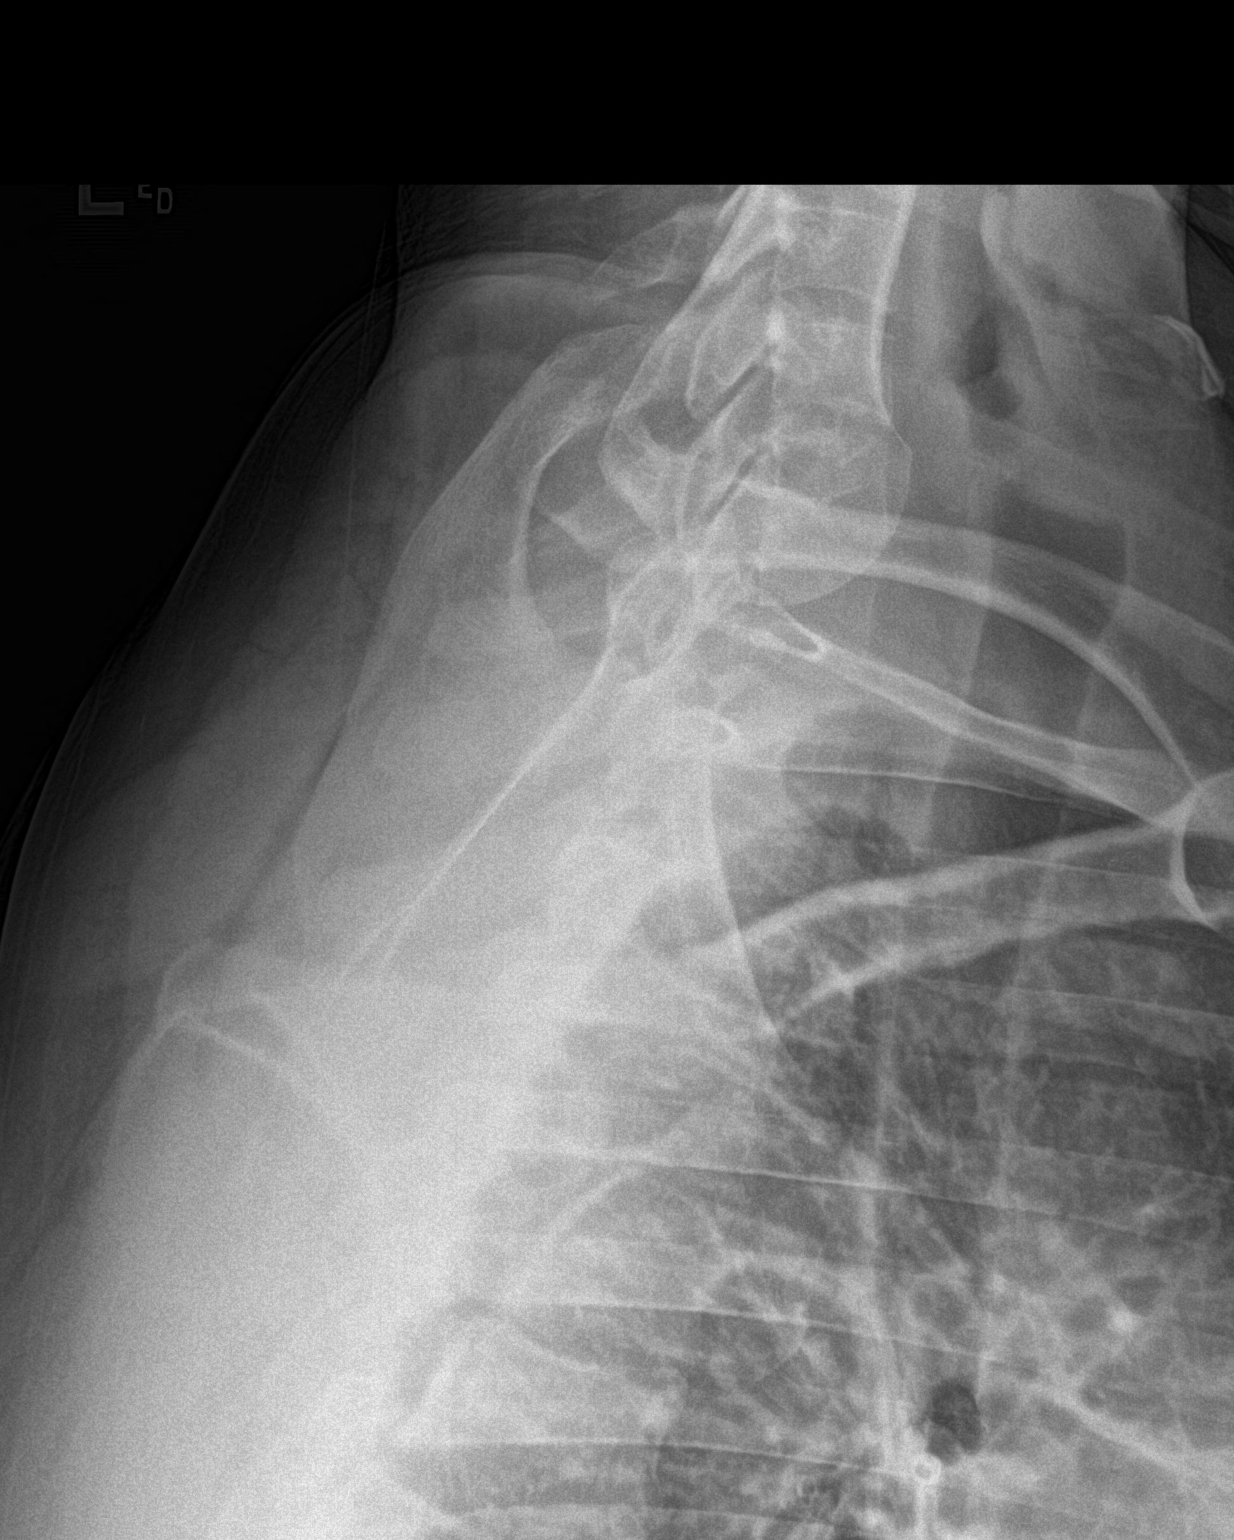

[3 of 3 positions shown; findings below may reference images not displayed]

FINDINGS: There are 12 rib-bearing thoracic vertebra. Anterior wedging of T8
and T9 vertebral bodies, with approximately 40% loss of height of T8
in 25% loss of height of T9. There may be also minimal loss of
height of T7 vertebra. This may have been present on prior chest
radiograph, although detailed assessment is limited by soft tissue
attenuation from habitus. Limited assessment of upper thoracic
vertebra due to overlapping structures. Slight exaggerated thoracic
kyphosis. No scoliotic curvature.
IMPRESSION: 1. Anterior wedge compression deformity of T8 and T9, and possibly
T7 vertebral body. These are likely chronic, and tentatively
visualized on 0805 chest radiograph, although detailed assessment is
limited due to soft tissue attenuation from habitus.
2. Consider further assessment with thoracic MRI.

## 2022-05-09 ENCOUNTER — Ambulatory Visit: Payer: Self-pay | Admitting: *Deleted

## 2022-05-09 NOTE — Telephone Encounter (Signed)
Summary: Ankles are swelling from kneecaps down.   Pt stated Ankles are swelling from kneecaps down. Pt stated feels pain when standing. Pt stated that this has been going on for about 2 weeks.    Pt seeking clinical advice.    No appointments are available with [facility].       Chief Complaint: swelling legs Symptoms: just swollen legs from knee down Frequency: constant but better since elevating and wearing support hose Pertinent Negatives: Patient denies SOB, CP Disposition: [] ED /[] Urgent Care (no appt availability in office) / [x] Appointment(In office/virtual)/ []  [facility]/ [] Home Care/ [] Refused Recommended Disposition /[] [facility]/ []  Follow-up with PCP Additional Notes: Pt offered earlier appt but did not want to come early in the morning. I did put on waiting list if any afternoon appt comes open.  Reason for Disposition  [1] MILD swelling of both ankles (i.e., pedal edema) AND [2] caused by hot weather  Answer Assessment - Initial Assessment Questions 1. ONSET: "When did the swelling start?" (e.g., minutes, hours, days)     2 weeks 2. LOCATION: "What part of the leg is swollen?"  "Are both legs swollen or just one leg?"     Knees down 3. SEVERITY: "How bad is the swelling?" (e.g., localized; mild, moderate, severe)  - Localized - small area of swelling localized to one leg  - MILD pedal edema - swelling limited to foot and ankle, pitting edema < 1/4 inch (6 mm) deep, rest and elevation eliminate most or all swelling  - MODERATE edema - swelling of lower leg to knee, pitting edema > 1/4 inch (6 mm) deep, rest and elevation only partially reduce swelling  - SEVERE edema - swelling extends above knee, facial or hand swelling present     Were pitting and worse but has been elevating and wearing support hose so they are better. 4. REDNESS: "Does the swelling look red or infected?"     no 5. PAIN: "Is the swelling painful to touch?" If Yes, ask: "How  painful is it?"   (Scale 1-10; mild, moderate or severe)     sometimes 6. FEVER: "Do you have a fever?" If Yes, ask: "What is it, how was it measured, and when did it start?"      no 7. CAUSE: "What do you think is causing the leg swelling?"     Fluid, this happened one time before 8. MEDICAL HISTORY: "Do you have a history of heart failure, kidney disease, liver failure, or cancer?"     no 9. RECURRENT SYMPTOM: "Have you had leg swelling before?" If Yes, ask: "When was the last time?" "What happened that time?"     2nd time 10. OTHER SYMPTOMS: "Do you have any other symptoms?" (e.g., chest pain, difficulty breathing)       no 11. PREGNANCY: "Is there any chance you are pregnant?" "When was your last menstrual period?"       no  Protocols used: Leg Swelling and Edema-A-AH

## 2022-05-15 ENCOUNTER — Encounter: Payer: Self-pay | Admitting: Family Medicine

## 2022-05-15 ENCOUNTER — Other Ambulatory Visit: Payer: Self-pay | Admitting: Family Medicine

## 2022-05-15 ENCOUNTER — Ambulatory Visit: Payer: BC Managed Care – PPO | Admitting: Family Medicine

## 2022-05-15 VITALS — BP 150/90 | HR 71 | Ht 64.0 in | Wt 249.6 lb

## 2022-05-15 DIAGNOSIS — I8393 Asymptomatic varicose veins of bilateral lower extremities: Secondary | ICD-10-CM

## 2022-05-15 DIAGNOSIS — I10 Essential (primary) hypertension: Secondary | ICD-10-CM | POA: Diagnosis not present

## 2022-05-15 DIAGNOSIS — E782 Mixed hyperlipidemia: Secondary | ICD-10-CM

## 2022-05-15 DIAGNOSIS — R600 Localized edema: Secondary | ICD-10-CM | POA: Diagnosis not present

## 2022-05-15 MED ORDER — ATORVASTATIN CALCIUM 20 MG PO TABS: 20.0000 mg | ORAL_TABLET | Freq: Every day | ORAL | 3 refills | Status: DC

## 2022-05-15 MED ORDER — VALSARTAN 320 MG PO TABS: 320.0000 mg | ORAL_TABLET | Freq: Every day | ORAL | 3 refills | Status: DC

## 2022-05-15 MED ORDER — FUROSEMIDE 20 MG PO TABS: 20.0000 mg | ORAL_TABLET | Freq: Every day | ORAL | 0 refills | Status: DC | PRN

## 2022-05-15 NOTE — Progress Notes (Signed)
Subjective:    Patient ID: [name]., male    DOB: 16-Aug-1991, 31 y.o.   MRN: [id number]  Miguel Perez. is a 31 y.o. male presenting on 05/15/2022 for Leg Swelling  Patient presents for a same day appointment.  PCP Webb Silversmith, FNP   HPI  Bilateral Leg Swelling Hypertension Reports has had worse leg swelling for few months now. No significant pain with swelling. Admits some elevated BP still. Denies any redness or ulceration or injury or trauma. He has had chronic history of leg swelling and varicose veins before. Previous BP medication course on Valsartan 160 and then Thiazide was added but it caused him urinary frequency came off of it. And also on Amlodipine.     10/24/2021    2:41 PM 05/06/2020    3:55 PM 03/03/2020    3:31 PM  Depression screen PHQ 2/9  Decreased Interest 1 0 0  Down, Depressed, Hopeless 0 0 0  PHQ - 2 Score 1 0 0  Altered sleeping 1 0   Tired, decreased energy 1 0   Change in appetite 0 0   Feeling bad or failure about yourself  0 0   Trouble concentrating 0 0   Moving slowly or fidgety/restless 0 0   Suicidal thoughts 0 0   PHQ-9 Score 3 0   Difficult doing work/chores Not difficult at all Not difficult at all     Social History   Tobacco Use   Smoking status: Every Day    Packs/day: 1.00    Years: 11.00    Total pack years: 11.00    Types: Cigarettes   Smokeless tobacco: Never  Vaping Use   Vaping Use: Never used  Substance Use Topics   Alcohol use: Yes    Alcohol/week: 8.0 standard drinks of alcohol    Types: 8 Standard drinks or equivalent per week    Comment: 8 standard on a weekly basis    Drug use: No    Review of Systems Per HPI unless specifically indicated above     Objective:    BP (!) 150/90   Pulse 71   Ht _0  (1.626 m)   Wt 249 lb 9.6 oz (113.2 kg)   SpO2 97%   BMI 42.84 kg/m   Wt Readings from Last 3 Encounters:  05/15/22 249 lb 9.6 oz (113.2 kg)  11/11/21 235 lb (106.6 kg)  11/09/21 235 lb  12.8 oz (107 kg)    Physical Exam Vitals and nursing note reviewed.  Constitutional:      General: He is not in acute distress.    Appearance: Normal appearance. He is well-developed. He is obese. He is not diaphoretic.     Comments: Well-appearing, comfortable, cooperative  HENT:     Head: Normocephalic and atraumatic.  Eyes:     General:        Right eye: No discharge.        Left eye: No discharge.     Conjunctiva/sclera: Conjunctivae normal.  Cardiovascular:     Rate and Rhythm: Normal rate.  Pulmonary:     Effort: Pulmonary effort is normal.  Musculoskeletal:     Right lower leg: Edema (+1 non pitting edema bilateral, varicose veins present.) present.     Left lower leg: Edema present.  Skin:    General: Skin is warm and dry.     Findings: No erythema or rash.  Neurological:     Mental Status: He is alert and  oriented to person, place, and time.  Psychiatric:        Mood and Affect: Mood normal.        Behavior: Behavior normal.        Thought Content: Thought content normal.     Comments: Well groomed, good eye contact, normal speech and thoughts      Results for orders placed or performed in visit on 11/09/21  COMPLETE METABOLIC PANEL WITH GFR  Result Value Ref Range   Glucose, Bld 85 65 - 139 mg/dL   BUN 20 7 - 25 mg/dL   Creat 1.07 0.60 - 1.24 mg/dL   eGFR 96 > OR = 60 mL/min/1.1m   BUN/Creatinine Ratio NOT APPLICABLE 6 - 22 (calc)   Sodium 140 135 - 146 mmol/L   Potassium 4.1 3.5 - 5.3 mmol/L   Chloride 103 98 - 110 mmol/L   CO2 29 20 - 32 mmol/L   Calcium 9.4 8.6 - 10.3 mg/dL   Total Protein 7.3 6.1 - 8.1 g/dL   Albumin 4.6 3.6 - 5.1 g/dL   Globulin 2.7 1.9 - 3.7 g/dL (calc)   AG Ratio 1.7 1.0 - 2.5 (calc)   Total Bilirubin 0.8 0.2 - 1.2 mg/dL   Alkaline phosphatase (APISO) 55 36 - 130 U/L   AST 25 10 - 40 U/L   ALT 27 9 - 46 U/L  Lipid panel  Result Value Ref Range   Cholesterol 180 <200 mg/dL   HDL 48 > OR = 40 mg/dL   Triglycerides 243 (H)  <150 mg/dL   LDL Cholesterol (Calc) 95 mg/dL (calc)   Total CHOL/HDL Ratio 3.8 <5.0 (calc)   Non-HDL Cholesterol (Calc) 132 (H) <130 mg/dL (calc)      Assessment & Plan:   Problem List Items Addressed This Visit     Hypertension   Relevant Medications   valsartan (DIOVAN) 320 MG tablet   furosemide (LASIX) 20 MG tablet   Other Visit Diagnoses     Bilateral lower extremity edema    -  Primary   Relevant Medications   furosemide (LASIX) 20 MG tablet   Asymptomatic varicose veins of both lower extremities       Relevant Medications   valsartan (DIOVAN) 320 MG tablet   furosemide (LASIX) 20 MG tablet       Likely due to venous insufficiency, varicose veins, and med side effect Amlodipine.   Stop Amlodipine 123mdue to leg swelling. Discontinue due to side effect.  Restart Valsartan for blood pressure, now at higher dose 32071maily  No further of the combo pill previously was Valsartan - Hydrochlorothiazide. Remain OFF Hydrochlorothiazide  Adding a new fluid pill only as needed, Furosemide (lasix) 2m70mily only if need for swelling.  Use RICE therapy: - R - Rest / relative rest with activity modification avoid overuse of joint - I - Ice packs (make sure you use a towel or sock / something to protect skin) - C - Compression with socks / ACE wrap to apply pressure and reduce swelling allowing more support - E - Elevation - if significant swelling, lift leg above heart level (toes above your nose) to help reduce swelling, most helpful at night after day of being on your feet  Future may consider referral to Vascular if interested for vein evaluation  Meds ordered this encounter  Medications   valsartan (DIOVAN) 320 MG tablet    Sig: Take 1 tablet (320 mg total) by mouth daily.    Dispense:  90 tablet    Refill:  3   furosemide (LASIX) 20 MG tablet    Sig: Take 1 tablet (20 mg total) by mouth daily as needed for fluid or edema.    Dispense:  30 tablet    Refill:  0       Follow up plan: Return if symptoms worsen or fail to improve.   Nobie Putnam, [facility] 05/15/2022, 2:48 PM

## 2022-05-15 NOTE — Patient Instructions (Addendum)
Thank you for coming to the office today.  Stop Amlodipine 10mg  due to leg swelling  Restart Valsartan for blood pressure, now at higher dose 320mg  daily  No further of the combo pill previously was Valsartan - Hydrochlorothiazide.  Adding a new fluid pill only as needed, Furosemide (lasix) 20mg  daily only if need for swelling.  Use RICE therapy: - R - Rest / relative rest with activity modification avoid overuse of joint - I - Ice packs (make sure you use a towel or sock / something to protect skin) - C - Compression with socks / ACE wrap to apply pressure and reduce swelling allowing more support - E - Elevation - if significant swelling, lift leg above heart level (toes above your nose) to help reduce swelling, most helpful at night after day of being on your feet  Future may consider referral to Vascular if interested for vein evaluation   Please schedule a Follow-up Appointment to: Return if symptoms worsen or fail to improve.  If you have any other questions or concerns, please feel free to call the office or send a message through MyChart. You may also schedule an earlier appointment if necessary.  Additionally, you may be receiving a survey about your experience at our office within a few days to 1 week by e-mail or mail. We value your feedback.  , DO [facility], 

## 2022-06-07 ENCOUNTER — Ambulatory Visit: Payer: Self-pay | Admitting: *Deleted

## 2022-06-07 NOTE — Telephone Encounter (Signed)
  Chief Complaint: leg numb and painful Symptoms: numb 5 months, now painful last two weeks Frequency: with activity worse Pertinent Negatives: Patient denies fever, swelling, injury, rash, calf pain Disposition: [] ED /[] Urgent Care (no appt availability in office) / [x] Appointment(In office/virtual)/ []  [facility]/ [] Home Care/ [] Refused Recommended Disposition /[] [facility]/ []  Follow-up with PCP Additional Notes: Pt has appt Monday, on wait list for later in day appt, gets off work at 2 but will ask off and be there Monday for earlier appt if no later one becomes available.  Reason for Disposition  [1] MODERATE pain (e.g., interferes with normal activities, limping) AND [2] present > 3 days  Answer Assessment - Initial Assessment Questions 1. ONSET: "When did the pain start?"      Numbness 5 months, pain last two weeks 2. LOCATION: "Where is the pain located?"      Whole left leg from hip down 3. PAIN: "How bad is the pain?"    (Scale 1-10; or mild, moderate, severe)   -  MILD (1-3): doesn't interfere with normal activities    -  MODERATE (4-7): interferes with normal activities (e.g., work or school) or awakens from sleep, limping    -  SEVERE (8-10): excruciating pain, unable to do any normal activities, unable to walk     unbearable 4. WORK OR EXERCISE: "Has there been any recent work or exercise that involved this part of the body?"      Not recent 5. CAUSE: "What do you think is causing the leg pain?"     Unsure, hurt back a few years ago 6. OTHER SYMPTOMS: "Do you have any other symptoms?" (e.g., chest pain, back pain, breathing difficulty, swelling, rash, fever, numbness, weakness)     no 7. PREGNANCY: "Is there any chance you are pregnant?" "When was your last menstrual period?"     Na  Protocols used: Leg Pain-A-AH

## 2022-06-07 NOTE — Telephone Encounter (Signed)
FYI.... Pt has an apt with Dr. Kirtland Bouchard on 08/07.  Thank you,   -Vernona Rieger

## 2022-06-11 ENCOUNTER — Ambulatory Visit: Payer: BC Managed Care – PPO | Admitting: Family Medicine

## 2022-06-11 ENCOUNTER — Encounter: Payer: Self-pay | Admitting: Family Medicine

## 2022-06-11 VITALS — BP 148/86 | HR 64 | Ht 65.0 in | Wt 250.8 lb

## 2022-06-11 DIAGNOSIS — M5432 Sciatica, left side: Secondary | ICD-10-CM

## 2022-06-11 DIAGNOSIS — G5702 Lesion of sciatic nerve, left lower limb: Secondary | ICD-10-CM

## 2022-06-11 MED ORDER — METHOCARBAMOL 500 MG PO TABS: 500.0000 mg | ORAL_TABLET | Freq: Three times a day (TID) | ORAL | 3 refills | Status: DC | PRN

## 2022-06-11 MED ORDER — PREDNISONE 20 MG PO TABS: ORAL_TABLET | ORAL | 0 refills | Status: DC

## 2022-06-11 NOTE — Progress Notes (Signed)
Subjective:    Patient ID: Miguel Dancer., male    DOB: 09-02-1991, 31 y.o.   MRN: [id number]  Miguel Muscat. is a 31 y.o. male presenting on 06/11/2022 for leg numbless   HPI  Left Lower Extremity Numbness / Painful Paresthesia Initially 5 months ago started with numbness tingling in toes and then it was periodic or episodes and now it seems to be the whole leg. He admits episodes of pain and paresthesia Not endorsing back pain Prior history of thoracic symptoms and prior x-ray /MRI ordered but not done - Worse if active and on feet persistently - Occasional improvement with rest, ice and heat - Taking Ibuprofen 278m x 3 = 6050mPRN 1-2 times a day  Bilateral Leg Swelling Hypertension On Valsartan 32035maily      10/24/2021    2:41 PM 05/06/2020    3:55 PM 03/03/2020    3:31 PM  Depression screen PHQ 2/9  Decreased Interest 1 0 0  Down, Depressed, Hopeless 0 0 0  PHQ - 2 Score 1 0 0  Altered sleeping 1 0   Tired, decreased energy 1 0   Change in appetite 0 0   Feeling bad or failure about yourself  0 0   Trouble concentrating 0 0   Moving slowly or fidgety/restless 0 0   Suicidal thoughts 0 0   PHQ-9 Score 3 0   Difficult doing work/chores Not difficult at all Not difficult at all     Social History   Tobacco Use   Smoking status: Every Day    Packs/day: 1.00    Years: 11.00    Total pack years: 11.00    Types: Cigarettes   Smokeless tobacco: Never  Vaping Use   Vaping Use: Never used  Substance Use Topics   Alcohol use: Yes    Alcohol/week: 8.0 standard drinks of alcohol    Types: 8 Standard drinks or equivalent per week    Comment: 8 standard on a weekly basis    Drug use: No    Review of Systems Per HPI unless specifically indicated above     Objective:    BP (!) 148/86 (BP Location: Left Arm, Cuff Size: Normal)   Pulse 64   Ht _0  (1.651 m)   Wt 250 lb 12.8 oz (113.8 kg)   SpO2 98%   BMI 41.74 kg/m   Wt Readings from Last 3  Encounters:  06/11/22 250 lb 12.8 oz (113.8 kg)  05/15/22 249 lb 9.6 oz (113.2 kg)  11/11/21 235 lb (106.6 kg)    Physical Exam Vitals and nursing note reviewed.  Constitutional:      General: He is not in acute distress.    Appearance: Normal appearance. He is well-developed. He is not diaphoretic.     Comments: Well-appearing, comfortable, cooperative  HENT:     Head: Normocephalic and atraumatic.  Eyes:     General:        Right eye: No discharge.        Left eye: No discharge.     Conjunctiva/sclera: Conjunctivae normal.  Cardiovascular:     Rate and Rhythm: Normal rate.  Pulmonary:     Effort: Pulmonary effort is normal.  Musculoskeletal:     Comments: Straight leg raise positive for reproduced symptoms sciatica  Skin:    General: Skin is warm and dry.     Findings: No erythema or rash.  Neurological:     Mental Status: He  is alert and oriented to person, place, and time.  Psychiatric:        Mood and Affect: Mood normal.        Behavior: Behavior normal.        Thought Content: Thought content normal.     Comments: Well groomed, good eye contact, normal speech and thoughts    Results for orders placed or performed in visit on 11/09/21  COMPLETE METABOLIC PANEL WITH GFR  Result Value Ref Range   Glucose, Bld 85 65 - 139 mg/dL   BUN 20 7 - 25 mg/dL   Creat 1.07 0.60 - 1.24 mg/dL   eGFR 96 > OR = 60 mL/min/1.8m   BUN/Creatinine Ratio NOT APPLICABLE 6 - 22 (calc)   Sodium 140 135 - 146 mmol/L   Potassium 4.1 3.5 - 5.3 mmol/L   Chloride 103 98 - 110 mmol/L   CO2 29 20 - 32 mmol/L   Calcium 9.4 8.6 - 10.3 mg/dL   Total Protein 7.3 6.1 - 8.1 g/dL   Albumin 4.6 3.6 - 5.1 g/dL   Globulin 2.7 1.9 - 3.7 g/dL (calc)   AG Ratio 1.7 1.0 - 2.5 (calc)   Total Bilirubin 0.8 0.2 - 1.2 mg/dL   Alkaline phosphatase (APISO) 55 36 - 130 U/L   AST 25 10 - 40 U/L   ALT 27 9 - 46 U/L  Lipid panel  Result Value Ref Range   Cholesterol 180 <200 mg/dL   HDL 48 > OR = 40 mg/dL    Triglycerides 243 (H) <150 mg/dL   LDL Cholesterol (Calc) 95 mg/dL (calc)   Total CHOL/HDL Ratio 3.8 <5.0 (calc)   Non-HDL Cholesterol (Calc) 132 (H) <130 mg/dL (calc)      Assessment & Plan:   Problem List Items Addressed This Visit   None Visit Diagnoses     Left sided sciatica    -  Primary   Relevant Medications   methocarbamol (ROBAXIN) 500 MG tablet   predniSONE (DELTASONE) 20 MG tablet   Piriformis syndrome of left side       Relevant Medications   methocarbamol (ROBAXIN) 500 MG tablet   predniSONE (DELTASONE) 20 MG tablet       Acute L sided gluteal vs piriformis syndrome with acute L sciatica. Suspected due to piriformis syndrome localized on exam. No inciting injury, but likely provoked activities -No red flag symptoms. Negative SLR for radiculopathy -Inadequate conservative therapy  Mild improvement but no significant relief from conservative therapy  Plan: 1. Order Prednisone taper 2. Order Methocarbamol PRN inc frequency 3. May use Tylenol PRN for breakthrough 4. Encouraged use of heating pad 1-2x daily for now then PRN 5. Avoid prolonged sitting, may take breaks if driving, given handout piriformis stretches for future 6. Follow-up within 2 weeks if not improved or worsening, return criteria given, future may consider Gabapentin trial if lingering sciatica pain after acute flare    Meds ordered this encounter  Medications   methocarbamol (ROBAXIN) 500 MG tablet    Sig: Take 1 tablet (500 mg total) by mouth every 8 (eight) hours as needed for muscle spasms.    Dispense:  90 tablet    Refill:  3   predniSONE (DELTASONE) 20 MG tablet    Sig: Take daily with food. Start with 685m(3 pills) x 2 days, then reduce to 4033m2 pills) x 2 days, then 60m21m pill) x 3 days    Dispense:  13 tablet    Refill:  0      Follow up plan: Return in about 4 weeks (around 07/09/2022), or if symptoms worsen or fail to improve, for w PCP if not improved  sciatica.   Nobie Putnam, [facility] 06/11/2022, 2:05 PM

## 2022-06-11 NOTE — Patient Instructions (Addendum)
Thank you for coming to the office today.  For your Back/Hip Pain - this is most likely due to Muscle Spasms in your Piriformis Muscle (deeper in by hip), causing Sciatica (irritation of your sciatic nerve can cause shooting pain down your legs, sometimes with numbness and tingling).  - Start Prednisone taper (steroid anti-inflammatory) for nerve irritation with pain in legs.   7 day taper Do not take any Ibuprofen or Aleve while taking the Prednisone. - Once finished Prednisone, then start with other anti-inflammatory such as Ibuprofen 600mg  (take 3 of the 200mg  pills) per dose with food every 6 to 8 hours or 3 times a day, take it every day for at least 2 to 4 weeks then only as needed  -Start muscle relaxant Robaxin Methocarbamol 500mg  2-3 times a day   Also safe to add on Tylenol Extra Strength 500mg  tabs - take 1 to 2 tabs (max 1000mg  per dose) every 6 hours for pain (take regularly, don't skip a dose for next 3-7 days), max 24 hour daily dose is 6 to 8 tablets or 3000 to 4000mg   Recommend to start using heating pad on your lower back 1-2x daily for few weeks  Be careful with prolonged sitting (especially if you sit on a wallet or other object this can cause flare of pain), if driving long car trip make sure to stop and take a good break to stretch legs, may find a cushion that works to ease stress of your buttocks and hip muscles.  Also try a Wedge Seat Cushion to avoid nerve pinching when sitting prolonged period of time.  This pain may take weeks to months to fully resolve, but hopefully it will respond to the medicine initially. All back injuries (small or serious) are slow to heal since we use our back muscles every day. Be careful with turning, twisting, lifting, sitting / standing for prolonged periods, and avoid re-injury.  If your symptoms significantly worsen with more pain, or new symptoms with weakness in one or both legs, new or different shooting leg pains, numbness in legs  or groin, loss of control or retention of urine or bowel movements, please call back for advice and you may need to go directly to the Emergency Department.  Please schedule a Follow-up Appointment to: Return in about 4 weeks (around 07/09/2022), or if symptoms worsen or fail to improve, for w PCP if not improved sciatica.  If you have any other questions or concerns, please feel free to call the office or send a message through MyChart. You may also schedule an earlier appointment if necessary.  Additionally, you may be receiving a survey about your experience at our office within a few days to 1 week by e-mail or mail. We value your feedback.  , DO [facility], [facility]     Piriformis Syndrome Rehabilitation Exercises   You may do all of these exercises right away once acute pain starts to improve on medication or treatment.  Piriformis stretch: Lying on your back with both knees bent, rest the ankle of your injured leg over the knee of your uninjured leg. Grasp the thigh of your uninjured leg and pull that knee toward your chest. You will feel a stretch along the buttocks and possibly along the outside of your hip on the injured side. Hold this for 15 to 30 seconds. Repeat 3 times.  Standing hamstring stretch: Place the heel of your leg on a stool about 15 inches high. Keep  your knee straight. Lean forward, bending at the hips until you feel a mild stretch in the back of your thigh. Make sure you do not roll your shoulders and bend at the waist when doing this or you will stretch your lower back instead. Hold the stretch for 15 to 30 seconds. Repeat 3 times.  Pelvic tilt: Lie on your back with your knees bent and your feet flat on the floor. Tighten your abdominal muscles and push your lower back into the floor. Hold this position for 5 seconds, then relax. Do 3 sets of 10.  Partial curl: Lie on your back with your knees bent and your feet flat on the floor.  Tighten your stomach muscles and flatten your back against the floor. Tuck your chin to your chest. With your hands stretched out in front of you, curl your upper body forward until your shoulders clear the floor. Hold this position for 3 seconds. Don't hold your breath. It helps to breathe out as you lift your shoulders up. Relax. Repeat 10 times. Build to 3 sets of 10. To challenge yourself, clasp your hands behind your head and keep your elbows out to the side.  Prone hip extension: Lie on your stomach with your legs straight out behind you. Tighten up your buttocks muscles and lift one leg off the floor about 8 inches. Keep your knee straight. Hold for 5 seconds. Then lower your leg and relax. Do 3 sets of 10.  If both legs are affected, you may repeat this exercise for the other leg.

## 2022-11-01 ENCOUNTER — Encounter: Payer: Self-pay | Admitting: Internal Medicine

## 2022-11-01 ENCOUNTER — Ambulatory Visit: Payer: BC Managed Care – PPO | Admitting: Internal Medicine

## 2022-11-01 VITALS — BP 148/94 | HR 57 | Temp 98.0°F | Resp 16 | Ht 64.0 in | Wt 257.2 lb

## 2022-11-01 DIAGNOSIS — Z0001 Encounter for general adult medical examination with abnormal findings: Secondary | ICD-10-CM | POA: Diagnosis not present

## 2022-11-01 DIAGNOSIS — R7303 Prediabetes: Secondary | ICD-10-CM

## 2022-11-01 DIAGNOSIS — Z6841 Body Mass Index (BMI) 40.0 and over, adult: Secondary | ICD-10-CM

## 2022-11-01 DIAGNOSIS — E782 Mixed hyperlipidemia: Secondary | ICD-10-CM | POA: Diagnosis not present

## 2022-11-01 DIAGNOSIS — I10 Essential (primary) hypertension: Secondary | ICD-10-CM

## 2022-11-01 MED ORDER — AMLODIPINE-VALSARTAN-HCTZ 10-320-25 MG PO TABS: 1.0000 | ORAL_TABLET | Freq: Every day | ORAL | 0 refills | Status: DC

## 2022-11-01 NOTE — Progress Notes (Signed)
Subjective:    Patient ID: Miguel Perez., male    DOB: [birth year], 31 y.o.   MRN: [id number]  HPI  Patient presents to clinic today for his annual exam. Of note, his BP today is 155/94. He is taking Valsartan as prescribed.   Flu: 10/2021 Tetanus: 12/2018 COVID: never Dentist: as needed  Diet: He does eat meat. He rarely consumes fruits and veggies. He does eat some fried foods. He drinks mostly soda, water Exercise: None  Review of Systems     Past Medical History:  Diagnosis Date   Asthma     Current Outpatient Medications  Medication Sig Dispense Refill   albuterol (VENTOLIN HFA) 108 (90 Base) MCG/ACT inhaler INHALE 1 TO 2 PUFFS BY MOUTH EVERY 6 HOURS AS NEEDED FOR WHEEZING FOR SHORTNESS OF BREATH 18 g 6   atorvastatin (LIPITOR) 20 MG tablet Take 1 tablet (20 mg total) by mouth daily. 90 tablet 3   fluticasone-salmeterol (ADVAIR) 100-50 MCG/ACT AEPB Inhale 1 puff into the lungs 2 (two) times daily. 180 each 3   furosemide (LASIX) 20 MG tablet Take 1 tablet (20 mg total) by mouth daily as needed for fluid or edema. 30 tablet 0   methocarbamol (ROBAXIN) 500 MG tablet Take 1 tablet (500 mg total) by mouth every 8 (eight) hours as needed for muscle spasms. 90 tablet 3   montelukast (SINGULAIR) 10 MG tablet TAKE 1 TABLET BY MOUTH AT BEDTIME 30 tablet 2   predniSONE (DELTASONE) 20 MG tablet Take daily with food. Start with 24m (3 pills) x 2 days, then reduce to 456m(2 pills) x 2 days, then 2036m1 pill) x 3 days 13 tablet 0   valsartan (DIOVAN) 320 MG tablet Take 1 tablet (320 mg total) by mouth daily. 90 tablet 3   No current facility-administered medications for this visit.    No Known Allergies  Family History  Problem Relation Age of Onset   Hypertension Mother     Social History   Socioeconomic History   Marital status: Single    Spouse name: Not on file   Number of children: Not on file   Years of education: Not on file   Highest education level: Not on  file  Occupational History   Not on file  Tobacco Use   Smoking status: Every Day    Packs/day: 1.00    Years: 11.00    Total pack years: 11.00    Types: Cigarettes   Smokeless tobacco: Never  Vaping Use   Vaping Use: Never used  Substance and Sexual Activity   Alcohol use: Yes    Alcohol/week: 8.0 standard drinks of alcohol    Types: 8 Standard drinks or equivalent per week    Comment: 8 standard on a weekly basis    Drug use: No   Sexual activity: Yes  Other Topics Concern   Not on file  Social History Narrative   Not on file   Social Determinants of Health   Financial Resource Strain: Not on file  Food Insecurity: Not on file  Transportation Needs: Not on file  Physical Activity: Not on file  Stress: Not on file  Social Connections: Not on file  Intimate Partner Violence: Not on file     Constitutional: Denies fever, malaise, fatigue, headache or abrupt weight changes.  HEENT: Denies eye pain, eye redness, ear pain, ringing in the ears, wax buildup, runny nose, nasal congestion, bloody nose, or sore throat. Respiratory: Denies difficulty breathing, shortness  of breath, cough or sputum production.   Cardiovascular: Denies chest pain, chest tightness, palpitations or swelling in the hands or feet.  Gastrointestinal: Denies abdominal pain, bloating, constipation, diarrhea or blood in the stool.  GU: Denies urgency, frequency, pain with urination, burning sensation, blood in urine, odor or discharge. Musculoskeletal: Patient reports chronic back pain.  Denies decrease in range of motion, difficulty with gait, or joint swelling.  Skin: Denies redness, rashes, lesions or ulcercations.  Neurological: Denies dizziness, difficulty with memory, difficulty with speech or problems with balance and coordination.  Psych: Denies anxiety, depression, SI/HI.  No other specific complaints in a complete review of systems (except as listed in HPI above).  Objective:   Physical  Exam   BP (!) 155/94 (BP Location: Right Arm, Patient Position: Sitting, Cuff Size: Large)   Pulse (!) 57   Temp 98 F (36.7 C) (Temporal)   Resp 16   Ht _0  (1.626 m)   Wt 257 lb 3.2 oz (116.7 kg)   SpO2 98%   BMI 44.15 kg/m   Wt Readings from Last 3 Encounters:  06/11/22 250 lb 12.8 oz (113.8 kg)  05/15/22 249 lb 9.6 oz (113.2 kg)  11/11/21 235 lb (106.6 kg)    General: Appears his stated age, obese, in NAD. Skin: Warm, dry and intact.  HEENT: Head: normal shape and size; Eyes: sclera white, no icterus, conjunctiva pink, PERRLA and EOMs intact;  Neck:  Neck supple, trachea midline. No masses, lumps or thyromegaly present.  Cardiovascular: Normal rate and rhythm. S1,S2 noted.  No murmur, rubs or gallops noted. No JVD.  Trace pitting BLE edema.  Pulmonary/Chest: Normal effort and positive vesicular breath sounds. No respiratory distress. No wheezes, rales or ronchi noted.  Abdomen: Normal bowel sounds.  Musculoskeletal: Strength 5/5 BUE/BLE.  No difficulty with gait.  Neurological: Alert and oriented. Cranial nerves II-XII grossly intact. Coordination normal.  Psychiatric: Mood and affect normal. Behavior is normal. Judgment and thought content normal.   BMET    Component Value Date/Time   NA 140 02/07/2022 1518   K 4.1 02/07/2022 1518   CL 103 02/07/2022 1518   CO2 29 02/07/2022 1518   GLUCOSE 85 02/07/2022 1518   BUN 20 02/07/2022 1518   CREATININE 1.07 02/07/2022 1518   CALCIUM 9.4 02/07/2022 1518   GFRNONAA >60 07/01/2020 1104   GFRNONAA 100 05/23/2020 1533   GFRAA >60 07/01/2020 1104   GFRAA 115 05/23/2020 1533    Lipid Panel     Component Value Date/Time   CHOL 180 02/07/2022 1518   TRIG 243 (H) 02/07/2022 1518   HDL 48 02/07/2022 1518   CHOLHDL 3.8 02/07/2022 1518   LDLCALC 95 02/07/2022 1518    CBC    Component Value Date/Time   WBC 5.9 10/24/2021 1501   RBC 5.09 10/24/2021 1501   HGB 14.4 10/24/2021 1501   HCT 42.3 10/24/2021 1501   PLT 200  10/24/2021 1501   MCV 83.1 10/24/2021 1501   MCH 28.3 10/24/2021 1501   MCHC 34.0 10/24/2021 1501   RDW 13.2 10/24/2021 1501   LYMPHSABS 1.5 07/01/2020 1104   MONOABS 0.5 07/01/2020 1104   EOSABS 0.2 07/01/2020 1104   BASOSABS 0.0 07/01/2020 1104    Hgb A1C Lab Results  Component Value Date   HGBA1C 5.6 10/24/2021           Assessment & Plan:   Preventative Health Maintenance:  He declines flu shot today Tetanus UTD Encouraged him to get his COVID-vaccine  Encouraged him to consume a balanced diet and exercise regimen Advised him to see an eye doctor and dentist annually We will check CBC, c-Met, lipid and A1c today  RTC in 2 weeks for follow-up HTN/labs, 6 months, follow-up chronic conditions Webb Silversmith, NP

## 2022-11-01 NOTE — Patient Instructions (Signed)
Health Maintenance, Male Adopting a healthy lifestyle and getting preventive care are important in promoting health and wellness. Ask your health care provider about: The right schedule for you to have regular tests and exams. Things you can do on your own to prevent diseases and keep yourself healthy. What should I know about diet, weight, and exercise? Eat a healthy diet  Eat a diet that includes plenty of vegetables, fruits, low-fat dairy products, and lean protein. Do not eat a lot of foods that are high in solid fats, added sugars, or sodium. Maintain a healthy weight Body mass index (BMI) is a measurement that can be used to identify possible weight problems. It estimates body fat based on height and weight. Your health care provider can help determine your BMI and help you achieve or maintain a healthy weight. Get regular exercise Get regular exercise. This is one of the most important things you can do for your health. Most adults should: Exercise for at least 150 minutes each week. The exercise should increase your heart rate and make you sweat (moderate-intensity exercise). Do strengthening exercises at least twice a week. This is in addition to the moderate-intensity exercise. Spend less time sitting. Even light physical activity can be beneficial. Watch cholesterol and blood lipids Have your blood tested for lipids and cholesterol at 30 years of age, then have this test every 5 years. You may need to have your cholesterol levels checked more often if: Your lipid or cholesterol levels are high. You are older than 30 years of age. You are at high risk for heart disease. What should I know about cancer screening? Many types of cancers can be detected early and may often be prevented. Depending on your health history and family history, you may need to have cancer screening at various ages. This may include screening for: Colorectal cancer. Prostate cancer. Skin cancer. Lung  cancer. What should I know about heart disease, diabetes, and high blood pressure? Blood pressure and heart disease High blood pressure causes heart disease and increases the risk of stroke. This is more likely to develop in people who have high blood pressure readings or are overweight. Talk with your health care provider about your target blood pressure readings. Have your blood pressure checked: Every 3-5 years if you are 18-39 years of age. Every year if you are 40 years old or older. If you are between the ages of 65 and 75 and are a current or former smoker, ask your health care provider if you should have a one-time screening for abdominal aortic aneurysm (AAA). Diabetes Have regular diabetes screenings. This checks your fasting blood sugar level. Have the screening done: Once every three years after age 45 if you are at a normal weight and have a low risk for diabetes. More often and at a younger age if you are overweight or have a high risk for diabetes. What should I know about preventing infection? Hepatitis B If you have a higher risk for hepatitis B, you should be screened for this virus. Talk with your health care provider to find out if you are at risk for hepatitis B infection. Hepatitis C Blood testing is recommended for: Everyone born from 1945 through 1965. Anyone with known risk factors for hepatitis C. Sexually transmitted infections (STIs) You should be screened each year for STIs, including gonorrhea and chlamydia, if: You are sexually active and are younger than 30 years of age. You are older than 30 years of age and your   health care provider tells you that you are at risk for this type of infection. Your sexual activity has changed since you were last screened, and you are at increased risk for chlamydia or gonorrhea. Ask your health care provider if you are at risk. Ask your health care provider about whether you are at high risk for HIV. Your health care provider  may recommend a prescription medicine to help prevent HIV infection. If you choose to take medicine to prevent HIV, you should first get tested for HIV. You should then be tested every 3 months for as long as you are taking the medicine. Follow these instructions at home: Alcohol use Do not drink alcohol if your health care provider tells you not to drink. If you drink alcohol: Limit how much you have to 0-2 drinks a day. Know how much alcohol is in your drink. In the [place] one drink equals one 12 oz bottle of beer (355 mL), one 5 oz glass of wine (148 mL), or one 1 oz glass of hard liquor (44 mL). Lifestyle Do not use any products that contain nicotine or tobacco. These products include cigarettes, chewing tobacco, and vaping devices, such as e-cigarettes. If you need help quitting, ask your health care provider. Do not use street drugs. Do not share needles. Ask your health care provider for help if you need support or information about quitting drugs. General instructions Schedule regular health, dental, and eye exams. Stay current with your vaccines. Tell your health care provider if: You often feel depressed. You have ever been abused or do not feel safe at home. Summary Adopting a healthy lifestyle and getting preventive care are important in promoting health and wellness. Follow your health care provider's instructions about healthy diet, exercising, and getting tested or screened for diseases. Follow your health care provider's instructions on monitoring your cholesterol and blood pressure. This information is not intended to replace advice given to you by your health care provider. Make sure you discuss any questions you have with your health care provider. Document Revised: 03/13/2021 Document Reviewed: 03/13/2021 Elsevier Patient Education  2023 [employer]  

## 2022-11-01 NOTE — Assessment & Plan Note (Signed)
Encourage diet and exercise for weight loss 

## 2022-11-01 NOTE — Assessment & Plan Note (Signed)
Uncontrolled on valsartan, will discontinue Rx for amlodipine-valsartan-HCTZ Reinforced DASH diet and exercise for weight loss

## 2022-11-14 ENCOUNTER — Other Ambulatory Visit: Payer: Self-pay

## 2022-11-14 DIAGNOSIS — Z0001 Encounter for general adult medical examination with abnormal findings: Secondary | ICD-10-CM

## 2022-11-14 DIAGNOSIS — E782 Mixed hyperlipidemia: Secondary | ICD-10-CM

## 2022-11-14 DIAGNOSIS — R7303 Prediabetes: Secondary | ICD-10-CM

## 2022-11-15 ENCOUNTER — Ambulatory Visit: Payer: BC Managed Care – PPO

## 2022-11-15 ENCOUNTER — Other Ambulatory Visit: Payer: BC Managed Care – PPO

## 2022-11-15 DIAGNOSIS — R7303 Prediabetes: Secondary | ICD-10-CM | POA: Diagnosis not present

## 2022-11-15 DIAGNOSIS — Z0001 Encounter for general adult medical examination with abnormal findings: Secondary | ICD-10-CM | POA: Diagnosis not present

## 2022-11-15 DIAGNOSIS — E782 Mixed hyperlipidemia: Secondary | ICD-10-CM | POA: Diagnosis not present

## 2022-11-15 NOTE — Progress Notes (Signed)
I would like to obtain renal artery ultrasound to rule out renal artery stenosis given his persistent hypertension. Let me know if he is agreeable and I will order.

## 2022-11-15 NOTE — Progress Notes (Signed)
Hypertension, follow-up  BP Readings from Last 3 Encounters:  11/01/22 (!) 148/94  06/11/22 (!) 148/86  05/15/22 (!) 150/90   Wt Readings from Last 3 Encounters:  11/01/22 257 lb 3.2 oz (116.7 kg)  06/11/22 250 lb 12.8 oz (113.8 kg)  05/15/22 249 lb 9.6 oz (113.2 kg)     He was last seen for hypertension  11/01/2022. BP at that visit was 148/94. Management since that visit includes starting amlodipine-valsartan-HCTZ 10/320/25mg  daily.  He reports excellent compliance with treatment. He is not having side effects.    Contact Number: [phone]

## 2022-11-16 LAB — COMPLETE METABOLIC PANEL WITH GFR
AG Ratio: 1.8 (calc) (ref 1.0–2.5)
ALT: 31 U/L (ref 9–46)
AST: 23 U/L (ref 10–40)
Albumin: 4.4 g/dL (ref 3.6–5.1)
Alkaline phosphatase (APISO): 69 U/L (ref 36–130)
BUN: 17 mg/dL (ref 7–25)
CO2: 28 mmol/L (ref 20–32)
Calcium: 9.5 mg/dL (ref 8.6–10.3)
Chloride: 99 mmol/L (ref 98–110)
Creat: 0.97 mg/dL (ref 0.60–1.26)
Globulin: 2.5 g/dL (calc) (ref 1.9–3.7)
Glucose, Bld: 101 mg/dL — ABNORMAL HIGH (ref 65–99)
Potassium: 4.1 mmol/L (ref 3.5–5.3)
Sodium: 140 mmol/L (ref 135–146)
Total Bilirubin: 0.6 mg/dL (ref 0.2–1.2)
Total Protein: 6.9 g/dL (ref 6.1–8.1)
eGFR: 108 mL/min/{1.73_m2} (ref >=60)

## 2022-11-16 LAB — CBC
HCT: 45 % (ref 38.5–50.0)
Hemoglobin: 15.3 g/dL (ref 13.2–17.1)
MCH: 28.6 pg (ref 27.0–33.0)
MCHC: 34 g/dL (ref 32.0–36.0)
MCV: 84.1 fL (ref 80.0–100.0)
MPV: 12 fL (ref 7.5–12.5)
Platelets: 197 10*3/uL (ref 140–400)
RBC: 5.35 10*6/uL (ref 4.20–5.80)
RDW: 13 % (ref 11.0–15.0)
WBC: 6.7 10*3/uL (ref 3.8–10.8)

## 2022-11-16 LAB — LIPID PANEL
Cholesterol: 191 mg/dL (ref <200)
HDL: 48 mg/dL (ref >=40)
LDL Cholesterol (Calc): 103 mg/dL (calc) — ABNORMAL HIGH
Non-HDL Cholesterol (Calc): 143 mg/dL (calc) — ABNORMAL HIGH (ref <130)
Total CHOL/HDL Ratio: 4 (calc) (ref <5.0)
Triglycerides: 280 mg/dL — ABNORMAL HIGH (ref <150)

## 2022-11-16 LAB — HEMOGLOBIN A1C
Hgb A1c MFr Bld: 6.5 % of total Hgb — ABNORMAL HIGH (ref <5.7)
Mean Plasma Glucose: 140 mg/dL
eAG (mmol/L): 7.7 mmol/L

## 2022-11-26 ENCOUNTER — Ambulatory Visit: Payer: BC Managed Care – PPO | Admitting: Internal Medicine

## 2022-11-26 ENCOUNTER — Encounter: Payer: Self-pay | Admitting: Internal Medicine

## 2022-11-26 VITALS — BP 144/96 | HR 65 | Temp 96.8°F | Wt 252.0 lb

## 2022-11-26 DIAGNOSIS — I10 Essential (primary) hypertension: Secondary | ICD-10-CM

## 2022-11-26 DIAGNOSIS — Z6841 Body Mass Index (BMI) 40.0 and over, adult: Secondary | ICD-10-CM

## 2022-11-26 DIAGNOSIS — E782 Mixed hyperlipidemia: Secondary | ICD-10-CM

## 2022-11-26 DIAGNOSIS — E119 Type 2 diabetes mellitus without complications: Secondary | ICD-10-CM | POA: Diagnosis not present

## 2022-11-26 MED ORDER — LABETALOL HCL 100 MG PO TABS: 100.0000 mg | ORAL_TABLET | Freq: Two times a day (BID) | ORAL | 0 refills | Status: DC

## 2022-11-26 NOTE — Progress Notes (Signed)
Subjective:    Patient ID: Miguel Perez., male    DOB: 1991/08/02, 32 y.o.   MRN: [id number]  HPI  Patient presents to the clinic today for follow-up of recent abnormal labs.  His recent LDL was 103, triglycerides 280 and A1c of 6.5.  He is not currently taking any cholesterol-lowering medication or oral diabetic medication at this time.  Of note, his BP today is 138/94. He is taking Amlodipine-Valsartan HCT as prescribed.   Review of Systems     Past Medical History:  Diagnosis Date   Asthma     Current Outpatient Medications  Medication Sig Dispense Refill   albuterol (VENTOLIN HFA) 108 (90 Base) MCG/ACT inhaler INHALE 1 TO 2 PUFFS BY MOUTH EVERY 6 HOURS AS NEEDED FOR WHEEZING FOR SHORTNESS OF BREATH 18 g 6   amLODIPine-Valsartan-HCTZ 10-320-25 MG TABS Take 1 tablet by mouth daily. 30 tablet 0   atorvastatin (LIPITOR) 20 MG tablet Take 1 tablet (20 mg total) by mouth daily. 90 tablet 3   fluticasone-salmeterol (ADVAIR) 100-50 MCG/ACT AEPB Inhale 1 puff into the lungs 2 (two) times daily. 180 each 3   methocarbamol (ROBAXIN) 500 MG tablet Take 1 tablet (500 mg total) by mouth every 8 (eight) hours as needed for muscle spasms. 90 tablet 3   montelukast (SINGULAIR) 10 MG tablet TAKE 1 TABLET BY MOUTH AT BEDTIME 30 tablet 2   No current facility-administered medications for this visit.    No Known Allergies  Family History  Problem Relation Age of Onset   Hypertension Mother     Social History   Socioeconomic History   Marital status: Single    Spouse name: Not on file   Number of children: Not on file   Years of education: Not on file   Highest education level: Not on file  Occupational History   Not on file  Tobacco Use   Smoking status: Every Day    Packs/day: 1.00    Years: 11.00    Total pack years: 11.00    Types: Cigarettes   Smokeless tobacco: Never  Vaping Use   Vaping Use: Never used  Substance and Sexual Activity   Alcohol use: Yes     Alcohol/week: 8.0 standard drinks of alcohol    Types: 8 Standard drinks or equivalent per week    Comment: 8 standard on a weekly basis    Drug use: No   Sexual activity: Yes  Other Topics Concern   Not on file  Social History Narrative   Not on file   Social Determinants of Health   Financial Resource Strain: Not on file  Food Insecurity: Not on file  Transportation Needs: Not on file  Physical Activity: Not on file  Stress: Not on file  Social Connections: Not on file  Intimate Partner Violence: Not on file     Constitutional: Denies fever, malaise, fatigue, headache or abrupt weight changes.  HEENT: Denies eye pain, eye redness, ear pain, ringing in the ears, wax buildup, runny nose, nasal congestion, bloody nose, or sore throat. Respiratory: Denies difficulty breathing, shortness of breath, cough or sputum production.   Cardiovascular: Denies chest pain, chest tightness, palpitations or swelling in the hands or feet.  Gastrointestinal: Denies abdominal pain, bloating, constipation, diarrhea or blood in the stool.  GU: Denies urgency, frequency, pain with urination, burning sensation, blood in urine, odor or discharge. Skin: Denies redness, rashes, lesions or ulcercations.  Neurological: Denies dizziness, difficulty with memory, difficulty with speech or  problems with balance and coordination.    No other specific complaints in a complete review of systems (except as listed in HPI above).  Objective:   Physical Exam  BP (!) 144/96 (BP Location: Right Arm, Patient Position: Sitting, Cuff Size: Large)   Pulse 65   Temp (!) 96.8 F (36 C) (Temporal)   Wt 252 lb (114.3 kg)   SpO2 97%   BMI 43.26 kg/m   Wt Readings from Last 3 Encounters:  11/01/22 257 lb 3.2 oz (116.7 kg)  06/11/22 250 lb 12.8 oz (113.8 kg)  05/15/22 249 lb 9.6 oz (113.2 kg)    General: Appears his stated age, obese, in NAD. Skin: Warm, dry and intact. No ulcerations noted. HEENT: Head: normal  shape and size; Eyes: sclera white, no icterus, conjunctiva pink, PERRLA and EOMs intact;  ardiovascular: Normal rate and rhythm. S1,S2 noted.  No murmur, rubs or gallops noted. No JVD or BLE edema.  Pulmonary/Chest: Normal effort and positive vesicular breath sounds. No respiratory distress. No wheezes, rales or ronchi noted.  Neurological: Alert and oriented.    BMET    Component Value Date/Time   NA 140 11/15/2022 0812   K 4.1 11/15/2022 0812   CL 99 11/15/2022 0812   CO2 28 11/15/2022 0812   GLUCOSE 101 (H) 11/15/2022 0812   BUN 17 11/15/2022 0812   CREATININE 0.97 11/15/2022 0812   CALCIUM 9.5 11/15/2022 0812   GFRNONAA >60 07/01/2020 1104   GFRNONAA 100 05/23/2020 1533   GFRAA >60 07/01/2020 1104   GFRAA 115 05/23/2020 1533    Lipid Panel     Component Value Date/Time   CHOL 191 11/15/2022 0812   TRIG 280 (H) 11/15/2022 0812   HDL 48 11/15/2022 0812   CHOLHDL 4.0 11/15/2022 0812   LDLCALC 103 (H) 11/15/2022 0812    CBC    Component Value Date/Time   WBC 6.7 11/15/2022 0812   RBC 5.35 11/15/2022 0812   HGB 15.3 11/15/2022 0812   HCT 45.0 11/15/2022 0812   PLT 197 11/15/2022 0812   MCV 84.1 11/15/2022 0812   MCH 28.6 11/15/2022 0812   MCHC 34.0 11/15/2022 0812   RDW 13.0 11/15/2022 0812   LYMPHSABS 1.5 07/01/2020 1104   MONOABS 0.5 07/01/2020 1104   EOSABS 0.2 07/01/2020 1104   BASOSABS 0.0 07/01/2020 1104    Hgb A1C Lab Results  Component Value Date   HGBA1C 6.5 (H) 11/15/2022           Assessment & Plan:    RTC in 3 months for follow-up of chronic conditions Webb Silversmith, NP

## 2022-11-26 NOTE — Assessment & Plan Note (Signed)
Encourage low-fat diet and exercise for weight loss °

## 2022-11-26 NOTE — Assessment & Plan Note (Signed)
New onset Discussed diabetes and standards of medical care Referral to diabetes educator and nutrition Encourage low-carb diet and exercise for weight loss Will hold off on oral diabetic medication at this time Encourage routine eye exam Encouraged routine foot exam

## 2022-11-26 NOTE — Assessment & Plan Note (Signed)
Uncontrolled on valsartan, amlodipine and HCTZ Will add labetalol 100 mg twice daily Encourage diet and exercise for weight loss

## 2022-11-26 NOTE — Assessment & Plan Note (Signed)
Encourage diet and exercise for weight loss 

## 2022-11-26 NOTE — Patient Instructions (Signed)

## 2022-12-02 ENCOUNTER — Other Ambulatory Visit: Payer: Self-pay | Admitting: Internal Medicine

## 2022-12-03 NOTE — Telephone Encounter (Signed)
Requested Prescriptions  Pending Prescriptions Disp Refills   amLODIPine-Valsartan-HCTZ 10-320-25 MG TABS [Pharmacy Med Name: amLODIPine-Valsartan-HCTZ 10-320-25 MG Oral Tablet] 90 tablet 0    Sig: Take 1 tablet by mouth once daily     Cardiovascular: CCB + ARB + Diuretic Combos Failed - 12/02/2022 11:27 AM      Failed - Last BP in normal range    BP Readings from Last 1 Encounters:  11/26/22 (!) 144/96         Passed - K in normal range and within 180 days    Potassium  Date Value Ref Range Status  11/15/2022 4.1 3.5 - 5.3 mmol/L Final         Passed - Na in normal range and within 180 days    Sodium  Date Value Ref Range Status  11/15/2022 140 135 - 146 mmol/L Final         Passed - Cr in normal range and within 180 days    Creat  Date Value Ref Range Status  11/15/2022 0.97 0.60 - 1.26 mg/dL Final         Passed - eGFR is 10 or above and within 180 days    GFR, Est African American  Date Value Ref Range Status  05/23/2020 115 > OR = 60 mL/min/1.26m2 Final   GFR calc Af Amer  Date Value Ref Range Status  07/01/2020 >60 >60 mL/min Final   GFR, Est Non African American  Date Value Ref Range Status  05/23/2020 100 > OR = 60 mL/min/1.48m2 Final   GFR calc non Af Amer  Date Value Ref Range Status  07/01/2020 >60 >60 mL/min Final   eGFR  Date Value Ref Range Status  11/15/2022 108 > OR = 60 mL/min/1.67m2 Final         Passed - Patient is not pregnant      Passed - Last Heart Rate in normal range    Pulse Readings from Last 1 Encounters:  11/26/22 65         Passed - Valid encounter within last 6 months    Recent Outpatient Visits           1 week ago Type 2 diabetes mellitus without complication, without long-term current use of insulin ([place])   [facility] [place], Coralie Keens, NP   1 month ago Encounter for general adult medical examination with abnormal findings   [facility] [place], Coralie Keens, NP   5  months ago Left sided sciatica   [place], DO   6 months ago Bilateral lower extremity edema   [place], DO   1 year ago Mixed hyperlipidemia   [place], Coralie Keens, NP       Future Appointments             In 3 months [place], Coralie Keens, NP [facility], [place]   In 5 months [place], Coralie Keens, NP [facility], [facility]

## 2022-12-20 ENCOUNTER — Ambulatory Visit: Payer: BC Managed Care – PPO | Admitting: *Deleted

## 2023-03-06 ENCOUNTER — Ambulatory Visit: Payer: BC Managed Care – PPO | Admitting: Internal Medicine

## 2023-03-06 ENCOUNTER — Encounter: Payer: Self-pay | Admitting: Internal Medicine

## 2023-03-06 VITALS — BP 132/78 | HR 73 | Ht 64.0 in | Wt 236.0 lb

## 2023-03-06 DIAGNOSIS — M546 Pain in thoracic spine: Secondary | ICD-10-CM

## 2023-03-06 DIAGNOSIS — E1165 Type 2 diabetes mellitus with hyperglycemia: Secondary | ICD-10-CM

## 2023-03-06 DIAGNOSIS — E782 Mixed hyperlipidemia: Secondary | ICD-10-CM

## 2023-03-06 DIAGNOSIS — I10 Essential (primary) hypertension: Secondary | ICD-10-CM | POA: Diagnosis not present

## 2023-03-06 DIAGNOSIS — G8929 Other chronic pain: Secondary | ICD-10-CM

## 2023-03-06 DIAGNOSIS — Z6841 Body Mass Index (BMI) 40.0 and over, adult: Secondary | ICD-10-CM

## 2023-03-06 DIAGNOSIS — K219 Gastro-esophageal reflux disease without esophagitis: Secondary | ICD-10-CM

## 2023-03-06 DIAGNOSIS — J4551 Severe persistent asthma with (acute) exacerbation: Secondary | ICD-10-CM

## 2023-03-06 MED ORDER — LABETALOL HCL 100 MG PO TABS: 100.0000 mg | ORAL_TABLET | Freq: Two times a day (BID) | ORAL | 1 refills | Status: DC

## 2023-03-06 MED ORDER — AMLODIPINE-VALSARTAN-HCTZ 10-320-25 MG PO TABS: 1.0000 | ORAL_TABLET | Freq: Every day | ORAL | 1 refills | Status: DC

## 2023-03-06 MED ORDER — FLUTICASONE-SALMETEROL 100-50 MCG/ACT IN AEPB: 1.0000 | INHALATION_SPRAY | Freq: Two times a day (BID) | RESPIRATORY_TRACT | 1 refills | Status: DC

## 2023-03-06 MED ORDER — ALBUTEROL SULFATE HFA 108 (90 BASE) MCG/ACT IN AERS
INHALATION_SPRAY | RESPIRATORY_TRACT | 2 refills | Status: DC
Start: 2023-03-06 — End: 2023-11-22

## 2023-03-06 MED ORDER — ATORVASTATIN CALCIUM 20 MG PO TABS
20.0000 mg | ORAL_TABLET | Freq: Every day | ORAL | 1 refills | Status: DC
Start: 2023-03-06 — End: 2023-12-13

## 2023-03-06 NOTE — Assessment & Plan Note (Signed)
Encourage diet and exercise for weight loss 

## 2023-03-06 NOTE — Assessment & Plan Note (Signed)
Controlled on repeat-valsartan and labetalol Reinforced DASH diet and exercise for weight loss C-Met today

## 2023-03-06 NOTE — Assessment & Plan Note (Signed)
Continue Advair and albuterol Encourage smoking cessation

## 2023-03-06 NOTE — Progress Notes (Signed)
Subjective:    Patient ID: Miguel Perez., male    DOB: Apr 04, 1991, 32 y.o.   MRN: [id number]  HPI  Patient presents to clinic today for follow-up of chronic conditions.  HTN: His BP today is 132/78.  He is taking Amlodipine-Valsartan and Labetalol as prescribed.  ECG from 07/2020 reviewed.  GERD: Triggered by soda, eating late at night, tomato based foods.  He is taking Cimetadine OTC with good relief of symptoms.  There is no upper GI on file.  Asthma: He denies current cough or shortness of breath.  He is taking Advair and Albuterol as prescribed.  There are no PFTs on file.  He does not follow with pulmonology.  DM2: His last A1c was 6.5, 11/2022.  He is not taking any oral diabetic medication at this time.  He does not check his sugars.  He does not check his feet routinely.  His last eye exam was > 1 years.  Flu 10/2021.  Pneumovax never.  COVID never.  HLD: His last LDL was 103, triglycerides 280, 11/2022.  He denies myalgias on Atorvastatin.  He does not consume low-fat diet.  Chronic Back Pain: Improved with weight loss.  He takes Tylenol OTC as needed.  Review of Systems  Past Medical History:  Diagnosis Date   Asthma     Current Outpatient Medications  Medication Sig Dispense Refill   albuterol (VENTOLIN HFA) 108 (90 Base) MCG/ACT inhaler INHALE 1 TO 2 PUFFS BY MOUTH EVERY 6 HOURS AS NEEDED FOR WHEEZING FOR SHORTNESS OF BREATH 18 g 6   amLODIPine-Valsartan-HCTZ 10-320-25 MG TABS Take 1 tablet by mouth once daily 90 tablet 0   atorvastatin (LIPITOR) 20 MG tablet Take 1 tablet (20 mg total) by mouth daily. 90 tablet 3   fluticasone-salmeterol (ADVAIR) 100-50 MCG/ACT AEPB Inhale 1 puff into the lungs 2 (two) times daily. 180 each 3   labetalol (NORMODYNE) 100 MG tablet Take 1 tablet (100 mg total) by mouth 2 (two) times daily. 180 tablet 0   No current facility-administered medications for this visit.    No Known Allergies  Family History  Problem Relation Age of  Onset   Hypertension Mother     Social History   Socioeconomic History   Marital status: Single    Spouse name: Not on file   Number of children: Not on file   Years of education: Not on file   Highest education level: Not on file  Occupational History   Not on file  Tobacco Use   Smoking status: Every Day    Packs/day: 1.00    Years: 11.00    Additional pack years: 0.00    Total pack years: 11.00    Types: Cigarettes   Smokeless tobacco: Never  Vaping Use   Vaping Use: Never used  Substance and Sexual Activity   Alcohol use: Yes    Alcohol/week: 8.0 standard drinks of alcohol    Types: 8 Standard drinks or equivalent per week    Comment: 8 standard on a weekly basis    Drug use: No   Sexual activity: Yes  Other Topics Concern   Not on file  Social History Narrative   Not on file   Social Determinants of Health   Financial Resource Strain: Not on file  Food Insecurity: Not on file  Transportation Needs: Not on file  Physical Activity: Not on file  Stress: Not on file  Social Connections: Not on file  Intimate Partner Violence: Not on  file     Constitutional: Denies fever, malaise, fatigue, headache or abrupt weight changes.  HEENT: Pt reports bilateral ear pain. Denies eye pain, eye redness, ringing in the ears, wax buildup, runny nose, nasal congestion, bloody nose, or sore throat. Respiratory: Denies difficulty breathing, shortness of breath, cough or sputum production.   Cardiovascular: Denies chest pain, chest tightness, palpitations or swelling in the hands or feet.  Gastrointestinal: Denies abdominal pain, bloating, constipation, diarrhea or blood in the stool.  GU: Denies urgency, frequency, pain with urination, burning sensation, blood in urine, odor or discharge. Musculoskeletal: Patient reports chronic back pain.  Denies decrease in range of motion, difficulty with gait, muscle pain or joint swelling.  Skin: Denies redness, rashes, lesions or  ulcercations.  Neurological: Denies dizziness, difficulty with memory, difficulty with speech or problems with balance and coordination.  Psych: Denies anxiety, depression, SI/HI.  No other specific complaints in a complete review of systems (except as listed in HPI above).     Objective:   Physical Exam  BP 132/78   Pulse 73   Ht 5\' 4"  (1.626 m)   Wt 236 lb (107 kg)   SpO2 97%   BMI 40.51 kg/m   Wt Readings from Last 3 Encounters:  11/26/22 252 lb (114.3 kg)  11/01/22 257 lb 3.2 oz (116.7 kg)  06/11/22 250 lb 12.8 oz (113.8 kg)    General: Appears his stated age, obese, in NAD. Skin: Warm, dry and intact. No ulcerations noted. HEENT: Head: normal shape and size; Eyes: sclera white, no icterus, conjunctiva pink, PERRLA and EOMs intact; Ears: Bilateral cerumen impaction;  Cardiovascular: Normal rate and rhythm. S1,S2 noted.  No murmur, rubs or gallops noted. No JVD or BLE edema.  Pulmonary/Chest: Normal effort and positive vesicular breath sounds. No respiratory distress. No wheezes, rales or ronchi noted.  Abdomen: Normal bowel sounds.  Musculoskeletal:  No difficulty with gait.  Neurological: Alert and oriented. Coordination normal.  Psychiatric: Mood and affect normal. Behavior is normal. Judgment and thought content normal.    BMET    Component Value Date/Time   NA 140 11/15/2022 0812   K 4.1 11/15/2022 0812   CL 99 11/15/2022 0812   CO2 28 11/15/2022 0812   GLUCOSE 101 (H) 11/15/2022 0812   BUN 17 11/15/2022 0812   CREATININE 0.97 11/15/2022 0812   CALCIUM 9.5 11/15/2022 0812   GFRNONAA >60 07/01/2020 1104   GFRNONAA 100 05/23/2020 1533   GFRAA >60 07/01/2020 1104   GFRAA 115 05/23/2020 1533    Lipid Panel     Component Value Date/Time   CHOL 191 11/15/2022 0812   TRIG 280 (H) 11/15/2022 0812   HDL 48 11/15/2022 0812   CHOLHDL 4.0 11/15/2022 0812   LDLCALC 103 (H) 11/15/2022 0812    CBC    Component Value Date/Time   WBC 6.7 11/15/2022 0812   RBC  5.35 11/15/2022 0812   HGB 15.3 11/15/2022 0812   HCT 45.0 11/15/2022 0812   PLT 197 11/15/2022 0812   MCV 84.1 11/15/2022 0812   MCH 28.6 11/15/2022 0812   MCHC 34.0 11/15/2022 0812   RDW 13.0 11/15/2022 0812   LYMPHSABS 1.5 07/01/2020 1104   MONOABS 0.5 07/01/2020 1104   EOSABS 0.2 07/01/2020 1104   BASOSABS 0.0 07/01/2020 1104    Hgb A1C Lab Results  Component Value Date   HGBA1C 6.5 (H) 11/15/2022           Assessment & Plan:   Bilateral Cerumen Impaction:  Manual  lavage by CMA Advised him to try Debrox 2 times weekly to prevent wax buildup RTC in 6 months, follow-up chronic conditions Nicki Reaper, NP

## 2023-03-06 NOTE — Assessment & Plan Note (Signed)
Encourage regular stretching and core strengthening Okay to continue Tylenol OTC

## 2023-03-06 NOTE — Assessment & Plan Note (Signed)
A1c and urine microalbumin today Encouraged to consume a low-carb diet and exercise for weight loss Not medicated Encourage routine eye exam Encouraged routine foot exam He declines Pneumovax

## 2023-03-06 NOTE — Assessment & Plan Note (Signed)
Avoid things that trigger reflux Encourage weight loss as this can help reduce reflux symptoms Continues Cimetidine OTC

## 2023-03-06 NOTE — Assessment & Plan Note (Signed)
C-Met and lipid profile today Encouraged him to consume a low-fat diet Continue atorvastatin 

## 2023-03-07 ENCOUNTER — Ambulatory Visit: Payer: BC Managed Care – PPO | Admitting: Internal Medicine

## 2023-03-07 LAB — LIPID PANEL
Cholesterol: 169 mg/dL (ref <200)
HDL: 49 mg/dL (ref >=40)
LDL Cholesterol (Calc): 76 mg/dL (calc)
Non-HDL Cholesterol (Calc): 120 mg/dL (calc) (ref <130)
Total CHOL/HDL Ratio: 3.4 (calc) (ref <5.0)
Triglycerides: 362 mg/dL — ABNORMAL HIGH (ref <150)

## 2023-03-07 LAB — COMPLETE METABOLIC PANEL WITH GFR
AG Ratio: 1.8 (calc) (ref 1.0–2.5)
ALT: 27 U/L (ref 9–46)
AST: 23 U/L (ref 10–40)
Albumin: 4.6 g/dL (ref 3.6–5.1)
Alkaline phosphatase (APISO): 58 U/L (ref 36–130)
BUN/Creatinine Ratio: 21 (calc) (ref 6–22)
BUN: 26 mg/dL — ABNORMAL HIGH (ref 7–25)
CO2: 28 mmol/L (ref 20–32)
Calcium: 9 mg/dL (ref 8.6–10.3)
Chloride: 99 mmol/L (ref 98–110)
Creat: 1.23 mg/dL (ref 0.60–1.26)
Globulin: 2.5 g/dL (calc) (ref 1.9–3.7)
Glucose, Bld: 88 mg/dL (ref 65–139)
Potassium: 4.1 mmol/L (ref 3.5–5.3)
Sodium: 139 mmol/L (ref 135–146)
Total Bilirubin: 0.9 mg/dL (ref 0.2–1.2)
Total Protein: 7.1 g/dL (ref 6.1–8.1)
eGFR: 80 mL/min/{1.73_m2} (ref >=60)

## 2023-03-07 LAB — CBC
HCT: 41.3 % (ref 38.5–50.0)
Hemoglobin: 14 g/dL (ref 13.2–17.1)
MCH: 28.1 pg (ref 27.0–33.0)
MCHC: 33.9 g/dL (ref 32.0–36.0)
MCV: 82.8 fL (ref 80.0–100.0)
MPV: 11.6 fL (ref 7.5–12.5)
Platelets: 210 10*3/uL (ref 140–400)
RBC: 4.99 10*6/uL (ref 4.20–5.80)
RDW: 13.5 % (ref 11.0–15.0)
WBC: 8.3 10*3/uL (ref 3.8–10.8)

## 2023-03-07 LAB — MICROALBUMIN / CREATININE URINE RATIO
Creatinine, Urine: 113 mg/dL (ref 20–320)
Microalb Creat Ratio: 25 mg/g creat (ref <30)
Microalb, Ur: 2.8 mg/dL

## 2023-03-07 LAB — HEMOGLOBIN A1C
Hgb A1c MFr Bld: 6.3 % of total Hgb — ABNORMAL HIGH (ref <5.7)
Mean Plasma Glucose: 134 mg/dL
eAG (mmol/L): 7.4 mmol/L

## 2023-03-08 ENCOUNTER — Encounter: Payer: Self-pay | Admitting: Internal Medicine

## 2023-03-08 DIAGNOSIS — R0683 Snoring: Secondary | ICD-10-CM

## 2023-03-11 MED ORDER — FENOFIBRATE 145 MG PO TABS: 145.0000 mg | ORAL_TABLET | Freq: Every day | ORAL | 1 refills | Status: DC

## 2023-03-26 ENCOUNTER — Encounter: Payer: Self-pay | Admitting: Pulmonary Disease

## 2023-05-03 ENCOUNTER — Ambulatory Visit: Payer: BC Managed Care – PPO | Admitting: Internal Medicine

## 2023-05-03 DIAGNOSIS — R42 Dizziness and giddiness: Secondary | ICD-10-CM | POA: Diagnosis not present

## 2023-05-03 DIAGNOSIS — R5381 Other malaise: Secondary | ICD-10-CM | POA: Diagnosis not present

## 2023-05-03 DIAGNOSIS — Z87891 Personal history of nicotine dependence: Secondary | ICD-10-CM | POA: Diagnosis not present

## 2023-05-03 DIAGNOSIS — Z1152 Encounter for screening for COVID-19: Secondary | ICD-10-CM | POA: Diagnosis not present

## 2023-05-03 DIAGNOSIS — R5383 Other fatigue: Secondary | ICD-10-CM | POA: Diagnosis not present

## 2023-05-03 DIAGNOSIS — F1290 Cannabis use, unspecified, uncomplicated: Secondary | ICD-10-CM | POA: Diagnosis not present

## 2023-05-04 DIAGNOSIS — R5381 Other malaise: Secondary | ICD-10-CM | POA: Diagnosis not present

## 2023-05-06 ENCOUNTER — Ambulatory Visit: Payer: BC Managed Care – PPO | Admitting: Internal Medicine

## 2023-05-06 ENCOUNTER — Encounter: Payer: Self-pay | Admitting: Internal Medicine

## 2023-05-06 VITALS — BP 126/72 | HR 62 | Temp 96.6°F | Wt 235.0 lb

## 2023-05-06 DIAGNOSIS — E1169 Type 2 diabetes mellitus with other specified complication: Secondary | ICD-10-CM | POA: Insufficient documentation

## 2023-05-06 DIAGNOSIS — Z6841 Body Mass Index (BMI) 40.0 and over, adult: Secondary | ICD-10-CM

## 2023-05-06 DIAGNOSIS — I10 Essential (primary) hypertension: Secondary | ICD-10-CM

## 2023-05-06 DIAGNOSIS — J4550 Severe persistent asthma, uncomplicated: Secondary | ICD-10-CM | POA: Diagnosis not present

## 2023-05-06 DIAGNOSIS — E1165 Type 2 diabetes mellitus with hyperglycemia: Secondary | ICD-10-CM

## 2023-05-06 DIAGNOSIS — E785 Hyperlipidemia, unspecified: Secondary | ICD-10-CM

## 2023-05-06 DIAGNOSIS — K219 Gastro-esophageal reflux disease without esophagitis: Secondary | ICD-10-CM

## 2023-05-06 DIAGNOSIS — G8929 Other chronic pain: Secondary | ICD-10-CM

## 2023-05-06 DIAGNOSIS — M546 Pain in thoracic spine: Secondary | ICD-10-CM

## 2023-05-06 MED ORDER — OMEPRAZOLE 20 MG PO CPDR: 20.0000 mg | DELAYED_RELEASE_CAPSULE | Freq: Every day | ORAL | 1 refills | Status: AC

## 2023-05-06 MED ORDER — METHOCARBAMOL 500 MG PO TABS: 500.0000 mg | ORAL_TABLET | Freq: Three times a day (TID) | ORAL | 0 refills | Status: DC | PRN

## 2023-05-06 NOTE — Patient Instructions (Signed)

## 2023-05-06 NOTE — Assessment & Plan Note (Signed)
POCT A1c 6.3% Urine micro albumin has been checked within the last year Encourage low-carb diet and exercise for weight loss Encourage routine eye exam Encouraged foot exam He will get flu and pneumonia in the fall He declines COVID booster

## 2023-05-06 NOTE — Assessment & Plan Note (Signed)
Avoid foods that trigger reflux Encourage weight loss as this can help reduce reflux symptoms Continue omeprazole 

## 2023-05-06 NOTE — Progress Notes (Signed)
Subjective:    Patient ID: Miguel Perez., male    DOB: 07/04/1991, 32 y.o.   MRN: [id number]  HPI  Patient presents to clinic today for 66-month follow-up of chronic conditions.  HTN: His BP today is 126/72.  He is taking amlodipine-valsartan-HCTZ and labetalol as prescribed.  ECG from 07/2020 reviewed.  HLD: His last LDL was 76, triglycerides 409, 03/2023.  He denies myalgias on atorvastatin and fenofibrate.  He tries to consume low-fat diet.  DM2: His last A1c was 6.3%, 03/2023.  He is not taking any oral diabetic medication at this time.  He does not check his sugars.  He does not check his feet routinely.  His last eye exam was > 1 year ago.  Flu 10/2019.  Pneumovax never.  COVID never.  Asthma: Moderate, persistent.  He denies chronic cough or shortness of breath.  He is using advair and albuterol as prescribed.  There are no PFTs on file.  He does not follow with pulmonology.  GERD: Triggered by acidic foods.  He denies breakthrough on omeprazole. There is no upper GI on file.  Chronic thoracic back pain: He reports this has been worse lately. He takes lidocaine patches and ibuprofen as needed with good relief of symptoms.  Xray from 10/2021 reviewed. He does not follow with orthopedics.  Review of Systems     Past Medical History:  Diagnosis Date   Asthma     Current Outpatient Medications  Medication Sig Dispense Refill   albuterol (VENTOLIN HFA) 108 (90 Base) MCG/ACT inhaler INHALE 1 TO 2 PUFFS BY MOUTH EVERY 6 HOURS AS NEEDED FOR WHEEZING FOR SHORTNESS OF BREATH 18 g 2   amLODIPine-Valsartan-HCTZ 10-320-25 MG TABS Take 1 tablet by mouth daily. 90 tablet 1   atorvastatin (LIPITOR) 20 MG tablet Take 1 tablet (20 mg total) by mouth daily. 90 tablet 1   fenofibrate (TRICOR) 145 MG tablet Take 1 tablet (145 mg total) by mouth daily. 90 tablet 1   fexofenadine (ALLEGRA) 60 MG tablet Take 60 mg by mouth 2 (two) times daily.     fluticasone (FLONASE) 50 MCG/ACT nasal spray  Place into both nostrils daily.     fluticasone-salmeterol (ADVAIR) 100-50 MCG/ACT AEPB Inhale 1 puff into the lungs 2 (two) times daily. 180 each 1   labetalol (NORMODYNE) 100 MG tablet Take 1 tablet (100 mg total) by mouth 2 (two) times daily. 180 tablet 1   No current facility-administered medications for this visit.    No Known Allergies  Family History  Problem Relation Age of Onset   Hypertension Mother     Social History   Socioeconomic History   Marital status: Single    Spouse name: Not on file   Number of children: Not on file   Years of education: Not on file   Highest education level: Not on file  Occupational History   Not on file  Tobacco Use   Smoking status: Every Day    Packs/day: 1.00    Years: 11.00    Additional pack years: 0.00    Total pack years: 11.00    Types: Cigarettes   Smokeless tobacco: Never  Vaping Use   Vaping Use: Never used  Substance and Sexual Activity   Alcohol use: Yes    Alcohol/week: 8.0 standard drinks of alcohol    Types: 8 Standard drinks or equivalent per week    Comment: 8 standard on a weekly basis    Drug use: No  Sexual activity: Yes  Other Topics Concern   Not on file  Social History Narrative   Not on file   Social Determinants of Health   Financial Resource Strain: Not on file  Food Insecurity: Not on file  Transportation Needs: Not on file  Physical Activity: Not on file  Stress: Not on file  Social Connections: Not on file  Intimate Partner Violence: Not on file     Constitutional: Denies fever, malaise, fatigue, headache or abrupt weight changes.  HEENT: Denies eye pain, eye redness, ear pain, ringing in the ears, wax buildup, runny nose, nasal congestion, bloody nose, or sore throat. Respiratory: Denies difficulty breathing, shortness of breath, cough or sputum production.   Cardiovascular: Denies chest pain, chest tightness, palpitations or swelling in the hands or feet.  Gastrointestinal: Denies  abdominal pain, bloating, constipation, diarrhea or blood in the stool.  GU: Denies urgency, frequency, pain with urination, burning sensation, blood in urine, odor or discharge. Musculoskeletal: Patient reports chronic back pain.  Denies decrease in range of motion, difficulty with gait, or joint swelling.  Skin: Denies redness, rashes, lesions or ulcercations.  Neurological: Denies dizziness, difficulty with memory, difficulty with speech or problems with balance and coordination.  Psych: Denies anxiety, depression, SI/HI.  No other specific complaints in a complete review of systems (except as listed in HPI above).  Objective:   Physical Exam   BP 126/72 (BP Location: Right Arm, Patient Position: Sitting, Cuff Size: Large)   Pulse 62   Temp (!) 96.6 F (35.9 C) (Temporal)   Wt 235 lb (106.6 kg)   SpO2 98%   BMI 40.34 kg/m   Wt Readings from Last 3 Encounters:  03/06/23 236 lb (107 kg)  11/26/22 252 lb (114.3 kg)  11/01/22 257 lb 3.2 oz (116.7 kg)    General: Appears his stated age, obese, in NAD. Skin: Warm, dry and intact. No ulcerations noted. HEENT: Head: normal shape and size; Eyes: sclera white, no icterus, conjunctiva pink, PERRLA and EOMs intact;  Cardiovascular: Normal rate and rhythm. S1,S2 noted.  No murmur, rubs or gallops noted. No JVD or BLE edema.  Pulmonary/Chest: Normal effort and positive vesicular breath sounds. No respiratory distress. No wheezes, rales or ronchi noted.  Abdomen: Normal bowel sounds.  Musculoskeletal: Pain with palpation over the thoracic spine.  No difficulty with gait.  Neurological: Alert and oriented. Cranial nerves II-XII grossly intact. Coordination normal.  Psychiatric: Mood and affect normal. Behavior is normal. Judgment and thought content normal.    BMET    Component Value Date/Time   NA 139 03/06/2023 1456   K 4.1 03/06/2023 1456   CL 99 03/06/2023 1456   CO2 28 03/06/2023 1456   GLUCOSE 88 03/06/2023 1456   BUN 26 (H)  03/06/2023 1456   CREATININE 1.23 03/06/2023 1456   CALCIUM 9.0 03/06/2023 1456   GFRNONAA >60 07/01/2020 1104   GFRNONAA 100 05/23/2020 1533   GFRAA >60 07/01/2020 1104   GFRAA 115 05/23/2020 1533    Lipid Panel     Component Value Date/Time   CHOL 169 03/06/2023 1456   TRIG 362 (H) 03/06/2023 1456   HDL 49 03/06/2023 1456   CHOLHDL 3.4 03/06/2023 1456   LDLCALC 76 03/06/2023 1456    CBC    Component Value Date/Time   WBC 8.3 03/06/2023 1456   RBC 4.99 03/06/2023 1456   HGB 14.0 03/06/2023 1456   HCT 41.3 03/06/2023 1456   PLT 210 03/06/2023 1456   MCV 82.8 03/06/2023  1456   MCH 28.1 03/06/2023 1456   MCHC 33.9 03/06/2023 1456   RDW 13.5 03/06/2023 1456   LYMPHSABS 1.5 07/01/2020 1104   MONOABS 0.5 07/01/2020 1104   EOSABS 0.2 07/01/2020 1104   BASOSABS 0.0 07/01/2020 1104    Hgb A1C Lab Results  Component Value Date   HGBA1C 6.3 (H) 03/06/2023           Assessment & Plan:     RTC in 6 months for your annual exam Nicki Reaper, NP

## 2023-05-06 NOTE — Assessment & Plan Note (Signed)
Lipid profile reviewed Encouraged low-fat diet  Continue atorvastatin and fenofibrate

## 2023-05-06 NOTE — Assessment & Plan Note (Signed)
Controlled on amlodipine-valsartan-HCTZ and labetalol Reinforced diet and exercise for weight loss Kidney function reviewed

## 2023-05-06 NOTE — Assessment & Plan Note (Signed)
Encourage diet and exercise for weight loss 

## 2023-05-06 NOTE — Assessment & Plan Note (Signed)
Continue Advair and albuterol 

## 2023-05-06 NOTE — Assessment & Plan Note (Signed)
He declines referral for PT, orthopedics or MRI of the thoracic spine at this time Encouraged regular stretching Methocarbamol refilled today

## 2023-05-20 ENCOUNTER — Institutional Professional Consult (permissible substitution): Payer: BC Managed Care – PPO | Admitting: Internal Medicine

## 2023-05-23 ENCOUNTER — Emergency Department: Payer: BC Managed Care – PPO

## 2023-05-23 ENCOUNTER — Other Ambulatory Visit: Payer: Self-pay

## 2023-05-23 DIAGNOSIS — R079 Chest pain, unspecified: Secondary | ICD-10-CM | POA: Diagnosis not present

## 2023-05-23 DIAGNOSIS — R0789 Other chest pain: Secondary | ICD-10-CM | POA: Diagnosis not present

## 2023-05-23 DIAGNOSIS — R202 Paresthesia of skin: Secondary | ICD-10-CM | POA: Diagnosis not present

## 2023-05-23 DIAGNOSIS — I10 Essential (primary) hypertension: Secondary | ICD-10-CM | POA: Insufficient documentation

## 2023-05-23 DIAGNOSIS — E119 Type 2 diabetes mellitus without complications: Secondary | ICD-10-CM | POA: Insufficient documentation

## 2023-05-23 LAB — CBC
HCT: 38.6 % — ABNORMAL LOW (ref 39.0–52.0)
Hemoglobin: 13 g/dL (ref 13.0–17.0)
MCH: 27.7 pg (ref 26.0–34.0)
MCHC: 33.7 g/dL (ref 30.0–36.0)
MCV: 82.3 fL (ref 80.0–100.0)
Platelets: 214 10*3/uL (ref 150–400)
RBC: 4.69 MIL/uL (ref 4.22–5.81)
RDW: 12.8 % (ref 11.5–15.5)
WBC: 6.1 10*3/uL (ref 4.0–10.5)
nRBC: 0 % (ref 0.0–0.2)

## 2023-05-23 LAB — BASIC METABOLIC PANEL
Anion gap: 10 (ref 5–15)
BUN: 23 mg/dL — ABNORMAL HIGH (ref 6–20)
CO2: 22 mmol/L (ref 22–32)
Calcium: 8.7 mg/dL — ABNORMAL LOW (ref 8.9–10.3)
Chloride: 103 mmol/L (ref 98–111)
Creatinine, Ser: 0.92 mg/dL (ref 0.61–1.24)
GFR, Estimated: >60 mL/min (ref >60)
Glucose, Bld: 105 mg/dL — ABNORMAL HIGH (ref 70–99)
Potassium: 3.4 mmol/L — ABNORMAL LOW (ref 3.5–5.1)
Sodium: 135 mmol/L (ref 135–145)

## 2023-05-23 LAB — TROPONIN I (HIGH SENSITIVITY): Troponin I (High Sensitivity): 3 ng/L (ref <18)

## 2023-05-23 NOTE — ED Triage Notes (Signed)
EMS brings pt in from home; c/o Rt sided CP radiating down rt arm after smoking marijuana

## 2023-05-23 NOTE — ED Triage Notes (Signed)
Pt to ED via [facility] c/o left sided CP that started around 6:30 pm tonight. Pt reports he was getting ready to eat dinner when it started. States it radiates up towards neck and had "tingling" sensation on left side earlier but not anymore. Denies SOB, fevers, dizziness

## 2023-05-24 ENCOUNTER — Emergency Department
Admission: EM | Admit: 2023-05-24 | Discharge: 2023-05-24 | Disposition: A | Discharge status: Home / Self Care | Payer: BC Managed Care – PPO | Attending: Emergency Medicine | Admitting: Emergency Medicine

## 2023-05-24 DIAGNOSIS — R001 Bradycardia, unspecified: Secondary | ICD-10-CM | POA: Diagnosis not present

## 2023-05-24 DIAGNOSIS — R0602 Shortness of breath: Secondary | ICD-10-CM | POA: Diagnosis not present

## 2023-05-24 DIAGNOSIS — M549 Dorsalgia, unspecified: Secondary | ICD-10-CM | POA: Diagnosis not present

## 2023-05-24 DIAGNOSIS — R079 Chest pain, unspecified: Secondary | ICD-10-CM | POA: Diagnosis not present

## 2023-05-24 DIAGNOSIS — R748 Abnormal levels of other serum enzymes: Secondary | ICD-10-CM | POA: Diagnosis not present

## 2023-05-24 DIAGNOSIS — R0789 Other chest pain: Secondary | ICD-10-CM

## 2023-05-24 DIAGNOSIS — Z87891 Personal history of nicotine dependence: Secondary | ICD-10-CM | POA: Diagnosis not present

## 2023-05-24 DIAGNOSIS — R5381 Other malaise: Secondary | ICD-10-CM | POA: Diagnosis not present

## 2023-05-24 LAB — TROPONIN I (HIGH SENSITIVITY): Troponin I (High Sensitivity): 4 ng/L (ref <18)

## 2023-05-24 NOTE — ED Provider Notes (Signed)
[facility] Provider Note    Event Date/Time   First MD Initiated Contact with Patient 05/24/23 [phone]     (approximate)   History   Chest Pain   HPI  Miguel Trieu. is a 32 y.o. male who presents to the ED for evaluation of Chest Pain   I review a PCP visit from 7/1.  Morbidly obese patient with history of HTN, HLD, DM, GERD  Patient presents to the ED alongside his wife for by the patient of paresthesias in the left side of his body, L UQ/left-sided lower chest pain.  Symptoms started as he was getting ready to eat dinner.  No postprandial epigastric pain, nausea or emesis.  No right-sided abdominal pain.  Reports feeling better by the time he arrived to the ED and I see him.  No cough or preceding symptoms.   Physical Exam   Triage Vital Signs: ED Triage Vitals  Encounter Vitals Group     BP 05/23/23 2128 114/79     Systolic BP Percentile --      Diastolic BP Percentile --      Pulse Rate 05/23/23 2128 65     Resp 05/23/23 2128 18     Temp 05/23/23 2128 98.1 F (36.7 C)     Temp Source 05/23/23 2128 Oral     SpO2 05/23/23 2128 96 %     Weight 05/23/23 2126 235 lb (106.6 kg)     Height 05/23/23 2126 5\' 5"  (1.651 m)     Head Circumference --      Peak Flow --      Pain Score 05/23/23 2126 5     Pain Loc --      Pain Education --      Exclude from Growth Chart --     Most recent vital signs: Vitals:   05/23/23 2128  BP: 114/79  Pulse: 65  Resp: 18  Temp: 98.1 F (36.7 C)  SpO2: 96%    General: Awake, no distress.  Morbidly obese, well-appearing. CV:  Good peripheral perfusion.  Resp:  Normal effort.  Abd:  No distention.  Soft and benign MSK:  No deformity noted.  Neuro:  No focal deficits appreciated. Other:     ED Results / Procedures / Treatments   Labs (all labs ordered are listed, but only abnormal results are displayed) Labs Reviewed  BASIC METABOLIC PANEL - Abnormal; Notable for the following components:       Result Value   Potassium 3.4 (*)    Glucose, Bld 105 (*)    BUN 23 (*)    Calcium 8.7 (*)    All other components within normal limits  CBC - Abnormal; Notable for the following components:   HCT 38.6 (*)    All other components within normal limits  TROPONIN I (HIGH SENSITIVITY)  TROPONIN I (HIGH SENSITIVITY)    EKG Sinus rhythm with a rate of 61 bpm.  Normal axis.  Incomplete right bundle.  No high-grade AV blocks.  No STEMI.  RADIOLOGY CXR interpreted by me without evidence of acute cardiopulmonary pathology.  Official radiology report(s): DG Chest 2 View  Result Date: 05/23/2023 CLINICAL DATA:  Left-sided chest pain tonight. EXAM: CHEST - 2 VIEW COMPARISON:  07/04/2020 FINDINGS: Normal heart size and pulmonary vascularity. No focal airspace disease or consolidation in the lungs. No blunting of costophrenic angles. No pneumothorax. Mediastinal contours appear intact. Degenerative changes in the thoracic spine with midthoracic wedge compression deformities, unchanged.  IMPRESSION: No active cardiopulmonary disease. Electronically Signed   By: Burman Nieves M.D.   On: 05/23/2023 21:46    PROCEDURES and INTERVENTIONS:  Procedures  Medications - No data to display   IMPRESSION / MDM / ASSESSMENT AND PLAN / ED COURSE  I reviewed the triage vital signs and the nursing notes.  Differential diagnosis includes, but is not limited to, ACS, PTX, PNA, muscle strain/spasm, PE, dissection, anxiety, pleural effusion  {Patient presents with symptoms of an acute illness or injury that is potentially life-threatening.  Patient presents with atypical left-sided chest discomfort that is already resolving without intervention, with benign workup and suitable for outpatient management.  Reassuring workup with nonischemic EKG, negative troponin, reassuring blood work and CXR.  Normal CBC.  Essentially normal metabolic panel.  As below, discharged without second troponin, discussed return  precautions  Clinical Course as of 05/24/23 0220  Fri May 24, 2023  0219 Unfortunately, her facility has some sort of network issue/that we are not having any labs, imaging, reads.  We can see old results but cannot acquire new ones.  I reassessed the patient and he still is feeling better and looks well.  We discussed reassuring workup so far and inability to get a second troponin.  We had planned on obtaining a second troponin but when discussing this with the patient, he is comfortable going home considering he is feeling better and has a reassuring workup so far.  I think this is reasonable.  We discussed close return precautions. [DS]    Clinical Course User Index [DS] Delton Prairie, MD     FINAL CLINICAL IMPRESSION(S) / ED DIAGNOSES   Final diagnoses:  None     Rx / DC Orders   ED Discharge Orders     None        Note:  This document was prepared using Dragon voice recognition software and may include unintentional dictation errors.   Delton Prairie, MD 05/24/23 [phone]

## 2023-05-24 NOTE — Discharge Instructions (Signed)
Please take Tylenol and ibuprofen/Advil for your pain.  It is safe to take them together, or to alternate them every few hours.  Take up to 1000mg of Tylenol at a time, up to 4 times per day.  Do not take more than 4000 mg of Tylenol in 24 hours.  For ibuprofen, take 400-600 mg, 3 - 4 times per day.  

## 2023-05-25 DIAGNOSIS — R001 Bradycardia, unspecified: Secondary | ICD-10-CM | POA: Diagnosis not present

## 2023-06-07 ENCOUNTER — Encounter: Payer: Self-pay | Admitting: Internal Medicine

## 2023-06-07 DIAGNOSIS — G8929 Other chronic pain: Secondary | ICD-10-CM

## 2023-06-20 ENCOUNTER — Ambulatory Visit: Payer: BC Managed Care – PPO | Admitting: Internal Medicine

## 2023-06-20 ENCOUNTER — Encounter: Payer: Self-pay | Admitting: Internal Medicine

## 2023-06-20 ENCOUNTER — Ambulatory Visit
Admission: RE | Admit: 2023-06-20 | Discharge: 2023-06-20 | Disposition: A | Discharge status: Home / Self Care | Payer: BC Managed Care – PPO | Source: Home / Self Care | Attending: Internal Medicine | Admitting: Internal Medicine

## 2023-06-20 ENCOUNTER — Ambulatory Visit
Admission: RE | Admit: 2023-06-20 | Discharge: 2023-06-20 | Disposition: A | Discharge status: Home / Self Care | Payer: BC Managed Care – PPO | Source: Ambulatory Visit | Attending: Internal Medicine | Admitting: Internal Medicine

## 2023-06-20 VITALS — BP 134/70 | HR 58 | Temp 96.6°F | Wt 233.0 lb

## 2023-06-20 DIAGNOSIS — G8929 Other chronic pain: Secondary | ICD-10-CM

## 2023-06-20 DIAGNOSIS — M549 Dorsalgia, unspecified: Secondary | ICD-10-CM | POA: Diagnosis not present

## 2023-06-20 DIAGNOSIS — M546 Pain in thoracic spine: Secondary | ICD-10-CM

## 2023-06-20 DIAGNOSIS — M8580 Other specified disorders of bone density and structure, unspecified site: Secondary | ICD-10-CM | POA: Diagnosis not present

## 2023-06-20 NOTE — Patient Instructions (Signed)

## 2023-06-20 NOTE — Progress Notes (Signed)
Subjective:    Patient ID: Miguel Perez., male    DOB: 08-02-1991, 32 y.o.   MRN: [id number]  HPI  Patient presents to clinic today for follow-up of chronic left side mid back pain.  This is an ongoing issue that he feels like has worsened.  He describes the pain as sharp and stabbing.  The pain does not radiate but he reports numbness and tingling in the left subscapular region.  He denies numbness, tingling or weakness of the left upper extremity.  He takes methocarbamol and tylenol as needed with some relief of symptoms.  There is no x-ray on file.  Review of Systems  Past Medical History:  Diagnosis Date   Asthma     Current Outpatient Medications  Medication Sig Dispense Refill   albuterol (VENTOLIN HFA) 108 (90 Base) MCG/ACT inhaler INHALE 1 TO 2 PUFFS BY MOUTH EVERY 6 HOURS AS NEEDED FOR WHEEZING FOR SHORTNESS OF BREATH 18 g 2   amLODIPine-Valsartan-HCTZ 10-320-25 MG TABS Take 1 tablet by mouth daily. 90 tablet 1   atorvastatin (LIPITOR) 20 MG tablet Take 1 tablet (20 mg total) by mouth daily. 90 tablet 1   fenofibrate (TRICOR) 145 MG tablet Take 1 tablet (145 mg total) by mouth daily. 90 tablet 1   fluticasone (FLONASE) 50 MCG/ACT nasal spray Place into both nostrils daily.     fluticasone-salmeterol (ADVAIR) 100-50 MCG/ACT AEPB Inhale 1 puff into the lungs 2 (two) times daily. 180 each 1   labetalol (NORMODYNE) 100 MG tablet Take 1 tablet (100 mg total) by mouth 2 (two) times daily. 180 tablet 1   methocarbamol (ROBAXIN) 500 MG tablet Take 1 tablet (500 mg total) by mouth every 8 (eight) hours as needed for muscle spasms. 60 tablet 0   omeprazole (PRILOSEC) 20 MG capsule Take 1 capsule (20 mg total) by mouth daily. 90 capsule 1   No current facility-administered medications for this visit.    No Known Allergies  Family History  Problem Relation Age of Onset   Hypertension Mother     Social History   Socioeconomic History   Marital status: Single    Spouse  name: Not on file   Number of children: Not on file   Years of education: Not on file   Highest education level: Not on file  Occupational History   Not on file  Tobacco Use   Smoking status: Former    Current packs/day: 1.00    Average packs/day: 1 pack/day for 11.0 years (11.0 ttl pk-yrs)    Types: Cigarettes   Smokeless tobacco: Never  Vaping Use   Vaping status: Every Day  Substance and Sexual Activity   Alcohol use: Yes    Alcohol/week: 8.0 standard drinks of alcohol    Types: 8 Standard drinks or equivalent per week    Comment: 8 standard on a weekly basis    Drug use: No   Sexual activity: Yes  Other Topics Concern   Not on file  Social History Narrative   Not on file   Social Determinants of Health   Financial Resource Strain: Not on file  Food Insecurity: Not on file  Transportation Needs: Not on file  Physical Activity: Not on file  Stress: Not on file  Social Connections: Not on file  Intimate Partner Violence: Not on file     Constitutional: Denies fever, malaise, fatigue, headache or abrupt weight changes.  HEENT: Denies eye pain, eye redness, ear pain, ringing in the ears, wax  buildup, runny nose, nasal congestion, bloody nose, or sore throat. Respiratory: Denies difficulty breathing, shortness of breath, cough or sputum production.   Cardiovascular: Denies chest pain, chest tightness, palpitations or swelling in the hands or feet.  Gastrointestinal: Denies abdominal pain, bloating, constipation, diarrhea or blood in the stool.  GU: Denies urgency, frequency, pain with urination, burning sensation, blood in urine, odor or discharge. Musculoskeletal: Patient reports chronic left-sided thoracic back pain.  Denies decrease in range of motion, difficulty with gait, or joint swelling.  Skin: Denies redness, rashes, lesions or ulcercations.  Neurological: Patient reports numbness and tingling in the left subscapular region.  Denies dizziness, difficulty with  memory, difficulty with speech or problems with balance and coordination.  Psych: Denies anxiety, depression, SI/HI.  No other specific complaints in a complete review of systems (except as listed in HPI above).     Objective:   Physical Exam  BP 134/70 (BP Location: Left Arm, Patient Position: Sitting, Cuff Size: Large)   Pulse (!) 58   Temp (!) 96.6 F (35.9 C) (Temporal)   Wt 233 lb (105.7 kg)   SpO2 95%   BMI 38.77 kg/m   Wt Readings from Last 3 Encounters:  05/23/23 235 lb (106.6 kg)  05/06/23 235 lb (106.6 kg)  03/06/23 236 lb (107 kg)    General: Appears his stated age, obese, in NAD. Cardiovascular: Bradycardic with normal and rhythm.  Pulmonary/Chest: Normal effort and positive vesicular breath sounds. No respiratory distress. No wheezes, rales or ronchi noted.  Musculoskeletal: Normal flexion, extension, rotation and lateral bending of the spine.  No bony tenderness noted over the lumbar spine.  Pain with palpation in the left subscapular region.  Strength 5/5 BUE.  Handgrips equal.  No difficulty with gait.  Neurological: Alert and oriented.  Coordination normal.     BMET    Component Value Date/Time   NA 135 05/23/2023 2133   K 3.4 (L) 05/23/2023 2133   CL 103 05/23/2023 2133   CO2 22 05/23/2023 2133   GLUCOSE 105 (H) 05/23/2023 2133   BUN 23 (H) 05/23/2023 2133   CREATININE 0.92 05/23/2023 2133   CREATININE 1.23 03/06/2023 1456   CALCIUM 8.7 (L) 05/23/2023 2133   GFRNONAA >60 05/23/2023 2133   GFRNONAA 100 05/23/2020 1533   GFRAA >60 07/01/2020 1104   GFRAA 115 05/23/2020 1533    Lipid Panel     Component Value Date/Time   CHOL 169 03/06/2023 1456   TRIG 362 (H) 03/06/2023 1456   HDL 49 03/06/2023 1456   CHOLHDL 3.4 03/06/2023 1456   LDLCALC 76 03/06/2023 1456    CBC    Component Value Date/Time   WBC 6.1 05/23/2023 2133   RBC 4.69 05/23/2023 2133   HGB 13.0 05/23/2023 2133   HCT 38.6 (L) 05/23/2023 2133   PLT 214 05/23/2023 2133   MCV  82.3 05/23/2023 2133   MCH 27.7 05/23/2023 2133   MCHC 33.7 05/23/2023 2133   RDW 12.8 05/23/2023 2133   LYMPHSABS 1.5 07/01/2020 1104   MONOABS 0.5 07/01/2020 1104   EOSABS 0.2 07/01/2020 1104   BASOSABS 0.0 07/01/2020 1104    Hgb A1C Lab Results  Component Value Date   HGBA1C 6.3 (H) 03/06/2023           Assessment & Plan:    Chronic left-sided thoracic back pain:  X-ray thoracic spine today Encouraged daily stretching Continue Tylenol and methocarbamol as needed Massage may be helpful Discussed referral for PT versus MRI pending x-ray results  RTC in 5 months for your annual exam Nicki Reaper, NP

## 2023-07-02 NOTE — Telephone Encounter (Signed)
Please review.  KP

## 2023-07-03 NOTE — Addendum Note (Signed)
Addended by: Lorre Munroe on: 07/03/2023 08:48 AM   Modules accepted: Orders

## 2023-08-02 DIAGNOSIS — M546 Pain in thoracic spine: Secondary | ICD-10-CM | POA: Diagnosis not present

## 2023-08-08 ENCOUNTER — Encounter: Payer: Self-pay | Admitting: Internal Medicine

## 2023-08-08 ENCOUNTER — Ambulatory Visit: Payer: BC Managed Care – PPO | Admitting: Internal Medicine

## 2023-08-08 VITALS — BP 126/74 | HR 72 | Ht 65.0 in | Wt 235.0 lb

## 2023-08-08 DIAGNOSIS — R42 Dizziness and giddiness: Secondary | ICD-10-CM

## 2023-08-08 DIAGNOSIS — E119 Type 2 diabetes mellitus without complications: Secondary | ICD-10-CM | POA: Diagnosis not present

## 2023-08-08 DIAGNOSIS — R61 Generalized hyperhidrosis: Secondary | ICD-10-CM | POA: Diagnosis not present

## 2023-08-08 DIAGNOSIS — R232 Flushing: Secondary | ICD-10-CM

## 2023-08-08 DIAGNOSIS — E1165 Type 2 diabetes mellitus with hyperglycemia: Secondary | ICD-10-CM | POA: Diagnosis not present

## 2023-08-08 MED ORDER — BLOOD GLUCOSE TEST VI STRP: ORAL_STRIP | 1 refills | Status: AC

## 2023-08-08 MED ORDER — LANCETS MISC. MISC: 1 refills | Status: DC

## 2023-08-08 MED ORDER — LANCET DEVICE MISC: 0 refills | Status: AC

## 2023-08-08 MED ORDER — BLOOD GLUCOSE MONITORING SUPPL DEVI: 0 refills | Status: AC

## 2023-08-08 NOTE — Patient Instructions (Signed)
Dizziness Dizziness is a common problem. It makes you feel unsteady or light-headed. You may feel like you are about to pass out (faint). Dizziness can lead to getting hurt if you stumble or fall. Dizziness can be caused by many things, including: Medicines. Not having enough water in your body (dehydration). Illness. Follow these instructions at home: Eating and drinking  Drink enough fluid to keep your pee (urine) pale yellow. This helps to keep you from getting dehydrated. Try to drink more clear fluids, such as water. Do not drink alcohol. Limit how much caffeine you drink or eat, if your doctor tells you to do that. Limit how much salt (sodium) you drink or eat, if your doctor tells you to do that. Activity  Avoid making quick movements. Stand up slowly from sitting in a chair, and steady yourself until you feel okay. In the morning, first sit up on the side of the bed. When you feel okay, stand up slowly while you hold onto something. Do this until you know that your balance is okay. If you need to stand in one place for a long time, move your legs often. Tighten and relax the muscles in your legs while you are standing. Do not drive or use machinery if you feel dizzy. Avoid bending down if you feel dizzy. Place items in your home so you can reach them easily without leaning over. Lifestyle Do not smoke or use any products that contain nicotine or tobacco. If you need help quitting, ask your doctor. Try to lower your stress level. You can do this by using methods such as yoga or meditation. Talk with your doctor if you need help. General instructions Watch your dizziness for any changes. Take over-the-counter and prescription medicines only as told by your doctor. Talk with your doctor if you think that you are dizzy because of a medicine that you are taking. Tell a friend or a family member that you are feeling dizzy. If he or she notices any changes in your behavior, have this  person call your doctor. Keep all follow-up visits. Contact a doctor if: Your dizziness does not go away. Your dizziness or light-headedness gets worse. You feel like you may vomit (are nauseous). You have trouble hearing. You have new symptoms. You are unsteady on your feet. You feel like the room is spinning. You have neck pain or a stiff neck. You have a fever. Get help right away if: You vomit or have watery poop (diarrhea), and you cannot eat or drink anything. You have trouble: Talking. Walking. Swallowing. Using your arms, hands, or legs. You feel generally weak. You are not thinking clearly, or you have trouble forming sentences. A friend or family member may notice this. You have: Chest pain. Pain in your belly (abdomen). Shortness of breath. Sweating. Your vision changes. You are bleeding. You have a very bad headache. These symptoms may be an emergency. Get help right away. Call your local emergency services (911 in the [place] Do not wait to see if the symptoms will go away. Do not drive yourself to the hospital. Summary Dizziness makes you feel unsteady or light-headed. You may feel like you are about to pass out (faint). Drink enough fluid to keep your pee (urine) pale yellow. Do not drink alcohol. Avoid making quick movements if you feel dizzy. Watch your dizziness for any changes. This information is not intended to replace advice given to you by your health care provider. Make sure you discuss any questions   you have with your health care provider. Document Revised: 09/23/2020 Document Reviewed: 09/26/2020 Elsevier Patient Education  2024 [employer]  

## 2023-08-08 NOTE — Addendum Note (Signed)
Addended by: Shirley Muscat on: 08/08/2023 02:47 PM   Modules accepted: Orders

## 2023-08-08 NOTE — Progress Notes (Signed)
Subjective:    Patient ID: Miguel Perez., male    DOB: Sep 17, 1991, 32 y.o.   MRN: [id number]  HPI  Discussed the use of AI scribe software for clinical note transcription with the patient, who gave verbal consent to proceed.  History of Present Illness   The patient presented with an episode offlusing, dizziness, and excessive sweating while at work. The symptoms lasted for approximately fifteen minutes and recurred the following day. The patient was unsure if these episodes were related to a drop in blood sugar. He had been eating and drinking normally on the day of the episode and did not report any excessive heat at his workplace.  The patient had recently started a new medication regimen for his back, prescribed by his orthopedic doctor. This included a 4mg  muscle relaxer and an anti-inflammatory. However, these medications were not taken before work, and the patient did not believe they were related to the episodes.  The patient denied any sinus symptoms, chest pain, shortness of breath, vomiting, or diarrhea. He also reported no changes in diet or other lifestyle factors in the weeks leading up to the episodes. The patient's blood pressure was noted to be normal.       Review of Systems  Past Medical History:  Diagnosis Date   Asthma     Current Outpatient Medications  Medication Sig Dispense Refill   albuterol (VENTOLIN HFA) 108 (90 Base) MCG/ACT inhaler INHALE 1 TO 2 PUFFS BY MOUTH EVERY 6 HOURS AS NEEDED FOR WHEEZING FOR SHORTNESS OF BREATH 18 g 2   amLODIPine-Valsartan-HCTZ 10-320-25 MG TABS Take 1 tablet by mouth daily. 90 tablet 1   atorvastatin (LIPITOR) 20 MG tablet Take 1 tablet (20 mg total) by mouth daily. 90 tablet 1   fenofibrate (TRICOR) 145 MG tablet Take 1 tablet (145 mg total) by mouth daily. 90 tablet 1   fluticasone (FLONASE) 50 MCG/ACT nasal spray Place into both nostrils daily.     fluticasone-salmeterol (ADVAIR) 100-50 MCG/ACT AEPB Inhale 1 puff into  the lungs 2 (two) times daily. 180 each 1   labetalol (NORMODYNE) 100 MG tablet Take 1 tablet (100 mg total) by mouth 2 (two) times daily. 180 tablet 1   methocarbamol (ROBAXIN) 500 MG tablet Take 1 tablet (500 mg total) by mouth every 8 (eight) hours as needed for muscle spasms. 60 tablet 0   omeprazole (PRILOSEC) 20 MG capsule Take 1 capsule (20 mg total) by mouth daily. 90 capsule 1   No current facility-administered medications for this visit.    No Known Allergies  Family History  Problem Relation Age of Onset   Hypertension Mother     Social History   Socioeconomic History   Marital status: Single    Spouse name: Not on file   Number of children: Not on file   Years of education: Not on file   Highest education level: Not on file  Occupational History   Not on file  Tobacco Use   Smoking status: Former    Current packs/day: 1.00    Average packs/day: 1 pack/day for 11.0 years (11.0 ttl pk-yrs)    Types: Cigarettes   Smokeless tobacco: Never  Vaping Use   Vaping status: Every Day  Substance and Sexual Activity   Alcohol use: Yes    Alcohol/week: 8.0 standard drinks of alcohol    Types: 8 Standard drinks or equivalent per week    Comment: 8 standard on a weekly basis    Drug use:  No   Sexual activity: Yes  Other Topics Concern   Not on file  Social History Narrative   Not on file   Social Determinants of Health   Financial Resource Strain: Not on file  Food Insecurity: Not on file  Transportation Needs: Not on file  Physical Activity: Not on file  Stress: Not on file  Social Connections: Not on file  Intimate Partner Violence: Not on file     Constitutional: Denies fever, malaise, fatigue, headache or abrupt weight changes.  HEENT: Denies eye pain, eye redness, ear pain, ringing in the ears, wax buildup, runny nose, nasal congestion, bloody nose, or sore throat. Respiratory: Denies difficulty breathing, shortness of breath, cough or sputum production.    Cardiovascular: Denies chest pain, chest tightness, palpitations or swelling in the hands or feet.  Gastrointestinal: Denies abdominal pain, bloating, constipation, diarrhea or blood in the stool.  GU: Denies urgency, frequency, pain with urination, burning sensation, blood in urine, odor or discharge. Musculoskeletal: Patient reports chronic back pain.  Denies decrease in range of motion, difficulty with gait, muscle pain or joint swelling.  Skin: Patient reports flushing and sweating.  Denies redness, rashes, lesions or ulcercations.  Neurological: Patient reports dizziness.  Denies difficulty with memory, difficulty with speech or problems with balance and coordination.  Psych: Denies anxiety, depression, SI/HI.  No other specific complaints in a complete review of systems (except as listed in HPI above).     Objective:   Physical Exam  BP 126/74   Pulse 72   Ht 5\' 5"  (1.651 m)   Wt 235 lb (106.6 kg)   SpO2 95%   BMI 39.11 kg/m   Wt Readings from Last 3 Encounters:  06/20/23 233 lb (105.7 kg)  05/23/23 235 lb (106.6 kg)  05/06/23 235 lb (106.6 kg)    General: Appears his stated age, obese, in NAD. Skin: Warm, dry and intact. HEENT: Head: normal shape and size; Eyes: sclera white, no icterus, conjunctiva pink, PERRLA and EOMs intact; Ears: Tm's gray and intact, normal light reflex;  Cardiovascular: Normal rate and rhythm. S1,S2 noted.  No murmur, rubs or gallops noted.  Pulmonary/Chest: Normal effort and positive vesicular breath sounds. No respiratory distress. No wheezes, rales or ronchi noted.  Musculoskeletal: No difficulty with gait.  Neurological: Alert and oriented. Cranial nerves II-XII grossly intact. Coordination normal.    BMET    Component Value Date/Time   NA 135 05/23/2023 2133   K 3.4 (L) 05/23/2023 2133   CL 103 05/23/2023 2133   CO2 22 05/23/2023 2133   GLUCOSE 105 (H) 05/23/2023 2133   BUN 23 (H) 05/23/2023 2133   CREATININE 0.92 05/23/2023 2133    CREATININE 1.23 03/06/2023 1456   CALCIUM 8.7 (L) 05/23/2023 2133   GFRNONAA >60 05/23/2023 2133   GFRNONAA 100 05/23/2020 1533   GFRAA >60 07/01/2020 1104   GFRAA 115 05/23/2020 1533    Lipid Panel     Component Value Date/Time   CHOL 169 03/06/2023 1456   TRIG 362 (H) 03/06/2023 1456   HDL 49 03/06/2023 1456   CHOLHDL 3.4 03/06/2023 1456   LDLCALC 76 03/06/2023 1456    CBC    Component Value Date/Time   WBC 6.1 05/23/2023 2133   RBC 4.69 05/23/2023 2133   HGB 13.0 05/23/2023 2133   HCT 38.6 (L) 05/23/2023 2133   PLT 214 05/23/2023 2133   MCV 82.3 05/23/2023 2133   MCH 27.7 05/23/2023 2133   MCHC 33.7 05/23/2023 2133  RDW 12.8 05/23/2023 2133   LYMPHSABS 1.5 07/01/2020 1104   MONOABS 0.5 07/01/2020 1104   EOSABS 0.2 07/01/2020 1104   BASOSABS 0.0 07/01/2020 1104    Hgb A1C Lab Results  Component Value Date   HGBA1C 6.3 (H) 03/06/2023           Assessment & Plan:  Assessment and Plan    Episodes of Dizziness, Sweating, and Flushing Occurred at work, resolved spontaneously. No clear precipitating factors identified. Neurological examination normal. Differential includes hypoglycemia, hyperglycemia, dehydration, and heat exhaustion. -Check blood glucose levels during future episodes- meter, strips and lancets sent to pharmacy. -Order basic labs including kidney function and A1C. -Advise regular meals with balanced macronutrient content and adequate hydration. -Ensure good ventilation at work.         RTC in 5 months for your annual exam Nicki Reaper, NP

## 2023-08-09 LAB — BASIC METABOLIC PANEL WITH GFR
BUN/Creatinine Ratio: 18 (calc) (ref 6–22)
BUN: 29 mg/dL — ABNORMAL HIGH (ref 7–25)
CO2: 26 mmol/L (ref 20–32)
Calcium: 9.1 mg/dL (ref 8.6–10.3)
Chloride: 104 mmol/L (ref 98–110)
Creat: 1.59 mg/dL — ABNORMAL HIGH (ref 0.60–1.26)
Glucose, Bld: 82 mg/dL (ref 65–99)
Potassium: 4.1 mmol/L (ref 3.5–5.3)
Sodium: 140 mmol/L (ref 135–146)
eGFR: 59 mL/min/{1.73_m2} — ABNORMAL LOW (ref >=60)

## 2023-08-09 LAB — HEMOGLOBIN A1C
Hgb A1c MFr Bld: 6.2 %{Hb} — ABNORMAL HIGH (ref <5.7)
Mean Plasma Glucose: 131 mg/dL
eAG (mmol/L): 7.3 mmol/L

## 2023-08-21 ENCOUNTER — Ambulatory Visit: Payer: Self-pay

## 2023-08-21 NOTE — Telephone Encounter (Signed)
2nd attempt, Patient called, left VM to return the call to the office to speak to the NT.    Summary: dizziness / contact req    The patient has experienced dizziness and light headedness for roughly two weeks off and on and would like to speak with a member of staff further when possible  The patient has checked their blood sugar which was 91 at the time of call and their BP was 112/64  Please contact further when possible

## 2023-08-21 NOTE — Telephone Encounter (Signed)
Pt called, unable to get thru d/t recording, "call cannot be completed at this time..."  Summary: dizziness / contact req    The patient has experienced dizziness and light headedness for roughly two weeks off and on and would like to speak with a member of staff further when possible  The patient has checked their blood sugar which was 91 at the time of call and their BP was 112/64  Please contact further when possible

## 2023-08-21 NOTE — Telephone Encounter (Signed)
  Chief Complaint: Dizziness Symptoms: Lightheaded, "Comes on quick, feel heart beating fast, flushed."   Frequency: Seen OV 08/08/23 Pertinent Negatives: Patient denies nausea, spinning,visual changes, weakness Disposition: [] ED /[] Urgent Care (no appt availability in office) / [x] Appointment(In office/virtual)/ []  [facility]/ [] Home Care/ [] Refused Recommended Disposition /[] [facility]/ []  Follow-up with PCP Additional Notes: States was advised to monitor BP when occurs. This is first episode since OV eval. Prior to call, BP 112/64  and 122/51. Reports "Somewhat" worse going from sitting to standing. Pt did take Robaxin before BP measures.  BS 91. No availability at practice, secured apt with [facility] tomorrow. Care advise provided, pt and wife verbalize understanding. Reason for Disposition  [1] MODERATE dizziness (e.g., interferes with normal activities) AND [2] has been evaluated by doctor (or NP/PA) for this  Answer Assessment - Initial Assessment Questions 1. DESCRIPTION: "Describe your dizziness."     "Comes on quickly, lightheaded, hears heart beating fast 2. LIGHTHEADED: "Do you feel lightheaded?" (e.g., somewhat faint, woozy, weak upon standing)     Yes 3. VERTIGO: "Do you feel like either you or the room is spinning or tilting?" (i.e. vertigo)     No 4. SEVERITY: "How bad is it?"  "Do you feel like you are going to faint?" "Can you stand and walk?"   - MILD: Feels slightly dizzy, but walking normally.   - MODERATE: Feels unsteady when walking, but not falling; interferes with normal activities (e.g., school, work).   - SEVERE: Unable to walk without falling, or requires assistance to walk without falling; feels like passing out now.      Moderate 5. ONSET:  "When did the dizziness begin?"     2 weeks ago, comes and goes 6. AGGRAVATING FACTORS: "Does anything make it worse?" (e.g., standing, change in head position)     SOme what worse sitting to standing 7.  HEART RATE: "Can you tell me your heart rate?" "How many beats in 15 seconds?"  (Note: not all patients can do this)       NA 8. CAUSE: "What do you think is causing the dizziness?"     Unsure 9. RECURRENT SYMPTOM: "Have you had dizziness before?" If Yes, ask: "When was the last time?" "What happened that time?"     Yes 10. OTHER SYMPTOMS: "Do you have any other symptoms?" (e.g., fever, chest pain, vomiting, diarrhea, bleeding)       No  Protocols used: Dizziness - Lightheadedness-A-AH

## 2023-08-22 ENCOUNTER — Encounter: Payer: Self-pay | Admitting: Physician Assistant

## 2023-08-22 ENCOUNTER — Ambulatory Visit: Payer: BC Managed Care – PPO | Admitting: Physician Assistant

## 2023-08-22 VITALS — BP 138/80 | HR 74 | Temp 97.5°F | Resp 16 | Ht 65.0 in | Wt 233.5 lb

## 2023-08-22 DIAGNOSIS — R42 Dizziness and giddiness: Secondary | ICD-10-CM | POA: Diagnosis not present

## 2023-08-22 DIAGNOSIS — R079 Chest pain, unspecified: Secondary | ICD-10-CM | POA: Diagnosis not present

## 2023-08-22 DIAGNOSIS — Z1321 Encounter for screening for nutritional disorder: Secondary | ICD-10-CM | POA: Diagnosis not present

## 2023-08-22 NOTE — Patient Instructions (Signed)
I recommend taking a half dose of the Labetolol and see if this improves your dizziness. Try taking the half dose at night so you can have extended BP coverage throughout the day and night.   If you continue to have similar symptoms please let us know.

## 2023-08-22 NOTE — Progress Notes (Signed)
Acute Office Visit   Patient: Miguel Perez.   DOB: 04/21/1991   32 y.o. Male  MRN: [id number] Visit Date: 08/22/2023  Today's healthcare provider: Oswaldo Conroy Torian Thoennes, PA-C  Introduced myself to the patient as a Secondary school teacher and provided education on APPs in clinical practice.    Chief Complaint  Patient presents with   Dizziness    2 episodes within the last 2 weeks   Chest Pain    Left side   Subjective    HPI HPI     Dizziness    Additional comments: 2 episodes within the last 2 weeks        Chest Pain    Additional comments: Left side      Last edited by Forde Radon, CMA on 08/22/2023  2:38 PM.       DIZZINESS Duration: weeks Description of symptoms:  feels like he is getting tunnel vision  Duration of episode: seconds Dizziness frequency: no history of the same Provoking factors:  unsure  Aggravating factors:   may be stress related- they are not sure  Triggered by rolling over in bed: no Triggered by bending over: no Aggravated by head movement: no Aggravated by exertion, coughing, loud noises:  may have sensations from coughing Recent head injury: no Recent or current viral symptoms: no History of vasovagal episodes: no Nausea: no Vomiting: no Tinnitus:  every once in awhile  Hearing loss: no Aural fullness: no Headache:  rarely  Photophobia/phonophobia: no Unsteady gait: no Postural instability: no Diplopia, dysarthria, dysphagia or weakness: no Related to exertion:  potentially  Pallor: yes- companion states when he had episode he became very pale  Diaphoresis: no Dyspnea: yes- every once in a while  Chest pain: yes  He reports feeling a jolt from his chest that goes up to his head and causes dizziness He denies chest pain as reproducible He denies heartburn symptoms or GI upset He states he checked his glucose level yesterday during an episode and it was in the 90s  Reports checking BP about an hour after an episode and  it was 112/50s   He has PT tomorrow for his left sided back pain   He typically drinks about 4 (17 oz)  bottles of water per day along with diet sodas every few days He is eating regular meals throughout the day   He tried skipping a Labetolol dose this AM to see if that helped, he took his combo medication     Medications: Outpatient Medications Prior to Visit  Medication Sig   albuterol (VENTOLIN HFA) 108 (90 Base) MCG/ACT inhaler INHALE 1 TO 2 PUFFS BY MOUTH EVERY 6 HOURS AS NEEDED FOR WHEEZING FOR SHORTNESS OF BREATH   amLODIPine-Valsartan-HCTZ 10-320-25 MG TABS Take 1 tablet by mouth daily.   atorvastatin (LIPITOR) 20 MG tablet Take 1 tablet (20 mg total) by mouth daily.   Blood Glucose Monitoring Suppl DEVI Used to check blood sugar once a day. DX: E11.9   May substitute to any manufacturer covered by patient's insurance.   fenofibrate (TRICOR) 145 MG tablet Take 1 tablet (145 mg total) by mouth daily.   fluticasone (FLONASE) 50 MCG/ACT nasal spray Place into both nostrils daily.   fluticasone-salmeterol (ADVAIR) 100-50 MCG/ACT AEPB Inhale 1 puff into the lungs 2 (two) times daily.   Glucose Blood (BLOOD GLUCOSE TEST STRIPS) STRP Used to check blood sugar once a day. DX: E11.9   May substitute to any  manufacturer covered by [employer].   labetalol (NORMODYNE) 100 MG tablet Take 1 tablet (100 mg total) by mouth 2 (two) times daily.   Lancet Device MISC Used to check blood sugar once a day. DX: E11.9   May substitute to any manufacturer covered by patient's insurance.   Lancets Misc. MISC Used to check blood sugar once a day. DX: E11.9   May substitute to any manufacturer covered by patient's insurance.   methocarbamol (ROBAXIN) 500 MG tablet Take 1 tablet (500 mg total) by mouth every 8 (eight) hours as needed for muscle spasms.   omeprazole (PRILOSEC) 20 MG capsule Take 1 capsule (20 mg total) by mouth daily.   No facility-administered medications prior to visit.     Review of Systems  Eyes:  Negative for visual disturbance.  Respiratory:  Positive for shortness of breath. Negative for cough.   Cardiovascular:  Positive for chest pain. Negative for palpitations.  Gastrointestinal:  Negative for abdominal pain, constipation, diarrhea, nausea and vomiting.  Neurological:  Positive for dizziness and light-headedness. Negative for headaches.        Objective    BP 138/80   Pulse 74   Temp (!) 97.5 F (36.4 C) (Oral)   Resp 16   Ht 5\' 5"  (1.651 m)   Wt 233 lb 8 oz (105.9 kg)   SpO2 98%   BMI 38.86 kg/m     Physical Exam Vitals reviewed.  Constitutional:      Appearance: Normal appearance. He is well-developed.  HENT:     Head: Normocephalic and atraumatic.  Cardiovascular:     Rate and Rhythm: Normal rate and regular rhythm.     Pulses: Normal pulses.          Radial pulses are 2+ on the right side and 2+ on the left side.     Heart sounds: Normal heart sounds. No murmur heard.    No friction rub. No gallop.  Pulmonary:     Effort: Pulmonary effort is normal.     Breath sounds: Normal breath sounds. No decreased air movement. No decreased breath sounds, wheezing, rhonchi or rales.  Musculoskeletal:     Right lower leg: No edema.     Left lower leg: No edema.  Neurological:     General: No focal deficit present.     Mental Status: He is alert and oriented to person, place, and time.     Cranial Nerves: No cranial nerve deficit, dysarthria or facial asymmetry.     Motor: No weakness, tremor or atrophy.  Psychiatric:        Mood and Affect: Mood normal.        Behavior: Behavior normal.        Thought Content: Thought content normal.        Judgment: Judgment normal.         EKG performed today in office EKG demonstrates sinus bradycardia with normal rhythm, rate of ~55 bpm Today's EKG was compared to previous EKG from 05/27/23 and there are no significant changes other than rate    No results found for any visits on  08/22/23.  Assessment & Plan      No follow-ups on file.      Problem List Items Addressed This Visit   None Visit Diagnoses     Chest pain, unspecified type    -  Primary Acute, intermittent and episodic in nature EKG was normal in office today Recheck labs  Physical exam was reassuring today  Unsure if chest pain is secondary to ongoing back pain - he has upcoming PT apt for management If chest pain is persistent may need long term monitor and Cardiology referral for further work up Follow up as needed for persistent or progressing symptoms      Relevant Orders   EKG 12-Lead   Dizziness     Acute, recurrent He reports this has been happening with chest pain Unsure if this is related to back pain or other underlying issue Will recheck labs- CMP, CBC, B12, TSH, Vitamin D for rule out Recommend staying well hydrated and eating regularly to avoid orthostatic hypotension, hypoglycemia Reviewed ED and return precautions Follow up as needed for persistent or progressing symptoms     Relevant Orders   COMPLETE METABOLIC PANEL WITH GFR   CBC w/Diff/Platelet   TSH   T4   B12   Vitamin D (25 hydroxy)        No follow-ups on file.   I, Trice Aspinall E Micah Barnier, PA-C, have reviewed all documentation for this visit. The documentation on 08/22/23 for the exam, diagnosis, procedures, and orders are all accurate and complete.   Jacquelin Hawking, MHS, PA-C [facility]

## 2023-08-22 NOTE — Telephone Encounter (Signed)
I have an open appointment tomorrow.  Is he able to be scheduled with me instead of Erin?

## 2023-08-23 DIAGNOSIS — R293 Abnormal posture: Secondary | ICD-10-CM | POA: Diagnosis not present

## 2023-08-23 DIAGNOSIS — M546 Pain in thoracic spine: Secondary | ICD-10-CM | POA: Diagnosis not present

## 2023-08-23 LAB — CBC WITH DIFFERENTIAL/PLATELET
Absolute Lymphocytes: 1686 {cells}/uL (ref 850–3900)
Absolute Monocytes: 595 {cells}/uL (ref 200–950)
Basophils Absolute: 43 {cells}/uL (ref 0–200)
Basophils Relative: 0.7 %
Eosinophils Absolute: 254 {cells}/uL (ref 15–500)
Eosinophils Relative: 4.1 %
HCT: 40.6 % (ref 38.5–50.0)
Hemoglobin: 13.2 g/dL (ref 13.2–17.1)
MCH: 27.2 pg (ref 27.0–33.0)
MCHC: 32.5 g/dL (ref 32.0–36.0)
MCV: 83.5 fL (ref 80.0–100.0)
MPV: 12.1 fL (ref 7.5–12.5)
Monocytes Relative: 9.6 %
Neutro Abs: 3621 {cells}/uL (ref 1500–7800)
Neutrophils Relative %: 58.4 %
Platelets: 256 10*3/uL (ref 140–400)
RBC: 4.86 10*6/uL (ref 4.20–5.80)
RDW: 13.1 % (ref 11.0–15.0)
Total Lymphocyte: 27.2 %
WBC: 6.2 10*3/uL (ref 3.8–10.8)

## 2023-08-23 LAB — COMPLETE METABOLIC PANEL WITH GFR
AG Ratio: 2.4 (calc) (ref 1.0–2.5)
ALT: 21 U/L (ref 9–46)
AST: 20 U/L (ref 10–40)
Albumin: 5.1 g/dL (ref 3.6–5.1)
Alkaline phosphatase (APISO): 38 U/L (ref 36–130)
BUN/Creatinine Ratio: 25 (calc) — ABNORMAL HIGH (ref 6–22)
BUN: 26 mg/dL — ABNORMAL HIGH (ref 7–25)
CO2: 28 mmol/L (ref 20–32)
Calcium: 9.4 mg/dL (ref 8.6–10.3)
Chloride: 102 mmol/L (ref 98–110)
Creat: 1.04 mg/dL (ref 0.60–1.26)
Globulin: 2.1 g/dL (ref 1.9–3.7)
Glucose, Bld: 87 mg/dL (ref 65–99)
Potassium: 4.3 mmol/L (ref 3.5–5.3)
Sodium: 138 mmol/L (ref 135–146)
Total Bilirubin: 0.5 mg/dL (ref 0.2–1.2)
Total Protein: 7.2 g/dL (ref 6.1–8.1)
eGFR: 98 mL/min/{1.73_m2} (ref >=60)

## 2023-08-23 LAB — T4: T4, Total: 7.2 ug/dL (ref 4.9–10.5)

## 2023-08-23 LAB — TSH: TSH: 1.56 m[IU]/L (ref 0.40–4.50)

## 2023-08-23 LAB — VITAMIN B12: Vitamin B-12: 368 pg/mL (ref 200–1100)

## 2023-08-23 LAB — VITAMIN D 25 HYDROXY (VIT D DEFICIENCY, FRACTURES): Vit D, 25-Hydroxy: 39 ng/mL (ref 30–100)

## 2023-08-23 NOTE — Progress Notes (Signed)
Your labs are back Your electrolytes, liver and kidney function appear to be in normal ranges Your CBC is overall normal, no signs of anemia Your thyroid testing was normal Your vitamin D was in normal range but was on the lower end.  If you would like you can start supplementing with 600 to 800 units/day and have this rechecked in about 3 months Your B12 is in normal ranges as well At this time I am unsure what is causing your dizziness.  If it persists we can try sending you for vestibular rehab.  This is a kind of physical therapy that helps with dizziness caused by a problems with the inner ear.  Let us know if your symptoms are not improving after you start physical therapy for your back

## 2023-08-26 DIAGNOSIS — M546 Pain in thoracic spine: Secondary | ICD-10-CM | POA: Diagnosis not present

## 2023-08-26 DIAGNOSIS — R293 Abnormal posture: Secondary | ICD-10-CM | POA: Diagnosis not present

## 2023-08-28 ENCOUNTER — Ambulatory Visit (INDEPENDENT_AMBULATORY_CARE_PROVIDER_SITE_OTHER): Payer: BC Managed Care – PPO | Admitting: Internal Medicine

## 2023-08-28 ENCOUNTER — Encounter: Payer: Self-pay | Admitting: Internal Medicine

## 2023-08-28 VITALS — BP 126/76 | HR 61 | Temp 98.0°F | Ht 65.0 in | Wt 235.0 lb

## 2023-08-28 DIAGNOSIS — J4520 Mild intermittent asthma, uncomplicated: Secondary | ICD-10-CM

## 2023-08-28 DIAGNOSIS — G4719 Other hypersomnia: Secondary | ICD-10-CM

## 2023-08-28 NOTE — Progress Notes (Signed)
Name: Miguel Perez. MRN: [id number] DOB: [birth year]    CHIEF COMPLAINT:  EXCESSIVE DAYTIME SLEEPINESS   HISTORY OF PRESENT ILLNESS: Patient is seen today for problems and issues with sleep related to excessive daytime sleepiness Patient  has been having sleep problems for many years Patient has been having excessive daytime sleepiness for a long time Patient has been having extreme fatigue and tiredness, lack of energy +  very Loud snoring every night + struggling breathe at night and gasps for air + morning headaches + Nonrefreshing sleep   Encouraged proper weight management.  Discussed driving precautions and its relationship with hypersomnolence.  Discussed operating dangerous equipment and its relationship with hypersomnolence.  Discussed sleep hygiene, and benefits of a fixed sleep waked time.  The importance of getting eight or more hours of sleep discussed with patient.  Discussed limiting the use of the computer and television before bedtime.  Decrease naps during the day, so night time sleep will become enhanced.  Limit caffeine, and sleep deprivation.  HTN, stroke, and heart failure are potential risk factors.    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  has a past surgical history that includes Hernia repair. Prior to Admission medications   Medication Sig Start Date End Date Taking? Authorizing Provider  albuterol (VENTOLIN HFA) 108 (90 Base) MCG/ACT inhaler INHALE 1 TO 2 PUFFS BY MOUTH EVERY 6 HOURS AS NEEDED FOR WHEEZING FOR  SHORTNESS OF BREATH 03/06/23   Lorre Munroe, NP  amLODIPine-Valsartan-HCTZ [phone] MG TABS Take 1 tablet by mouth daily. 03/06/23   Lorre Munroe, NP  atorvastatin (LIPITOR) 20 MG tablet Take 1 tablet (20 mg total) by mouth daily. 03/06/23   Lorre Munroe, NP  Blood Glucose Monitoring Suppl DEVI Used to check blood sugar once a day. DX: E11.9   May substitute to any manufacturer covered by patient's insurance. 08/08/23   Lorre Munroe, NP  fenofibrate (TRICOR) 145 MG tablet Take 1 tablet (145 mg total) by mouth daily. 03/11/23   Lorre Munroe, NP  fluticasone (FLONASE) 50 MCG/ACT nasal spray Place into both nostrils daily.    [provider]  fluticasone-salmeterol (ADVAIR) 100-50 MCG/ACT AEPB Inhale 1 puff into the lungs 2 (two) times daily. 03/06/23   Lorre Munroe, NP  Glucose Blood (BLOOD GLUCOSE TEST STRIPS) STRP Used to check blood sugar once a day. DX: E11.9   May substitute to any manufacturer covered by patient's insurance. 08/08/23   Lorre Munroe, NP  labetalol (NORMODYNE) 100 MG tablet Take 1 tablet (100 mg total) by mouth 2 (two) times daily. 03/06/23   Lorre Munroe, NP  Lancet Device MISC Used to check blood sugar once a day. DX: E11.9   May substitute to any manufacturer covered by patient's insurance. 08/08/23   Lorre Munroe, NP  Lancets Misc. MISC Used to check blood sugar once a day. DX: E11.9   May substitute to any manufacturer covered by patient's insurance. 08/08/23   Lorre Munroe, NP  methocarbamol (ROBAXIN) 500  MG tablet Take 1 tablet (500 mg total) by mouth every 8 (eight) hours as needed for muscle spasms. 05/06/23   Lorre Munroe, NP  omeprazole (PRILOSEC) 20 MG capsule Take 1 capsule (20 mg total) by mouth daily. 05/06/23   Lorre Munroe, NP   No Known Allergies  FAMILY HISTORY:  family history includes Hypertension in his mother. SOCIAL HISTORY:  reports that he has quit smoking. His smoking use included cigarettes. He has a 11 pack-year smoking  history. He has never used smokeless tobacco. He reports current alcohol use of about 8.0 standard drinks of alcohol per week. He reports that he does not use drugs.   Review of Systems:  Gen:  Denies  fever, sweats, chills weight loss  HEENT: Denies blurred vision, double vision, ear pain, eye pain, hearing loss, nose bleeds, sore throat Cardiac:  No dizziness, chest pain or heaviness, chest tightness,edema, No JVD Resp:   No cough, -sputum production, -shortness of breath,-wheezing, -hemoptysis,  Gi: Denies swallowing difficulty, stomach pain, nausea or vomiting, diarrhea, constipation, bowel incontinence Gu:  Denies bladder incontinence, burning urine Ext:   Denies Joint pain, stiffness or swelling Skin: Denies  skin rash, easy bruising or bleeding or hives Endoc:  Denies polyuria, polydipsia , polyphagia or weight change Psych:   Denies depression, insomnia or hallucinations  Other:  All other systems negative   ALL OTHER ROS ARE NEGATIVE  BP 126/76 (BP Location: Right Arm, Patient Position: Sitting, Cuff Size: Normal)   Pulse 61   Temp 98 F (36.7 C) (Temporal)   Ht 5\' 5"  (1.651 m)   Wt 235 lb (106.6 kg)   SpO2 96%   BMI 39.11 kg/m     Physical Examination:   General Appearance: No distress  EYES PERRLA, EOM intact.   NECK Supple, No JVD Pulmonary: normal breath sounds, No wheezing.  CardiovascularNormal S1,S2.  No m/r/g.   Abdomen: Benign, Soft, non-tender. Skin:   warm, no rashes, no ecchymosis  Extremities: normal, no cyanosis, clubbing. Neuro:without focal findings,  speech normal  PSYCHIATRIC: Mood, affect within normal limits.   ALL OTHER ROS ARE NEGATIVE    ASSESSMENT AND PLAN SYNOPSIS  Patient with signs and symptoms of excessive daytime sleepiness with probable underlying diagnosis of obstructive sleep apnea in the setting of obesity and deconditioned state   Recommend Sleep Study for definitve diagnosis  Asthma long-term Well-controlled with  Advair Uses albuterol infrequently Avoid secondhand smoke Avoid SICK contacts Recommend  Masking  when appropriate Recommend Keep up-to-date with vaccinations  Obesity -recommend significant weight loss -recommend changing diet  Deconditioned state -Recommend increased daily activity and exercise   MEDICATION ADJUSTMENTS/LABS AND TESTS ORDERED: Recommend Sleep Study Recommend weight loss  Avoid secondhand smoke Avoid SICK contacts Recommend  Masking  when appropriate Recommend Keep up-to-date with vaccinations  CURRENT MEDICATIONS REVIEWED AT LENGTH WITH PATIENT TODAY   Patient  satisfied with Plan of action and management. All questions answered  Follow up  3 months  Total Time Spent  35 mins   Wallis Bamberg Santiago Glad, M.D.  [facility]  Medical Director [facility] Medical Director [facility] Cardio-Pulmonary Department

## 2023-08-28 NOTE — Patient Instructions (Addendum)
Obtain home sleep study for definitive diagnosis Recommend weight loss  Avoid secondhand smoke Avoid SICK contacts Recommend  Masking  when appropriate Recommend Keep up-to-date with vaccinations

## 2023-09-05 DIAGNOSIS — M546 Pain in thoracic spine: Secondary | ICD-10-CM | POA: Diagnosis not present

## 2023-09-05 DIAGNOSIS — R293 Abnormal posture: Secondary | ICD-10-CM | POA: Diagnosis not present

## 2023-09-06 DIAGNOSIS — M546 Pain in thoracic spine: Secondary | ICD-10-CM | POA: Diagnosis not present

## 2023-09-08 ENCOUNTER — Other Ambulatory Visit: Payer: Self-pay | Admitting: Internal Medicine

## 2023-09-09 DIAGNOSIS — M546 Pain in thoracic spine: Secondary | ICD-10-CM | POA: Diagnosis not present

## 2023-09-09 DIAGNOSIS — R293 Abnormal posture: Secondary | ICD-10-CM | POA: Diagnosis not present

## 2023-09-09 NOTE — Telephone Encounter (Signed)
Requested Prescriptions  Pending Prescriptions Disp Refills   fenofibrate (TRICOR) 145 MG tablet [Pharmacy Med Name: Fenofibrate 145 MG Oral Tablet] 90 tablet 1    Sig: Take 1 tablet by mouth once daily     Cardiovascular:  Antilipid - Fibric Acid Derivatives Failed - 09/08/2023  6:44 AM      Failed - Lipid Panel in normal range within the last 12 months    Cholesterol  Date Value Ref Range Status  03/06/2023 169 <200 mg/dL Final   LDL Cholesterol (Calc)  Date Value Ref Range Status  03/06/2023 76 mg/dL (calc) Final    Comment:    Reference range: <100 . Desirable range <100 mg/dL for primary prevention;   <70 mg/dL for patients with CHD or diabetic patients  with > or = 2 CHD risk factors. Marland Kitchen LDL-C is now calculated using the Martin-Hopkins  calculation, which is a validated novel method providing  better accuracy than the Friedewald equation in the  estimation of LDL-C.  Horald Pollen et al. Lenox Ahr. 1610;960(45): 2061-2068  (http://education.[employer].com/faq/FAQ164)    HDL  Date Value Ref Range Status  03/06/2023 49 > OR = 40 mg/dL Final   Triglycerides  Date Value Ref Range Status  03/06/2023 362 (H) <150 mg/dL Final    Comment:    . If a non-fasting specimen was collected, consider repeat triglyceride testing on a fasting specimen if clinically indicated.  [place] et al. J. of Clin. Lipidol. 2015;9:129-169. Marland Kitchen          Passed - ALT in normal range and within 360 days    ALT  Date Value Ref Range Status  08/22/2023 21 9 - 46 U/L Final         Passed - AST in normal range and within 360 days    AST  Date Value Ref Range Status  08/22/2023 20 10 - 40 U/L Final         Passed - Cr in normal range and within 360 days    Creat  Date Value Ref Range Status  08/22/2023 1.04 0.60 - 1.26 mg/dL Final   Creatinine, Urine  Date Value Ref Range Status  03/06/2023 113 20 - 320 mg/dL Final         Passed - HGB in normal range and within 360 days     Hemoglobin  Date Value Ref Range Status  08/22/2023 13.2 13.2 - 17.1 g/dL Final         Passed - HCT in normal range and within 360 days    HCT  Date Value Ref Range Status  08/22/2023 40.6 38.5 - 50.0 % Final         Passed - PLT in normal range and within 360 days    Platelets  Date Value Ref Range Status  08/22/2023 256 140 - 400 Thousand/uL Final         Passed - WBC in normal range and within 360 days    WBC  Date Value Ref Range Status  08/22/2023 6.2 3.8 - 10.8 Thousand/uL Final         Passed - eGFR is 30 or above and within 360 days    GFR, Est African American  Date Value Ref Range Status  05/23/2020 115 > OR = 60 mL/min/1.13m2 Final   GFR calc Af Amer  Date Value Ref Range Status  07/01/2020 >60 >60 mL/min Final   GFR, Est Non African American  Date Value Ref Range Status  05/23/2020 100 > OR =  60 mL/min/1.47m2 Final   GFR, Estimated  Date Value Ref Range Status  05/23/2023 >60 >60 mL/min Final    Comment:    (NOTE) Calculated using the CKD-EPI Creatinine Equation (2021)    eGFR  Date Value Ref Range Status  08/22/2023 98 > OR = 60 mL/min/1.65m2 Final         Passed - Valid encounter within last 12 months    Recent Outpatient Visits           2 weeks ago Chest pain, unspecified type   [place] [facility] [place], Oswaldo Conroy, PA-C   1 month ago [place]   [place] [facility] [place], [place] W, NP   2 months ago Chronic left-sided thoracic back pain   [place] [facility] [place], Salvadore Oxford, NP   4 months ago Primary hypertension   [place] [facility] [place], [place] W, NP   6 months ago Chronic midline thoracic back pain   [place] [facility] [place], Salvadore Oxford, NP       Future Appointments             In 2 months Erin Fulling, MD [facility] at [place]   In 4 months Baity, Salvadore Oxford, NP [place] [facility], [facility]

## 2023-09-12 DIAGNOSIS — G4730 Sleep apnea, unspecified: Secondary | ICD-10-CM | POA: Diagnosis not present

## 2023-09-14 ENCOUNTER — Other Ambulatory Visit: Payer: Self-pay | Admitting: Internal Medicine

## 2023-09-16 DIAGNOSIS — M546 Pain in thoracic spine: Secondary | ICD-10-CM | POA: Diagnosis not present

## 2023-09-16 DIAGNOSIS — R293 Abnormal posture: Secondary | ICD-10-CM | POA: Diagnosis not present

## 2023-09-16 NOTE — Telephone Encounter (Signed)
Requested Prescriptions  Pending Prescriptions Disp Refills   amLODIPine-Valsartan-HCTZ 10-320-25 MG TABS [Pharmacy Med Name: amLODIPine-Valsartan-HCTZ 10-320-25 MG Oral Tablet] 90 tablet 1    Sig: Take 1 tablet by mouth once daily     Cardiovascular: CCB + ARB + Diuretic Combos Passed - 09/14/2023  1:01 PM      Passed - K in normal range and within 180 days    Potassium  Date Value Ref Range Status  08/22/2023 4.3 3.5 - 5.3 mmol/L Final         Passed - Na in normal range and within 180 days    Sodium  Date Value Ref Range Status  08/22/2023 138 135 - 146 mmol/L Final         Passed - Cr in normal range and within 180 days    Creat  Date Value Ref Range Status  08/22/2023 1.04 0.60 - 1.26 mg/dL Final   Creatinine, Urine  Date Value Ref Range Status  03/06/2023 113 20 - 320 mg/dL Final         Passed - eGFR is 10 or above and within 180 days    GFR, Est African American  Date Value Ref Range Status  05/23/2020 115 > OR = 60 mL/min/1.82m2 Final   GFR calc Af Amer  Date Value Ref Range Status  07/01/2020 >60 >60 mL/min Final   GFR, Est Non African American  Date Value Ref Range Status  05/23/2020 100 > OR = 60 mL/min/1.42m2 Final   GFR, Estimated  Date Value Ref Range Status  05/23/2023 >60 >60 mL/min Final    Comment:    (NOTE) Calculated using the CKD-EPI Creatinine Equation (2021)    eGFR  Date Value Ref Range Status  08/22/2023 98 > OR = 60 mL/min/1.22m2 Final         Passed - Patient is not pregnant      Passed - Last BP in normal range    BP Readings from Last 1 Encounters:  08/28/23 126/76         Passed - Last Heart Rate in normal range    Pulse Readings from Last 1 Encounters:  08/28/23 61         Passed - Valid encounter within last 6 months    Recent Outpatient Visits           3 weeks ago Chest pain, unspecified type   [facility], Oswaldo Conroy, PA-C   1 month ago [place]   [place] [facility] [place], Salvadore Oxford, NP   2 months ago Chronic left-sided thoracic back pain   [place] [facility] [place], Salvadore Oxford, NP   4 months ago Primary hypertension   [place] [facility] [place], Salvadore Oxford, NP   6 months ago Chronic midline thoracic back pain   [place] [facility] [place], Salvadore Oxford, NP       Future Appointments             In 2 months Erin Fulling, MD [facility] at [place]   In 4 months Baity, Salvadore Oxford, NP [place] [facility], [facility]

## 2023-10-09 DIAGNOSIS — M546 Pain in thoracic spine: Secondary | ICD-10-CM | POA: Diagnosis not present

## 2023-10-17 ENCOUNTER — Telehealth: Payer: Self-pay | Admitting: Internal Medicine

## 2023-10-17 DIAGNOSIS — G4733 Obstructive sleep apnea (adult) (pediatric): Secondary | ICD-10-CM

## 2023-10-17 NOTE — Telephone Encounter (Signed)
Pt calling for sleep study results  

## 2023-10-18 DIAGNOSIS — M546 Pain in thoracic spine: Secondary | ICD-10-CM | POA: Diagnosis not present

## 2023-10-21 NOTE — Telephone Encounter (Signed)
Synetta Fail can you check on his HST results? They are not in his chart.

## 2023-10-22 NOTE — Telephone Encounter (Signed)
The sleep study was done on 09/12/23 and it has been read but not scanned into Epic yet

## 2023-10-24 ENCOUNTER — Encounter: Payer: Self-pay | Admitting: Internal Medicine

## 2023-10-25 NOTE — Telephone Encounter (Signed)
Sleep study has been scanned in.  Dr. Belia Heman is out of the office. Dr. Jayme Cloud can you advise.

## 2023-10-25 NOTE — Telephone Encounter (Signed)
Lm x1 for the patient.

## 2023-10-25 NOTE — Telephone Encounter (Signed)
He has severe sleep apnea but also has central as well as obstructive component and low oxygen's during sleep.  This will require an in lab titration to determine best equipment for him.  Please schedule for an in lab titration study comment: Titrate to BiPAP/AVAPS as necessary.  Lease make sure that Dr. Belia Heman gets copy of that report.

## 2023-10-28 NOTE — Telephone Encounter (Signed)
Pt is aware of results/recommendations and voiced his understanding. Cpap/bipap titration has been ordered. Nothing further needed.

## 2023-11-01 DIAGNOSIS — M25561 Pain in right knee: Secondary | ICD-10-CM | POA: Diagnosis not present

## 2023-11-04 ENCOUNTER — Encounter: Payer: BC Managed Care – PPO | Admitting: Internal Medicine

## 2023-11-08 ENCOUNTER — Ambulatory Visit: Payer: BC Managed Care – PPO | Admitting: Internal Medicine

## 2023-11-08 VITALS — BP 122/76 | Ht 65.0 in | Wt 227.6 lb

## 2023-11-08 DIAGNOSIS — M25561 Pain in right knee: Secondary | ICD-10-CM

## 2023-11-08 DIAGNOSIS — M25551 Pain in right hip: Secondary | ICD-10-CM

## 2023-11-08 DIAGNOSIS — M79651 Pain in right thigh: Secondary | ICD-10-CM

## 2023-11-08 MED ORDER — NAPROXEN 500 MG PO TABS: 500.0000 mg | ORAL_TABLET | Freq: Two times a day (BID) | ORAL | 0 refills | Status: DC

## 2023-11-08 NOTE — Progress Notes (Signed)
 Subjective:    Patient ID: Miguel Perez., male    DOB: June 01, 1991, 33 y.o.   MRN: [id number]  HPI    Discussed the use of AI scribe software for clinical note transcription with the patient, who gave verbal consent to proceed.  The patient, with a history of chronic thoracic back pain, presents with a complaint of right knee pain extending up the thigh into the hip area. The pain began approximately a month ago, following an incident where the patient fell asleep on the floor. The pain has been consistent since the onset and has worsened over the last week and a half.  The patient describes the pain as a feeling of weakness, like his leg is going to give out. When weight is put on the leg, the patient experiences a sharp pain, primarily concentrated in the knee. There is no associated numbness or tingling in the lower part of the leg.  The patient sought care at an urgent care center, where he was prescribed prednisone  for a few days. The prednisone  provided temporary relief, but the pain returned shortly after the course was completed. The patient has also been taking Aleve  and wearing a knee brace, which provides minimal relief.  The patient reports difficulty with mobility, particularly when walking up and down stairs. He has to lift the affected foot first and then step up. Despite these symptoms, there is no reported swelling in the leg.  The patient has a history of receiving physical therapy for his back pain at an orthopedic clinic. He is scheduled to receive shots for his back pain at the end of January.      Review of Systems     Past Medical History:  Diagnosis Date   Asthma     Current Outpatient Medications  Medication Sig Dispense Refill   albuterol  (VENTOLIN  HFA) 108 (90 Base) MCG/ACT inhaler INHALE 1 TO 2 PUFFS BY MOUTH EVERY 6 HOURS AS NEEDED FOR WHEEZING FOR SHORTNESS OF BREATH 18 g 2   amLODIPine -Valsartan -HCTZ 10-320-25 MG TABS Take 1 tablet by mouth once  daily 90 tablet 1   atorvastatin  (LIPITOR) 20 MG tablet Take 1 tablet (20 mg total) by mouth daily. 90 tablet 1   Blood Glucose Monitoring Suppl DEVI Used to check blood sugar once a day. DX: E11.9   May substitute to any manufacturer covered by patient's insurance. 1 each 0   fenofibrate  (TRICOR ) 145 MG tablet Take 1 tablet by mouth once daily 90 tablet 1   fluticasone  (FLONASE ) 50 MCG/ACT nasal spray Place into both nostrils daily.     fluticasone -salmeterol (ADVAIR) 100-50 MCG/ACT AEPB Inhale 1 puff into the lungs 2 (two) times daily. 180 each 1   Glucose Blood (BLOOD GLUCOSE TEST STRIPS) STRP Used to check blood sugar once a day. DX: E11.9   May substitute to any manufacturer covered by patient's insurance. 100 strip 1   labetalol  (NORMODYNE ) 100 MG tablet Take 1 tablet (100 mg total) by mouth 2 (two) times daily. 180 tablet 1   Lancet Device MISC Used to check blood sugar once a day. DX: E11.9   May substitute to any manufacturer covered by patient's insurance. 1 each 0   methocarbamol  (ROBAXIN ) 500 MG tablet Take 1 tablet (500 mg total) by mouth every 8 (eight) hours as needed for muscle spasms. 60 tablet 0   omeprazole  (PRILOSEC) 20 MG capsule Take 1 capsule (20 mg total) by mouth daily. 90 capsule 1   No current facility-administered  medications for this visit.    No Known Allergies  Family History  Problem Relation Age of Onset   Hypertension Mother     Social History   Socioeconomic History   Marital status: Single    Spouse name: Not on file   Number of children: Not on file   Years of education: Not on file   Highest education level: Not on file  Occupational History   Not on file  Tobacco Use   Smoking status: Former    Current packs/day: 1.00    Average packs/day: 1 pack/day for 11.0 years (11.0 ttl pk-yrs)    Types: Cigarettes   Smokeless tobacco: Never  Vaping Use   Vaping status: Every Day  Substance and Sexual Activity   Alcohol use: Yes    Alcohol/week:  8.0 standard drinks of alcohol    Types: 8 Standard drinks or equivalent per week    Comment: 8 standard on a weekly basis    Drug use: No   Sexual activity: Yes  Other Topics Concern   Not on file  Social History Narrative   Not on file   Social Drivers of Health   Financial Resource Strain: Not on file  Food Insecurity: Not on file  Transportation Needs: Not on file  Physical Activity: Not on file  Stress: Not on file  Social Connections: Not on file  Intimate Partner Violence: Not on file     Constitutional: Denies fever, malaise, fatigue, headache or abrupt weight changes.  Respiratory: Denies difficulty breathing, shortness of breath, cough or sputum production.   Cardiovascular: Denies chest pain, chest tightness, palpitations or swelling in the hands or feet.  Musculoskeletal: Patient reports chronic thoracic back pain, right knee pain, right hip pain, right thigh pain.  Denies decrease in range of motion, difficulty with gait, or joint swelling.  Skin: Denies redness, rashes, lesions or ulcercations.  Neurological: Pt reports right leg weakness. Denies numbness, tingling  or problems with balance and coordination.    No other specific complaints in a complete review of systems (except as listed in HPI above).  Objective:   Physical Exam   BP 122/76 (BP Location: Right Arm, Patient Position: Sitting, Cuff Size: Large)   Ht 5' 5 (1.651 m)   Wt 227 lb 9.6 oz (103.2 kg)   BMI 37.87 kg/m    Wt Readings from Last 3 Encounters:  08/28/23 235 lb (106.6 kg)  08/22/23 233 lb 8 oz (105.9 kg)  08/08/23 235 lb (106.6 kg)    General: Appears his stated age, obese, in NAD. Skin: Warm, dry and intact. No ulcerations noted. Cardiovascular: Normal rate and rhythm.  Pulmonary/Chest: Normal effort and positive vesicular breath sounds. No respiratory distress. No wheezes, rales or ronchi noted.  Musculoskeletal: Normal flexion, extension, rotation and lateral bending of the  spine but pain with lateral bending to the right.  Pain with palpation over the thoracic spine.  No bony tenderness noted over the lumbar spine or right SI joint.  Decreased abduction, adduction, internal and external rotation of the right hip.  No pain with palpation of the right hip.  Pain with palpation along the right IT band.  Normal flexion extension of the right knee but crepitus noted with range of motion.  No joint swelling noted.  Pain with palpation over the right lateral knee.  Strength 5/5 LLE, strength 4/5 with resistance to hip flexion RLE.  Able to stand on tiptoes and heels but difficult.  No difficulty with gait.  Neurological: Alert and oriented.  Sensation intact to RLE.  BMET    Component Value Date/Time   NA 138 08/22/2023 1506   K 4.3 08/22/2023 1506   CL 102 08/22/2023 1506   CO2 28 08/22/2023 1506   GLUCOSE 87 08/22/2023 1506   BUN 26 (H) 08/22/2023 1506   CREATININE 1.04 08/22/2023 1506   CALCIUM  9.4 08/22/2023 1506   GFRNONAA >60 05/23/2023 2133   GFRNONAA 100 05/23/2020 1533   GFRAA >60 07/01/2020 1104   GFRAA 115 05/23/2020 1533    Lipid Panel     Component Value Date/Time   CHOL 169 03/06/2023 1456   TRIG 362 (H) 03/06/2023 1456   HDL 49 03/06/2023 1456   CHOLHDL 3.4 03/06/2023 1456   LDLCALC 76 03/06/2023 1456    CBC    Component Value Date/Time   WBC 6.2 08/22/2023 1506   RBC 4.86 08/22/2023 1506   HGB 13.2 08/22/2023 1506   HCT 40.6 08/22/2023 1506   PLT 256 08/22/2023 1506   MCV 83.5 08/22/2023 1506   MCH 27.2 08/22/2023 1506   MCHC 32.5 08/22/2023 1506   RDW 13.1 08/22/2023 1506   LYMPHSABS 1.5 07/01/2020 1104   MONOABS 0.5 07/01/2020 1104   EOSABS 254 08/22/2023 1506   BASOSABS 43 08/22/2023 1506    Hgb A1C Lab Results  Component Value Date   HGBA1C 6.2 (H) 08/08/2023           Assessment & Plan:    Assessment and Plan    Right Knee Pain, Right Hip Pain, Right Thigh Pain Pain and weakness in the right knee for  approximately one month, exacerbated by weight bearing. Possible meniscal injury or IT band syndrome. Prednisone  provided temporary relief. -Start Naproxen  500mg  twice daily with food and water. -Discontinue over-the-counter Aleve . -Refer to orthopedist for further evaluation and imaging.  Chronic Thoracic Back Pain Ongoing management with planned injection therapy. -Continue current management plan. -Follow-up after injection therapy on December 05, 2023.       RTC in 3 months for your annual exam Angeline Laura, NP

## 2023-11-08 NOTE — Patient Instructions (Signed)
Hip Exercises Ask your health care provider which exercises are safe for you. Do exercises exactly as told by your provider and adjust them as told. It is normal to feel mild stretching, pulling, tightness, or discomfort as you do these exercises. Stop right away if you feel sudden pain or your pain gets worse. Do not begin these exercises until told by your provider. Stretching and range-of-motion exercises These exercises warm up your muscles and joints and improve the movement and flexibility of your hip. They also help to relieve pain, numbness, and tingling. You may be asked to limit your range of motion if you had a hip replacement. Talk to your provider about these limits. Hamstrings, supine  Lie on your back (supine position). Loop a belt, towel, or exercise band over the ball of your left / right foot. The ball of your foot is on the walking surface, right under your toes. Straighten your left / right knee and slowly pull on the belt, towel, or band to raise your leg until you feel a gentle stretch behind your knee (hamstring). Do not let your knee bend while you do this. Keep your other leg flat on the floor. Hold this position for __________ seconds. Slowly return your leg to the starting position. Repeat __________ times. Complete this exercise __________ times a day. Hip rotation  Lie on your back on a firm surface. With your left / right hand, gently pull your left / right knee toward the shoulder that is on the same side of the body. Stop when your knee is pointing toward the ceiling. Hold your left / right ankle with your other hand. Keeping your knee steady, gently pull your left / right ankle toward your other shoulder until you feel a stretch in your butt. Keep your hips and shoulders firmly planted while you do this stretch. Hold this position for __________ seconds. Repeat __________ times. Complete this exercise __________ times a day. Seated stretch This exercise is  sometimes called hamstrings and adductors stretch. Sit on the floor with your legs stretched wide. Keep your knees straight during this exercise. Keeping your head and back in a straight line, bend at your waist to reach for your left foot (position A). You should feel a stretch in your right inner thigh (adductors). Hold this position for __________ seconds. Then slowly return to the upright position. Keeping your head and back in a straight line, bend at your waist to reach forward (position B). You should feel a stretch behind both of your thighs and knees (hamstrings). Hold this position for __________ seconds. Then slowly return to the upright position. Keeping your head and back in a straight line, bend at your waist to reach for your right foot (position C). You should feel a stretch in your left inner thigh (adductors). Hold this position for __________ seconds. Then slowly return to the upright position. Repeat __________ times. Complete this exercise __________ times a day. Lunge This exercise stretches the muscles of the hip (hip flexors). Place your left / right knee on the floor and bend your other knee so that is directly over your ankle. You should be half-kneeling. Keep good posture with your head over your shoulders. Tighten your butt muscles to point your tailbone downward. This will prevent your back from arching too much. You should feel a gentle stretch in the front of your left / right thigh and hip. If you do not feel a stretch, slide your other foot forward slightly and  then slowly lunge forward with your chest up until your knee once again lines up over your ankle. Make sure your tailbone continues to point downward. Hold this position for __________ seconds. Slowly return to the starting position. Repeat __________ times. Complete this exercise __________ times a day. Strengthening exercises These exercises build strength and endurance in your hip. Endurance is the  ability to use your muscles for a long time, even after they get tired. Bridge This exercise strengthens the muscles of your hip (hip extensors). Lie on your back on a firm surface with your knees bent and your feet flat on the floor. Tighten your butt muscles and lift your bottom off the floor until the trunk of your body and your hips are level with your thighs. Do not arch your back. You should feel the muscles working in your butt and the back of your thighs. If you do not feel these muscles, slide your feet 1-2 inches (2.5-5 cm) farther away from your butt. Hold this position for __________ seconds. Slowly lower your hips to the starting position. Let your muscles relax completely between repetitions. Repeat __________ times. Complete this exercise __________ times a day. Straight leg raises, side-lying This exercise strengthens the muscles that move the hip joint away from the center of the body (hip abductors). Lie on your side with your left / right leg in the top position. Lie so your head, shoulder, hip, and knee line up. You may bend your bottom knee slightly to help you balance. Roll your hips slightly forward, so your hips are stacked directly over each other and your left / right knee is facing forward. Leading with your heel, lift your top leg 4-6 inches (10-15 cm). You should feel the muscles in your top hip lifting. Do not let your foot drift forward. Do not let your knee roll toward the ceiling. Hold this position for __________ seconds. Slowly return to the starting position. Let your muscles relax completely between repetitions. Repeat __________ times. Complete this exercise __________ times a day. Straight leg raises, side-lying This exercise strengthens the muscles that move the hip joint toward the center of the body (hip adductors). Lie on your side with your left / right leg in the bottom position. Lie so your head, shoulder, hip, and knee line up. You may place your  upper foot in front to help you balance. Roll your hips slightly forward, so your hips are stacked directly over each other and your left / right knee is facing forward. Tense the muscles in your inner thigh and lift your bottom leg 4-6 inches (10-15 cm). Hold this position for __________ seconds. Slowly return to the starting position. Let your muscles relax completely between repetitions. Repeat __________ times. Complete this exercise __________ times a day. Straight leg raises, supine This exercise strengthens the muscles in the front of your thigh (quadriceps and hip flexors). Lie on your back (supine position) with your left / right leg extended and your other knee bent. Tense the muscles in the front of your left / right thigh. You should see your kneecap slide up or see increased dimpling just above your knee. Keep these muscles tight as you raise your leg 4-6 inches (10-15 cm) off the floor. Do not let your knee bend. Hold this position for __________ seconds. Keep these muscles tense as you lower your leg. Relax the muscles slowly and completely between repetitions. Repeat __________ times. Complete this exercise __________ times a day. Hip abductors, standing This  exercise strengthens the muscles that move the leg and hip joint away from the center of the body (hip abductors). Tie one end of a rubber exercise band or tubing to a secure surface, such as a chair, table, or pole. Loop the other end of the band or tubing around your left / right ankle. Keeping your ankle with the band or tubing directly opposite the secured end, step away until there is tension in the tubing or band. Hold on to a chair, table, or pole as needed for balance. Lift your left / right leg out to your side. While you do this: Keep your back upright. Keep your shoulders over your hips. Keep your toes pointing forward. Make sure to use your hip muscles to slowly lift your leg. Do not tip your body or  forcefully lift your leg. Hold this position for __________ seconds. Slowly return to the starting position. Repeat __________ times. Complete this exercise __________ times a day. Squats This exercise strengthens the muscles in the front of your thigh (quadriceps). Stand in front of a table, or stand in a doorframe so your feet and knees are in line with the frame. You may place your hands on the table or frame for balance. Slowly bend your knees and lower your hips like you are going to sit in a chair. Keep your lower legs in a straight up-and-down position. Do not let your hips go lower than your knees. Do not bend your knees lower than told by your provider. If your hip pain increases, do not bend as low. Hold this position for ___________ seconds. Slowly push with your legs to return to standing. Do not use your hands to pull yourself to standing. Repeat __________ times. Complete this exercise __________ times a day. This information is not intended to replace advice given to you by your health care provider. Make sure you discuss any questions you have with your health care provider. Document Revised: 06/26/2022 Document Reviewed: 06/26/2022 Elsevier Patient Education  2024 [employer].

## 2023-11-21 ENCOUNTER — Other Ambulatory Visit: Payer: Self-pay | Admitting: Internal Medicine

## 2023-11-21 DIAGNOSIS — J4551 Severe persistent asthma with (acute) exacerbation: Secondary | ICD-10-CM

## 2023-11-22 NOTE — Telephone Encounter (Signed)
Requested Prescriptions  Pending Prescriptions Disp Refills   albuterol (VENTOLIN HFA) 108 (90 Base) MCG/ACT inhaler [Pharmacy Med Name: Albuterol Sulfate HFA 108 (90 Base) MCG/ACT Inhalation Aerosol Solution] 18 g 1    Sig: INHALE 1 TO 2 PUFFS BY MOUTH EVERY 6 HOURS AS NEEDED FOR WHEEZING FOR SHORTNESS OF BREATH     Pulmonology:  Beta Agonists 2 Passed - 11/22/2023 10:30 AM      Passed - Last BP in normal range    BP Readings from Last 1 Encounters:  11/08/23 122/76         Passed - Last Heart Rate in normal range    Pulse Readings from Last 1 Encounters:  08/28/23 61         Passed - Valid encounter within last 12 months    Recent Outpatient Visits           2 weeks ago Acute pain of right knee   [facility] [place], Salvadore Oxford, NP   3 months ago Chest pain, unspecified type   [facility] [place], Oswaldo [place], [place]   3 months ago [place]   [facility] [place], [place] W, NP   5 months ago Chronic left-sided thoracic back pain   [facility] [place], Salvadore Oxford, NP   6 months ago Primary hypertension   [facility] [place], Salvadore Oxford, NP       Future Appointments             In 1 week Erin Fulling, MD [facility] [place]   In 2 months Baity, Salvadore Oxford, NP  [facility], [facility]

## 2023-11-25 ENCOUNTER — Ambulatory Visit: Payer: BC Managed Care – PPO | Attending: Otolaryngology

## 2023-11-25 DIAGNOSIS — G4733 Obstructive sleep apnea (adult) (pediatric): Secondary | ICD-10-CM | POA: Diagnosis not present

## 2023-11-25 DIAGNOSIS — G4710 Hypersomnia, unspecified: Secondary | ICD-10-CM | POA: Diagnosis not present

## 2023-11-25 DIAGNOSIS — R0683 Snoring: Secondary | ICD-10-CM | POA: Insufficient documentation

## 2023-11-26 ENCOUNTER — Ambulatory Visit
Admission: RE | Admit: 2023-11-26 | Discharge: 2023-11-26 | Disposition: A | Discharge status: Home / Self Care | Payer: BC Managed Care – PPO | Source: Home / Self Care | Attending: Internal Medicine | Admitting: Internal Medicine

## 2023-11-26 ENCOUNTER — Ambulatory Visit
Admission: RE | Admit: 2023-11-26 | Discharge: 2023-11-26 | Disposition: A | Discharge status: Home / Self Care | Payer: BC Managed Care – PPO | Source: Ambulatory Visit | Attending: Internal Medicine | Admitting: Internal Medicine

## 2023-11-26 ENCOUNTER — Other Ambulatory Visit: Payer: Self-pay | Admitting: Internal Medicine

## 2023-11-26 ENCOUNTER — Telehealth: Payer: Self-pay | Admitting: Internal Medicine

## 2023-11-26 DIAGNOSIS — M25551 Pain in right hip: Secondary | ICD-10-CM

## 2023-11-26 DIAGNOSIS — I878 Other specified disorders of veins: Secondary | ICD-10-CM | POA: Diagnosis not present

## 2023-11-26 DIAGNOSIS — M87051 Idiopathic aseptic necrosis of right femur: Secondary | ICD-10-CM

## 2023-11-26 DIAGNOSIS — M25561 Pain in right knee: Secondary | ICD-10-CM | POA: Diagnosis not present

## 2023-11-26 NOTE — Telephone Encounter (Signed)
X-ray ordered.  He will need to come to the office Monday through Thursday between 830 and 4 to get this done.  No appointment needed.

## 2023-11-26 NOTE — Telephone Encounter (Signed)
Pt requesting xray to be ordered for his right hip and knee. Pt saw Miguel Perez on 11/08/2023 in regards to this issue. Pt states he was advised by provider to reach out to office if unable to get xray done elsewhere.   Please assist pt further

## 2023-11-28 ENCOUNTER — Encounter: Payer: Self-pay | Admitting: Internal Medicine

## 2023-11-28 NOTE — Telephone Encounter (Signed)
Pt wanting MRI in [place] now instead of [place]. Are we able to change that with the current order?

## 2023-11-28 NOTE — Telephone Encounter (Unsigned)
Copied from CRM [id number]. Topic: General - Other >> Nov 28, 2023  8:18 AM Clide Dales wrote: Patient called and asked if he could have his MRI moved to [facility]. Please advise.

## 2023-11-28 NOTE — Addendum Note (Signed)
Addended by: Lorre Munroe on: 11/28/2023 08:06 AM   Modules accepted: Orders

## 2023-12-03 DIAGNOSIS — M25551 Pain in right hip: Secondary | ICD-10-CM | POA: Diagnosis not present

## 2023-12-03 DIAGNOSIS — I10 Essential (primary) hypertension: Secondary | ICD-10-CM | POA: Diagnosis not present

## 2023-12-03 DIAGNOSIS — Z87891 Personal history of nicotine dependence: Secondary | ICD-10-CM | POA: Diagnosis not present

## 2023-12-03 DIAGNOSIS — E785 Hyperlipidemia, unspecified: Secondary | ICD-10-CM | POA: Diagnosis not present

## 2023-12-05 ENCOUNTER — Ambulatory Visit: Payer: BC Managed Care – PPO | Admitting: Internal Medicine

## 2023-12-05 ENCOUNTER — Encounter: Payer: Self-pay | Admitting: Internal Medicine

## 2023-12-05 VITALS — BP 130/76 | HR 63 | Temp 97.6°F | Ht 65.0 in | Wt 232.0 lb

## 2023-12-05 DIAGNOSIS — G4733 Obstructive sleep apnea (adult) (pediatric): Secondary | ICD-10-CM | POA: Diagnosis not present

## 2023-12-05 DIAGNOSIS — M4200 Juvenile osteochondrosis of spine, site unspecified: Secondary | ICD-10-CM | POA: Diagnosis not present

## 2023-12-05 DIAGNOSIS — M546 Pain in thoracic spine: Secondary | ICD-10-CM | POA: Diagnosis not present

## 2023-12-05 NOTE — Patient Instructions (Signed)
Plan to Start AUTO CPAP 5-12 cm h20 STAT Recommend weight loss  Avoid Allergens and Irritants Avoid secondhand smoke Avoid SICK contacts Recommend  Masking  when appropriate Recommend Keep up-to-date with vaccinations

## 2023-12-05 NOTE — Progress Notes (Signed)
Name: Miguel Perez. MRN: [id number] DOB: 11-22-1990    CHIEF COMPLAINT:  Follow Up Assessment of Sleep Anea   HISTORY OF PRESENT ILLNESS: Follow up OSA assessment HST performed shows severe OSA AHI 95  Discussed sleep data and reviewed with patient.  Encouraged proper weight management.  Discussed driving precautions and its relationship with hypersomnolence.  Discussed operating dangerous equipment and its relationship with hypersomnolence.  Discussed sleep hygiene, and benefits of a fixed sleep waked time.  The importance of getting eight or more hours of sleep discussed with patient.  Discussed limiting the use of the computer and television before bedtime.  Decrease naps during the day, so night time sleep will become enhanced.  Limit caffeine, and sleep deprivation.  HTN, stroke, and heart failure are potential risk factors.   CPAP titration study performed Results pending  PAST MEDICAL HISTORY :   has a past medical history of Asthma.  has a past surgical history that includes Hernia repair. Prior to Admission medications   Medication Sig Start Date End Date Taking? Authorizing Provider  albuterol (VENTOLIN HFA) 108 (90 Base) MCG/ACT inhaler INHALE 1 TO 2 PUFFS BY MOUTH EVERY 6 HOURS AS NEEDED FOR WHEEZING FOR SHORTNESS OF BREATH 11/22/23  Yes Lorre Munroe, NP  amLODIPine-Valsartan-HCTZ 10-320-25 MG TABS Take 1 tablet by mouth once daily 09/16/23  Yes Baity, Salvadore Oxford, NP  atorvastatin (LIPITOR) 20 MG tablet Take 1 tablet (20 mg total) by mouth daily. 03/06/23  Yes Lorre Munroe, NP  Blood Glucose Monitoring Suppl DEVI Used to check blood sugar once a day. DX: E11.9   May substitute to any manufacturer covered by patient's insurance. 08/08/23  Yes Lorre Munroe, NP  fenofibrate (TRICOR) 145 MG tablet Take 1 tablet by mouth once daily 09/09/23  Yes Baity, Salvadore Oxford, NP  fluticasone (FLONASE) 50 MCG/ACT nasal spray Place into both nostrils daily.   Yes [provider]  fluticasone-salmeterol (ADVAIR) 100-50 MCG/ACT AEPB Inhale 1 puff into the lungs 2 (two) times daily. 03/06/23  Yes Baity, Salvadore Oxford, NP  Glucose Blood (BLOOD GLUCOSE TEST STRIPS) STRP Used to check blood sugar once a day. DX: E11.9   May substitute to any manufacturer covered by patient's insurance. 08/08/23  Yes Baity, Salvadore Oxford, NP  labetalol (NORMODYNE) 100 MG tablet Take 1 tablet (100 mg total) by mouth 2 (two) times daily. 03/06/23  Yes Lorre Munroe, NP  Lancet Device MISC Used to check blood sugar once a day. DX: E11.9   May substitute to any manufacturer covered by patient's insurance. 08/08/23  Yes Lorre Munroe, NP  omeprazole (PRILOSEC) 20 MG capsule Take 1 capsule (20 mg total) by mouth daily. 05/06/23  Yes Lorre Munroe, NP   No Known Allergies  FAMILY HISTORY:  family history includes Hypertension in his mother. SOCIAL HISTORY:  reports that he has quit smoking. His smoking use included cigarettes. He has a 11 pack-year smoking history. He has never used smokeless tobacco. He reports current alcohol use of about 8.0 standard drinks of alcohol per week. He reports that he does not use drugs.   BP 130/76 (BP Location: Left Arm, Patient Position: Sitting, Cuff Size: Normal)   Pulse 63   Temp 97.6 F (36.4 C) (Temporal)   Ht 5\' 5"  (1.651 m)   Wt 232 lb (105.2 kg)   SpO2 97%   BMI 38.61 kg/m     Review of Systems: Gen:  Denies  fever, sweats, chills weight  loss  HEENT: Denies blurred vision, double vision, ear pain, eye pain, hearing loss, nose bleeds, sore throat Cardiac:  No dizziness, chest pain or heaviness, chest tightness,edema, No JVD Resp:   No cough, -sputum production, -shortness of breath,-wheezing, -hemoptysis,  Other:  All other systems negative   Physical Examination:   General Appearance: No distress  EYES PERRLA, EOM intact.   NECK Supple, No JVD Pulmonary: normal breath sounds, No wheezing.  CardiovascularNormal S1,S2.  No m/r/g.    Abdomen: Benign, Soft, non-tender. Neurology UE/LE 5/5 strength, no focal deficits Ext pulses intact, cap refill intact ALL OTHER ROS ARE NEGATIVE    ASSESSMENT AND PLAN SYNOPSIS  33 yo obese Patient with signs and symptoms and diagnosis of SEVERE  OSA  in the setting of obesity and deconditioned state  OSA assessment We will need to start AUTOCPAP 5-12 cm h20 STAT Referral DME for new machine, medium sized    Obesity -recommend significant weight loss -recommend changing diet  Deconditioned state -Recommend increased daily activity and exercise    MEDICATION ADJUSTMENTS/LABS AND TESTS ORDERED: Plan to Start AUTO CPAP 5-12 cm h20 STAT Recommend weight loss Avoid Allergens and Irritants Avoid secondhand smoke Avoid SICK contacts Recommend  Masking  when appropriate Recommend Keep up-to-date with vaccinations    CURRENT MEDICATIONS REVIEWED AT LENGTH WITH PATIENT TODAY   Patient  satisfied with Plan of action and management. All questions answered   Follow up 4 weeks   I spent a total of 42 minutes reviewing chart data, face-to-face evaluation with the patient, counseling and coordination of care as detailed above.      Lucie Leather, M.D.  [facility]  Medical Director [facility] Medical Director [facility] Cardio-Pulmonary Department

## 2023-12-07 ENCOUNTER — Telehealth (INDEPENDENT_AMBULATORY_CARE_PROVIDER_SITE_OTHER): Payer: Self-pay | Admitting: Pulmonary Disease

## 2023-12-07 DIAGNOSIS — G4733 Obstructive sleep apnea (adult) (pediatric): Secondary | ICD-10-CM

## 2023-12-07 NOTE — Telephone Encounter (Signed)
BiPAP 19/15 required Can use auto biPAP

## 2023-12-11 ENCOUNTER — Other Ambulatory Visit: Payer: Self-pay | Admitting: Internal Medicine

## 2023-12-11 DIAGNOSIS — E782 Mixed hyperlipidemia: Secondary | ICD-10-CM

## 2023-12-12 ENCOUNTER — Encounter: Payer: Self-pay | Admitting: Internal Medicine

## 2023-12-12 ENCOUNTER — Ambulatory Visit: Payer: BC Managed Care – PPO | Admitting: Internal Medicine

## 2023-12-12 VITALS — BP 122/68 | Ht 65.0 in | Wt 235.0 lb

## 2023-12-12 DIAGNOSIS — M87051 Idiopathic aseptic necrosis of right femur: Secondary | ICD-10-CM

## 2023-12-12 DIAGNOSIS — M25561 Pain in right knee: Secondary | ICD-10-CM | POA: Diagnosis not present

## 2023-12-12 DIAGNOSIS — G8929 Other chronic pain: Secondary | ICD-10-CM

## 2023-12-12 MED ORDER — HYDROCODONE-ACETAMINOPHEN 5-325 MG PO TABS: 1.0000 | ORAL_TABLET | Freq: Two times a day (BID) | ORAL | 0 refills | Status: DC | PRN

## 2023-12-12 MED ORDER — MELOXICAM 15 MG PO TABS: 15.0000 mg | ORAL_TABLET | Freq: Every day | ORAL | 0 refills | Status: DC

## 2023-12-12 NOTE — Progress Notes (Signed)
 Subjective:    Patient ID: Miguel KANDICE Abran Mickey., male    DOB: [birth year], 33 y.o.   MRN: [id number]  HPI    Discussed the use of AI scribe software for clinical note transcription with the patient, who gave verbal consent to proceed.  The patient, with a history of chronic thoracic back pain, presents with a complaint of right knee pain extending up the thigh into the hip area. The pain began approximately 2 months ago, following an incident where the patient fell asleep on the floor. The pain has been consistent since the onset and has worsened over the last week and a half.  The patient describes the pain as a feeling of weakness, like his leg is going to give out. When weight is put on the leg, the patient experiences a sharp pain, primarily concentrated in the knee. There is no associated numbness or tingling in the lower part of the leg.  The patient sought care at an urgent care center, where he was prescribed prednisone  for a few days. The prednisone  provided temporary relief, but the pain returned shortly after the course was completed. The patient has also been taking Aleve  and wearing a knee brace, which provides minimal relief.  The patient reports difficulty with mobility, particularly when walking up and down stairs. He has to lift the affected foot first and then step up. Despite these symptoms, there is no reported swelling in the leg.  The patient has a history of receiving physical therapy for his back pain at an orthopedic clinic. He is scheduled to receive shots for his back pain at the end of January. He was seen in followup by me (PCP) 11/08/23. Xrays were ordered at that time.    X-ray of the right knee showed:  IMPRESSION: 1. Trace peripheral spurring in the patellofemoral compartment. 2. Possible mild infrapatellar soft tissue edema.  X-ray of the right hip showed:  IMPRESSION: Geographic area of sclerosis in the right femoral head, suspicious for bone infarct  avascular necrosis. No convincing articular involvement by radiograph. Recommend MRI for further evaluation.   He has an MRI scheduled for 2/28 at [facility]. He reports the pain is severe. He went to the ER 1/28 for the same. They advised him that they could not move up his MRI. They gave him a shot of toradol  and cyclobenzaprine which her reports did not provide any relief. He is taking dual pain reliever with minimal relief of symptoms.    Review of Systems     Past Medical History:  Diagnosis Date   Asthma     Current Outpatient Medications  Medication Sig Dispense Refill   albuterol  (VENTOLIN  HFA) 108 (90 Base) MCG/ACT inhaler INHALE 1 TO 2 PUFFS BY MOUTH EVERY 6 HOURS AS NEEDED FOR WHEEZING FOR SHORTNESS OF BREATH 18 g 1   amLODIPine -Valsartan -HCTZ 10-320-25 MG TABS Take 1 tablet by mouth once daily 90 tablet 1   atorvastatin  (LIPITOR) 20 MG tablet Take 1 tablet (20 mg total) by mouth daily. 90 tablet 1   Blood Glucose Monitoring Suppl DEVI Used to check blood sugar once a day. DX: E11.9   May substitute to any manufacturer covered by patient's insurance. 1 each 0   fenofibrate  (TRICOR ) 145 MG tablet Take 1 tablet by mouth once daily 90 tablet 1   fluticasone  (FLONASE ) 50 MCG/ACT nasal spray Place into both nostrils daily.     fluticasone -salmeterol (ADVAIR) 100-50 MCG/ACT AEPB Inhale 1 puff into the lungs 2 (two) times  daily. 180 each 1   Glucose Blood (BLOOD GLUCOSE TEST STRIPS) STRP Used to check blood sugar once a day. DX: E11.9   May substitute to any manufacturer covered by patient's insurance. 100 strip 1   labetalol  (NORMODYNE ) 100 MG tablet Take 1 tablet (100 mg total) by mouth 2 (two) times daily. 180 tablet 1   Lancet Device MISC Used to check blood sugar once a day. DX: E11.9   May substitute to any manufacturer covered by patient's insurance. 1 each 0   omeprazole  (PRILOSEC) 20 MG capsule Take 1 capsule (20 mg total) by mouth daily. 90 capsule 1   No current  facility-administered medications for this visit.    No Known Allergies  Family History  Problem Relation Age of Onset   Hypertension Mother     Social History   Socioeconomic History   Marital status: Single    Spouse name: Not on file   Number of children: Not on file   Years of education: Not on file   Highest education level: GED or equivalent  Occupational History   Not on file  Tobacco Use   Smoking status: Former    Current packs/day: 1.00    Average packs/day: 1 pack/day for 11.0 years (11.0 ttl pk-yrs)    Types: Cigarettes   Smokeless tobacco: Never  Vaping Use   Vaping status: Every Day  Substance and Sexual Activity   Alcohol use: Yes    Alcohol/week: 8.0 standard drinks of alcohol    Types: 8 Standard drinks or equivalent per week    Comment: 8 standard on a weekly basis    Drug use: No   Sexual activity: Yes  Other Topics Concern   Not on file  Social History Narrative   Not on file   Social Drivers of Health   Financial Resource Strain: Low Risk  (11/08/2023)   Overall Financial Resource Strain (CARDIA)    Difficulty of Paying Living Expenses: Not hard at all  Food Insecurity: No Food Insecurity (11/08/2023)   Hunger Vital Sign    Worried About Running Out of Food in the Last Year: Never true    Ran Out of Food in the Last Year: Never true  Transportation Needs: No Transportation Needs (11/08/2023)   PRAPARE - Administrator, Civil Service (Medical): No    Lack of Transportation (Non-Medical): No  Physical Activity: Not on file  Stress: Not on file  Social Connections: Moderately Integrated (11/08/2023)   Social Connection and Isolation Panel [NHANES]    Frequency of Communication with Friends and Family: More than three times a week    Frequency of Social Gatherings with Friends and Family: Once a week    Attends Religious Services: More than 4 times per year    Active Member of [employer] or Organizations: No    Attends Hospital Doctor: Not on file    Marital Status: Married  Catering Manager Violence: Not on file     Constitutional: Denies fever, malaise, fatigue, headache or abrupt weight changes.  Respiratory: Denies difficulty breathing, shortness of breath, cough or sputum production.   Cardiovascular: Denies chest pain, chest tightness, palpitations or swelling in the hands or feet.  Musculoskeletal: Patient reports chronic thoracic back pain, right knee pain, right hip pain, right thigh pain.  Denies decrease in range of motion, difficulty with gait, or joint swelling.  Skin: Denies redness, rashes, lesions or ulcercations.  Neurological: Pt reports right leg weakness. Denies numbness, tingling  or problems with balance and coordination.    No other specific complaints in a complete review of systems (except as listed in HPI above).  Objective:   Physical Exam   There were no vitals taken for this visit.   Wt Readings from Last 3 Encounters:  12/05/23 232 lb (105.2 kg)  11/08/23 227 lb 9.6 oz (103.2 kg)  08/28/23 235 lb (106.6 kg)    General: Appears his stated age, obese, in NAD. Skin: Warm, dry and intact. No ulcerations noted. Cardiovascular: Normal rate and rhythm.  Pulmonary/Chest: Normal effort and positive vesicular breath sounds. No respiratory distress. No wheezes, rales or ronchi noted.  Musculoskeletal: Normal flexion, extension, rotation and lateral bending of the spine but pain with lateral bending to the right.  Pain with palpation over the thoracic spine.  No bony tenderness noted over the lumbar spine or right SI joint.  Decreased abduction, adduction, internal and external rotation of the right hip.  No pain with palpation of the right hip.  Pain with palpation along the right IT band.  Normal flexion extension of the right knee but crepitus noted with range of motion.  No joint swelling noted.  Pain with palpation over the right lateral knee.  Strength 5/5 LLE, strength 4/5 with  resistance to hip flexion RLE.  Able to stand on tiptoes and heels but difficult.  Limping gait.  Neurological: Alert and oriented.  Sensation intact to RLE.  BMET    Component Value Date/Time   NA 138 08/22/2023 1506   K 4.3 08/22/2023 1506   CL 102 08/22/2023 1506   CO2 28 08/22/2023 1506   GLUCOSE 87 08/22/2023 1506   BUN 26 (H) 08/22/2023 1506   CREATININE 1.04 08/22/2023 1506   CALCIUM  9.4 08/22/2023 1506   GFRNONAA >60 05/23/2023 2133   GFRNONAA 100 05/23/2020 1533   GFRAA >60 07/01/2020 1104   GFRAA 115 05/23/2020 1533    Lipid Panel     Component Value Date/Time   CHOL 169 03/06/2023 1456   TRIG 362 (H) 03/06/2023 1456   HDL 49 03/06/2023 1456   CHOLHDL 3.4 03/06/2023 1456   LDLCALC 76 03/06/2023 1456    CBC    Component Value Date/Time   WBC 6.2 08/22/2023 1506   RBC 4.86 08/22/2023 1506   HGB 13.2 08/22/2023 1506   HCT 40.6 08/22/2023 1506   PLT 256 08/22/2023 1506   MCV 83.5 08/22/2023 1506   MCH 27.2 08/22/2023 1506   MCHC 32.5 08/22/2023 1506   RDW 13.1 08/22/2023 1506   LYMPHSABS 1.5 07/01/2020 1104   MONOABS 0.5 07/01/2020 1104   EOSABS 254 08/22/2023 1506   BASOSABS 43 08/22/2023 1506    Hgb A1C Lab Results  Component Value Date   HGBA1C 6.2 (H) 08/08/2023           Assessment & Plan:   Chronic Right Knee Pain:  Previous x-ray consistent with osteoarthritis Knee pain likely complicated by his avascular necrosis of this right hip Rx for meloxicam  15 mg p.o. daily, consume with food Rx for Vicodin 5-325 mg twice daily-addiction potential discussed Referral to orthopedics for further evaluation and treatment  Avascular necrosis right hip:  Previous x-ray reviewed MRI has been scheduled Rx for meloxicam  15 mg p.o. daily, consume with food Rx for Vicodin 5-325 mg twice daily-addiction potential discussed Referral to orthopedics for further evaluation and treatment   RTC in 2 months for your annual exam Angeline Laura, NP

## 2023-12-12 NOTE — Patient Instructions (Signed)
 Death of Bone Cells (Osteonecrosis): What to Know  Osteonecrosis is when bone dies because it does not have enough blood supply. Without enough blood supply, the inside layer of the affected bone dies. Over time, small breaks form in the outer layer of the bone. If this affects a bone near a joint, the joint may collapse or move out of place, called dislocation. The most commonly affected bones are: The hip joint, especially the top of the thigh bone, also called the femoral head. The top of the upper arm bone, also called the humeral head. Other parts of the body that may be affected include the jaw, wrist, fingers, knee, and foot. You may hear other names for this condition. It can be called avascular necrosis or aseptic necrosis. What are the causes? Osteonecrosis happens when there is not enough blood flowing to the bones. This can happen if: You break a bone. If blood clots block the blood flow. If fat cells get too big and block the blood supply. What increases the risk? Using too much alcohol. Smoking. Long-term or frequent use of steroid medicines. Having had a joint injury. Being exposed to a lot of radiation. Having a medical condition such as: Sickle cell disease. Gaucher disease. HIV or AIDS. Autoimmune disease. This is a disease in which the immune system attacks healthy parts of the body. What are the signs or symptoms? The main symptoms of this condition are: Pain. If an affected joint collapses, the pain may suddenly get very bad. Less ability to move the affected bone or joint. How is this diagnosed? This condition may be diagnosed based on: Your symptoms and medical history. A physical exam. Imaging tests, such as X-rays, bone scans, and MRI. How is this treated? Treatment for this condition may include: Medicines for pain. Medicine to improve bone growth. Avoiding placing any pressure or weight on the affected area. If osteonecrosis happens in your hip, ankle,  or foot, you may need to use crutches or a rolling scooter to help you move around. Physical therapy to the affected area to help it move better and get stronger. Surgery, such as: Core decompression. One or more holes are placed in the bone for new blood vessels to grow into. This provides a renewed blood supply to the bone. This surgery may reduce pain and pressure and slow the destruction of bones and joints. Osteotomy. The bone is reshaped to reduce stress on the affected area of the joint. Bone grafting. Healthy bone from a different part of your body is used to replace damaged bone. Total joint replacement. The affected surfaces of a bone are replaced with artificial parts called prostheses. Electrical stimulation, also called e-stim. During this procedure, a probe with electricity is placed over the skin. This may help to encourage new bone growth. High-pressure oxygen therapy. This is called hyperbaric oxygen. Follow these instructions at home: Activity Exercise as told. Ask your health care provider if it's safe to drive or use machines while taking your medicine. Do not stand or walk on your injured leg until you're told it's OK. Use crutches or a rolling scooter. Ask what things are safe for you to do at home. Ask when you can go back to work or school. Managing pain, stiffness, and swelling Use ice or an ice pack as told. Place a towel between your skin and the ice. Leave the ice on for 20 minutes, 2-3 times a day. Use heat as told. Use the heat source that your provider  recommends, such as a moist heat pack or a heating pad. Do this as often as told. Place a towel between your skin and the heat source. Leave the heat on for 20-30 minutes. If your skin turns red, take off the ice or heat right away to prevent skin damage. The risk of damage is higher if you can't feel pain, heat, or cold. General instructions Take your medicines only as told. If a bone in your arm or leg is  affected, ask your provider if it's safe for you to drive. Do not smoke, vape, or use nicotine or tobacco. Do not drink alcohol. Keep all follow-up visits. Your provider will check your healing. Where to find more information National Organization for Rare Disorders: rarediseases.org Contact a health care provider if: You have pain that gets worse or does not get better with medicine. You have more trouble moving your joint. Get help right away if: Your pain suddenly becomes very bad. This information is not intended to replace advice given to you by your health care provider. Make sure you discuss any questions you have with your health care provider. Document Revised: 04/09/2023 Document Reviewed: 04/09/2023 Elsevier Patient Education  2024 [employer].

## 2023-12-13 ENCOUNTER — Telehealth: Payer: Self-pay

## 2023-12-13 NOTE — Telephone Encounter (Signed)
 Requested Prescriptions  Pending Prescriptions Disp Refills   atorvastatin  (LIPITOR) 20 MG tablet [Pharmacy Med Name: Atorvastatin  Calcium  20 MG Oral Tablet] 90 tablet 0    Sig: Take 1 tablet by mouth once daily     Cardiovascular:  Antilipid - Statins Failed - 12/13/2023  7:40 AM      Failed - Lipid Panel in normal range within the last 12 months    Cholesterol  Date Value Ref Range Status  03/06/2023 169 <200 mg/dL Final   LDL Cholesterol (Calc)  Date Value Ref Range Status  03/06/2023 76 mg/dL (calc) Final    Comment:    Reference range: <100 . Desirable range <100 mg/dL for primary prevention;   <70 mg/dL for patients with CHD or diabetic patients  with > or = 2 CHD risk factors. SABRA LDL-C is now calculated using the Martin-Hopkins  calculation, which is a validated novel method providing  better accuracy than the Friedewald equation in the  estimation of LDL-C.  Gladis APPLETHWAITE et al. SANDREA. 7986;689(80): 2061-2068  (http://education.QuestDiagnostics.com/faq/FAQ164)    HDL  Date Value Ref Range Status  03/06/2023 49 > OR = 40 mg/dL Final   Triglycerides  Date Value Ref Range Status  03/06/2023 362 (H) <150 mg/dL Final    Comment:    . If a non-fasting specimen was collected, consider repeat triglyceride testing on a fasting specimen if clinically indicated.  Veatrice et al. J. of Clin. Lipidol. 2015;9:129-169. SABRA          Passed - Patient is not pregnant      Passed - Valid encounter within last 12 months    Recent Outpatient Visits           1 month ago Acute pain of right knee   [facility] [place], Angeline ORN, NP   3 months ago Chest pain, unspecified type   [facility] [place], Rocky BRAVO, PA-C   4 months ago [place]   [facility] [place], [place] W, NP   5 months ago Chronic left-sided thoracic back pain   [facility] [place], Angeline ORN, NP   7 months ago Primary  hypertension   [facility] [place], Angeline ORN, NP       Future Appointments             In 2 weeks Kasa, Kurian, MD [facility] [place]   In 1 month [place], Angeline ORN, NP [facility]

## 2023-12-13 NOTE — Telephone Encounter (Signed)
 Received prior auth for Hydrocodone .

## 2023-12-13 NOTE — Telephone Encounter (Signed)
 carlin kiang (Key: [id number]) PA Case ID #: [id number] Need Help? Call us  at [phone] Status sent iconSent to Plan today Drug HYDROcodone -Acetaminophen  5-325MG  tablets ePA cloud logo Form [employer] Medicare Electronic PA Form [phone] NCPDP)

## 2023-12-14 DIAGNOSIS — G4733 Obstructive sleep apnea (adult) (pediatric): Secondary | ICD-10-CM | POA: Diagnosis not present

## 2023-12-15 ENCOUNTER — Other Ambulatory Visit: Payer: Self-pay | Admitting: Internal Medicine

## 2023-12-15 DIAGNOSIS — E782 Mixed hyperlipidemia: Secondary | ICD-10-CM

## 2023-12-16 NOTE — Telephone Encounter (Signed)
 Requested Prescriptions  Pending Prescriptions Disp Refills   fluticasone -salmeterol (ADVAIR) 100-50 MCG/ACT AEPB [Pharmacy Med Name: Fluticasone -Salmeterol 100-50 MCG/DOSE Inhalation Aerosol Powder Breath Activated] 180 each 0    Sig: INHALE 1 DOSE BY MOUTH TWICE DAILY     Pulmonology:  Combination Products Passed - 12/16/2023  5:17 PM      Passed - Valid encounter within last 12 months    Recent Outpatient Visits           1 month ago Acute pain of right knee   [facility] [facility] [place], Rankin Buzzard, NP   3 months ago Chest pain, unspecified type   [facility] M[place], Pearla Bottom, PA-C   4 months ago [place]   [place] [facility] [place], [place] W, NP   5 months ago Chronic left-sided thoracic back pain   [facility] [place], Rankin Buzzard, NP   7 months ago Primary hypertension   [facility] [place], [place], NP       Future Appointments             In 2 weeks Kasa, Kurian, MD [facility] at [place]   In 1 month [place], Rankin Buzzard, NP [facility]            Refused Prescriptions Disp Refills   atorvastatin  (LIPITOR) 20 MG tablet [Pharmacy Med Name: Atorvastatin  Calcium  20 MG Oral Tablet] 90 tablet 0    Sig: Take 1 tablet by mouth once daily     Cardiovascular:  Antilipid - Statins Failed - 12/16/2023  5:17 PM      Failed - Lipid Panel in normal range within the last 12 months    Cholesterol  Date Value Ref Range Status  03/06/2023 169 <200 mg/dL Final   LDL Cholesterol (Calc)  Date Value Ref Range Status  03/06/2023 76 mg/dL (calc) Final    Comment:    Reference range: <100 . Desirable range <100 mg/dL for primary prevention;   <70 mg/dL for patients with CHD or diabetic patients  with > or = 2 CHD risk factors. Aaron Aas LDL-C is now calculated using the Martin-Hopkins  calculation, which is a validated  novel method providing  better accuracy than the Friedewald equation in the  estimation of LDL-C.  Melinda Sprawls et al. Erroll Heard. 1610;960(45): 2061-2068  (http://education.[employer].com/faq/FAQ164)    HDL  Date Value Ref Range Status  03/06/2023 49 > OR = 40 mg/dL Final   Triglycerides  Date Value Ref Range Status  03/06/2023 362 (H) <150 mg/dL Final    Comment:    . If a non-fasting specimen was collected, consider repeat triglyceride testing on a fasting specimen if clinically indicated.  Imagene Mam et al. J. of Clin. Lipidol. 2015;9:129-169. Aaron Aas          Passed - Patient is not pregnant      Passed - Valid encounter within last 12 months    Recent Outpatient Visits           1 month ago Acute pain of right knee   [place] [facility] [place], Rankin Buzzard, NP   3 months ago Chest pain, unspecified type   [facility] [place], Pearla Bottom, [place]   4 months ago [place]    [facility] [place], [place], NP   5 months ago Chronic left-sided thoracic back  pain   [facility] [place], Rankin Buzzard, NP   7 months ago Primary hypertension   [place] [place], Rankin Buzzard, NP       Future Appointments             In 2 weeks Kasa, Kurian, MD [facility]monary Care at [place]   In 1 month [place], Rankin Buzzard, NP [facility]

## 2024-01-02 ENCOUNTER — Ambulatory Visit: Payer: BC Managed Care – PPO | Admitting: Internal Medicine

## 2024-01-02 ENCOUNTER — Encounter: Payer: Self-pay | Admitting: Internal Medicine

## 2024-01-02 VITALS — BP 128/72 | HR 65 | Temp 97.6°F | Ht 65.0 in | Wt 232.0 lb

## 2024-01-02 DIAGNOSIS — G4733 Obstructive sleep apnea (adult) (pediatric): Secondary | ICD-10-CM | POA: Diagnosis not present

## 2024-01-02 NOTE — Patient Instructions (Addendum)
 Excellent Job A+ GOLD STAR!!  Continue CPAP as prescribed  We will obtain split-night sleep study for further optimization of CPAP settings  Avoid Allergens and Irritants Avoid secondhand smoke Avoid SICK contacts Recommend  Masking  when appropriate Recommend Keep up-to-date with vaccinations   Be aware of reduced alertness and do not drive or operate heavy machinery if experiencing this or drowsiness.  Exercise encouraged, as tolerated. Encouraged proper weight management.  Important to get eight or more hours of sleep  Limiting the use of the computer and television before bedtime.  Decrease naps during the day, so night time sleep will become enhanced.  Limit caffeine, and sleep deprivation.

## 2024-01-02 NOTE — Progress Notes (Unsigned)
 Name: Miguel Perez. MRN: [id number] DOB: 26-Apr-1991    CHIEF COMPLAINT:  Follow-up assessment for severe sleep apnea   HISTORY OF PRESENT ILLNESS: Follow up OSA assessment HST performed shows severe OSA AHI 95  CPAP download reviewed in detail AHI has significantly reduced down to 15 Auto CPAP 5-12 started Wears a fullface mask I have recommended that he gets a sleep study split-night Goal is to get his AHI reduced to less than 5  Patient has more energy more active less fatigue Patient use and benefits from his CPAP    Discussed sleep data and reviewed with patient.  Encouraged proper weight management.  Discussed driving precautions and its relationship with hypersomnolence.  Discussed operating dangerous equipment and its relationship with hypersomnolence.  Discussed sleep hygiene, and benefits of a fixed sleep waked time.  The importance of getting eight or more hours of sleep discussed with patient.  Discussed limiting the use of the computer and television before bedtime.  Decrease naps during the day, so night time sleep will become enhanced.  Limit caffeine, and sleep deprivation.  HTN, stroke, and heart failure are potential risk factors.   Discussed risk of untreated sleep apnea including cardiac arrhthymias, stroke, DM, pulm HTN.    PAST MEDICAL HISTORY :   has a past medical history of Asthma.  has a past surgical history that includes Hernia repair. Prior to Admission medications   Medication Sig Start Date End Date Taking? Authorizing Provider  albuterol (VENTOLIN HFA) 108 (90 Base) MCG/ACT inhaler INHALE 1 TO 2 PUFFS BY MOUTH EVERY 6 HOURS AS NEEDED FOR WHEEZING FOR SHORTNESS OF BREATH 11/22/23  Yes Lorre Munroe, NP  amLODIPine-Valsartan-HCTZ 10-320-25 MG TABS Take 1 tablet by mouth once daily 09/16/23  Yes Baity, Salvadore Oxford, NP  atorvastatin (LIPITOR) 20 MG tablet Take 1 tablet (20 mg total) by mouth daily. 03/06/23  Yes Lorre Munroe, NP  Blood  Glucose Monitoring Suppl DEVI Used to check blood sugar once a day. DX: E11.9   May substitute to any manufacturer covered by patient's insurance. 08/08/23  Yes Lorre Munroe, NP  fenofibrate (TRICOR) 145 MG tablet Take 1 tablet by mouth once daily 09/09/23  Yes Baity, Salvadore Oxford, NP  fluticasone (FLONASE) 50 MCG/ACT nasal spray Place into both nostrils daily.   Yes [provider]  fluticasone-salmeterol (ADVAIR) 100-50 MCG/ACT AEPB Inhale 1 puff into the lungs 2 (two) times daily. 03/06/23  Yes Baity, Salvadore Oxford, NP  Glucose Blood (BLOOD GLUCOSE TEST STRIPS) STRP Used to check blood sugar once a day. DX: E11.9   May substitute to any manufacturer covered by patient's insurance. 08/08/23  Yes Baity, Salvadore Oxford, NP  labetalol (NORMODYNE) 100 MG tablet Take 1 tablet (100 mg total) by mouth 2 (two) times daily. 03/06/23  Yes Lorre Munroe, NP  Lancet Device MISC Used to check blood sugar once a day. DX: E11.9   May substitute to any manufacturer covered by patient's insurance. 08/08/23  Yes Lorre Munroe, NP  omeprazole (PRILOSEC) 20 MG capsule Take 1 capsule (20 mg total) by mouth daily. 05/06/23  Yes Lorre Munroe, NP   No Known Allergies  FAMILY HISTORY:  family history includes Hypertension in his mother. SOCIAL HISTORY:  reports that he has quit smoking. His smoking use included cigarettes. He has a 11 pack-year smoking history. He has never used smokeless tobacco. He reports current alcohol use of about 8.0 standard drinks of alcohol per week. He reports that  he does not use drugs.   BP 128/72 (BP Location: Left Arm, Patient Position: Sitting, Cuff Size: Normal)   Pulse 65   Temp 97.6 F (36.4 C) (Temporal)   Ht 5\' 5"  (1.651 m)   Wt 232 lb (105.2 kg)   SpO2 96%   BMI 38.61 kg/m    Review of Systems: Gen:  Denies  fever, sweats, chills weight loss  HEENT: Denies blurred vision, double vision, ear pain, eye pain, hearing loss, nose bleeds, sore throat Cardiac:  No dizziness, chest  pain or heaviness, chest tightness,edema, No JVD Resp:   No cough, -sputum production, -shortness of breath,-wheezing, -hemoptysis,  Other:  All other systems negative   Physical Examination:   General Appearance: No distress  EYES PERRLA, EOM intact.   NECK Supple, No JVD Pulmonary: normal breath sounds, No wheezing.  CardiovascularNormal S1,S2.  No m/r/g.   Abdomen: Benign, Soft, non-tender. Neurology UE/LE 5/5 strength, no focal deficits Ext pulses intact, cap refill intact ALL OTHER ROS ARE NEGATIVE    ASSESSMENT AND PLAN SYNOPSIS  33 yo obese Patient with signs and symptoms and diagnosis of SEVERE  OSA  in the setting of obesity and deconditioned state  OSA assessment Patient has shown excellent compliance report since he started his CPAP therapy 2 weeks ago Patient has excellent compliance report Discussed in detail with patient OSA is well-controlled with CPAP Continue current prescription Auto CPAP 5-12 However I will obtain split-night sleep study for optimization of CPAP settings Goal is to be AHI less than 5 His current AHI is 15 which is significantly reduced from 95   Patient Instructions Continue to use CPAP every night, minimum of 4-6 hours a night.  Change equipment every 30 days or as directed by DME.  Wash your tubing with warm soap and water daily, hang to dry. Wash humidifier portion weekly. Use bottled, distilled water and change daily   Be aware of reduced alertness and do not drive or operate heavy machinery if experiencing this or drowsiness.  Exercise encouraged, as tolerated. Encouraged proper weight management.  Important to get eight or more hours of sleep  Limiting the use of the computer and television before bedtime.  Decrease naps during the day, so night time sleep will become enhanced.  Limit caffeine, and sleep deprivation.  HTN, stroke, uncontrolled diabetes and heart failure are potential risk factors.  Risk of untreated sleep apnea  including cardiac arrhthymias, stroke, DM, pulm HTN.    Obesity -recommend significant weight loss -recommend changing diet  Deconditioned state -Recommend increased daily activity and exercise  MEDICATION ADJUSTMENTS/LABS AND TESTS ORDERED: Continue CPAP as prescribed We will obtain split-night sleep study for further optimization of CPAP settings Avoid Allergens and Irritants Avoid secondhand smoke Avoid SICK contacts Recommend  Masking  when appropriate Recommend Keep up-to-date with vaccinations    CURRENT MEDICATIONS REVIEWED AT LENGTH WITH PATIENT TODAY   Patient  satisfied with Plan of action and management. All questions answered   Follow up 3 months   I spent a total of 43 minutes reviewing chart data, face-to-face evaluation with the patient, counseling and coordination of care as detailed above.      Lucie Leather, M.D.  [facility]  Medical Director [facility] Medical Director [facility] Cardio-Pulmonary Department

## 2024-01-03 DIAGNOSIS — S72001A Fracture of unspecified part of neck of right femur, initial encounter for closed fracture: Secondary | ICD-10-CM | POA: Diagnosis not present

## 2024-01-03 DIAGNOSIS — S73191A Other sprain of right hip, initial encounter: Secondary | ICD-10-CM | POA: Diagnosis not present

## 2024-01-03 DIAGNOSIS — M1611 Unilateral primary osteoarthritis, right hip: Secondary | ICD-10-CM | POA: Diagnosis not present

## 2024-01-03 DIAGNOSIS — X58XXXA Exposure to other specified factors, initial encounter: Secondary | ICD-10-CM | POA: Diagnosis not present

## 2024-01-03 DIAGNOSIS — M87051 Idiopathic aseptic necrosis of right femur: Secondary | ICD-10-CM | POA: Diagnosis not present

## 2024-01-05 ENCOUNTER — Other Ambulatory Visit: Payer: Self-pay | Admitting: Internal Medicine

## 2024-01-07 NOTE — Telephone Encounter (Signed)
 Requested medication (s) are due for refill today: Yes  Requested medication (s) are on the active medication list: Yes  Last refill:  12/12/23  Future visit scheduled: Yes  Notes to clinic:  Manual review.    Requested Prescriptions  Pending Prescriptions Disp Refills   meloxicam (MOBIC) 15 MG tablet [Pharmacy Med Name: Meloxicam 15 MG Oral Tablet] 30 tablet 0    Sig: Take 1 tablet by mouth once daily     Analgesics:  COX2 Inhibitors Failed - 01/07/2024  8:19 AM      Failed - Manual Review: Labs are only required if the patient has taken medication for more than 8 weeks.      Passed - HGB in normal range and within 360 days    Hemoglobin  Date Value Ref Range Status  08/22/2023 13.2 13.2 - 17.1 g/dL Final         Passed - Cr in normal range and within 360 days    Creat  Date Value Ref Range Status  08/22/2023 1.04 0.60 - 1.26 mg/dL Final   Creatinine, Urine  Date Value Ref Range Status  03/06/2023 113 20 - 320 mg/dL Final         Passed - HCT in normal range and within 360 days    HCT  Date Value Ref Range Status  08/22/2023 40.6 38.5 - 50.0 % Final         Passed - AST in normal range and within 360 days    AST  Date Value Ref Range Status  08/22/2023 20 10 - 40 U/L Final         Passed - ALT in normal range and within 360 days    ALT  Date Value Ref Range Status  08/22/2023 21 9 - 46 U/L Final         Passed - eGFR is 30 or above and within 360 days    GFR, Est African American  Date Value Ref Range Status  05/23/2020 115 > OR = 60 mL/min/1.65m2 Final   GFR calc Af Amer  Date Value Ref Range Status  07/01/2020 >60 >60 mL/min Final   GFR, Est Non African American  Date Value Ref Range Status  05/23/2020 100 > OR = 60 mL/min/1.41m2 Final   GFR, Estimated  Date Value Ref Range Status  05/23/2023 >60 >60 mL/min Final    Comment:    (NOTE) Calculated using the CKD-EPI Creatinine Equation (2021)    eGFR  Date Value Ref Range Status  08/22/2023 98  > OR = 60 mL/min/1.75m2 Final         Passed - Patient is not pregnant      Passed - Valid encounter within last 12 months    Recent Outpatient Visits           2 months ago Acute pain of right knee   [place] [facility] [place], Salvadore Oxford, NP   4 months ago Chest pain, unspecified type   [facility] M[place], Oswaldo [place], [place]   5 months ago [place]   [place] [facility] [place], [place], NP   6 months ago Chronic left-sided thoracic back pain   [place] [facility] [place], Salvadore Oxford, NP   8 months ago Primary hypertension   [facility] [place], Salvadore Oxford, NP       Future Appointments  In 2 weeks Baity, Salvadore Oxford, NP [place] [facility], [place]   In 2 months Erin Fulling, MD [facility]monary Care at [facility]

## 2024-01-09 DIAGNOSIS — M791 Myalgia, unspecified site: Secondary | ICD-10-CM | POA: Diagnosis not present

## 2024-01-11 DIAGNOSIS — G4733 Obstructive sleep apnea (adult) (pediatric): Secondary | ICD-10-CM | POA: Diagnosis not present

## 2024-01-27 ENCOUNTER — Ambulatory Visit (INDEPENDENT_AMBULATORY_CARE_PROVIDER_SITE_OTHER): Payer: BC Managed Care – PPO | Admitting: Internal Medicine

## 2024-01-27 ENCOUNTER — Encounter: Payer: Self-pay | Admitting: Internal Medicine

## 2024-01-27 VITALS — BP 122/70 | Ht 65.0 in | Wt 229.6 lb

## 2024-01-27 DIAGNOSIS — Z23 Encounter for immunization: Secondary | ICD-10-CM

## 2024-01-27 DIAGNOSIS — Z6838 Body mass index (BMI) 38.0-38.9, adult: Secondary | ICD-10-CM

## 2024-01-27 DIAGNOSIS — E1165 Type 2 diabetes mellitus with hyperglycemia: Secondary | ICD-10-CM

## 2024-01-27 DIAGNOSIS — E66812 Obesity, class 2: Secondary | ICD-10-CM

## 2024-01-27 DIAGNOSIS — Z0001 Encounter for general adult medical examination with abnormal findings: Secondary | ICD-10-CM

## 2024-01-27 MED ORDER — MELOXICAM 15 MG PO TABS: 15.0000 mg | ORAL_TABLET | Freq: Every day | ORAL | 0 refills | Status: DC

## 2024-01-27 MED ORDER — FLUTICASONE PROPIONATE 50 MCG/ACT NA SUSP: 1.0000 | Freq: Every day | NASAL | 5 refills | Status: AC

## 2024-01-27 NOTE — Patient Instructions (Signed)
 Health Maintenance, Male  Adopting a healthy lifestyle and getting preventive care are important in promoting health and wellness. Ask your health care provider about:  The right schedule for you to have regular tests and exams.  Things you can do on your own to prevent diseases and keep yourself healthy.  What should I know about diet, weight, and exercise?  Eat a healthy diet    Eat a diet that includes plenty of vegetables, fruits, low-fat dairy products, and lean protein.  Do not eat a lot of foods that are high in solid fats, added sugars, or sodium.  Maintain a healthy weight  Body mass index (BMI) is a measurement that can be used to identify possible weight problems. It estimates body fat based on height and weight. Your health care provider can help determine your BMI and help you achieve or maintain a healthy weight.  Get regular exercise  Get regular exercise. This is one of the most important things you can do for your health. Most adults should:  Exercise for at least 150 minutes each week. The exercise should increase your heart rate and make you sweat (moderate-intensity exercise).  Do strengthening exercises at least twice a week. This is in addition to the moderate-intensity exercise.  Spend less time sitting. Even light physical activity can be beneficial.  Watch cholesterol and blood lipids  Have your blood tested for lipids and cholesterol at 32 years of age, then have this test every 5 years.  You may need to have your cholesterol levels checked more often if:  Your lipid or cholesterol levels are high.  You are older than 32 years of age.  You are at high risk for heart disease.  What should I know about cancer screening?  Many types of cancers can be detected early and may often be prevented. Depending on your health history and family history, you may need to have cancer screening at various ages. This may include screening for:  Colorectal cancer.  Prostate cancer.  Skin cancer.  Lung  cancer.  What should I know about heart disease, diabetes, and high blood pressure?  Blood pressure and heart disease  High blood pressure causes heart disease and increases the risk of stroke. This is more likely to develop in people who have high blood pressure readings or are overweight.  Talk with your health care provider about your target blood pressure readings.  Have your blood pressure checked:  Every 3-5 years if you are 9-95 years of age.  Every year if you are 85 years old or older.  If you are between the ages of 29 and 29 and are a current or former smoker, ask your health care provider if you should have a one-time screening for abdominal aortic aneurysm (AAA).  Diabetes  Have regular diabetes screenings. This checks your fasting blood sugar level. Have the screening done:  Once every three years after age 23 if you are at a normal weight and have a low risk for diabetes.  More often and at a younger age if you are overweight or have a high risk for diabetes.  What should I know about preventing infection?  Hepatitis B  If you have a higher risk for hepatitis B, you should be screened for this virus. Talk with your health care provider to find out if you are at risk for hepatitis B infection.  Hepatitis C  Blood testing is recommended for:  Everyone born from [birth year] through 1964.  Anyone  with known risk factors for hepatitis C.  Sexually transmitted infections (STIs)  You should be screened each year for STIs, including gonorrhea and chlamydia, if:  You are sexually active and are younger than 32 years of age.  You are older than 32 years of age and your health care provider tells you that you are at risk for this type of infection.  Your sexual activity has changed since you were last screened, and you are at increased risk for chlamydia or gonorrhea. Ask your health care provider if you are at risk.  Ask your health care provider about whether you are at high risk for HIV. Your health care provider  may recommend a prescription medicine to help prevent HIV infection. If you choose to take medicine to prevent HIV, you should first get tested for HIV. You should then be tested every 3 months for as long as you are taking the medicine.  Follow these instructions at home:  Alcohol use  Do not drink alcohol if your health care provider tells you not to drink.  If you drink alcohol:  Limit how much you have to 0-2 drinks a day.  Know how much alcohol is in your drink. In the [place] one drink equals one 12 oz bottle of beer (355 mL), one 5 oz glass of wine (148 mL), or one 1 oz glass of hard liquor (44 mL).  Lifestyle  Do not use any products that contain nicotine or tobacco. These products include cigarettes, chewing tobacco, and vaping devices, such as e-cigarettes. If you need help quitting, ask your health care provider.  Do not use street drugs.  Do not share needles.  Ask your health care provider for help if you need support or information about quitting drugs.  General instructions  Schedule regular health, dental, and eye exams.  Stay current with your vaccines.  Tell your health care provider if:  You often feel depressed.  You have ever been abused or do not feel safe at home.  Summary  Adopting a healthy lifestyle and getting preventive care are important in promoting health and wellness.  Follow your health care provider's instructions about healthy diet, exercising, and getting tested or screened for diseases.  Follow your health care provider's instructions on monitoring your cholesterol and blood pressure.  This information is not intended to replace advice given to you by your health care provider. Make sure you discuss any questions you have with your health care provider.  Document Revised: 03/13/2021 Document Reviewed: 03/13/2021  Elsevier Patient Education  2024 [employer].

## 2024-01-27 NOTE — Progress Notes (Signed)
 Subjective:    Patient ID: Miguel Rail., male    DOB: 1990-12-01, 33 y.o.   MRN: [id number]  HPI  Patient presents to clinic today for his annual exam.   Flu: 10/2021 Tetanus: 12/2018 Pneumovax: Never COVID: never Vision screening: never Dentist: as needed  Diet: He does eat meat. He rarely consumes fruits and veggies. He does eat some fried foods. He drinks mostly soda, water Exercise: None  Review of Systems     Past Medical History:  Diagnosis Date   Asthma     Current Outpatient Medications  Medication Sig Dispense Refill   albuterol (VENTOLIN HFA) 108 (90 Base) MCG/ACT inhaler INHALE 1 TO 2 PUFFS BY MOUTH EVERY 6 HOURS AS NEEDED FOR WHEEZING FOR SHORTNESS OF BREATH 18 g 1   amLODIPine-Valsartan-HCTZ 10-320-25 MG TABS Take 1 tablet by mouth once daily 90 tablet 1   atorvastatin (LIPITOR) 20 MG tablet Take 1 tablet by mouth once daily 90 tablet 0   Blood Glucose Monitoring Suppl DEVI Used to check blood sugar once a day. DX: E11.9   May substitute to any manufacturer covered by patient's insurance. 1 each 0   fenofibrate (TRICOR) 145 MG tablet Take 1 tablet by mouth once daily 90 tablet 1   fluticasone (FLONASE) 50 MCG/ACT nasal spray Place into both nostrils daily.     fluticasone-salmeterol (ADVAIR) 100-50 MCG/ACT AEPB INHALE 1 DOSE BY MOUTH TWICE DAILY 180 each 0   Glucose Blood (BLOOD GLUCOSE TEST STRIPS) STRP Used to check blood sugar once a day. DX: E11.9   May substitute to any manufacturer covered by patient's insurance. 100 strip 1   HYDROcodone-acetaminophen (NORCO/VICODIN) 5-325 MG tablet Take 1 tablet by mouth every 12 (twelve) hours as needed for moderate pain (pain score 4-6). 60 tablet 0   labetalol (NORMODYNE) 100 MG tablet Take 1 tablet (100 mg total) by mouth 2 (two) times daily. 180 tablet 1   Lancet Device MISC Used to check blood sugar once a day. DX: E11.9   May substitute to any manufacturer covered by patient's insurance. 1 each 0   meloxicam  (MOBIC) 15 MG tablet Take 1 tablet by mouth once daily 30 tablet 0   omeprazole (PRILOSEC) 20 MG capsule Take 1 capsule (20 mg total) by mouth daily. 90 capsule 1   No current facility-administered medications for this visit.    No Known Allergies  Family History  Problem Relation Age of Onset   Hypertension Mother     Social History   Socioeconomic History   Marital status: Single    Spouse name: Not on file   Number of children: Not on file   Years of education: Not on file   Highest education level: GED or equivalent  Occupational History   Not on file  Tobacco Use   Smoking status: Former    Current packs/day: 1.00    Average packs/day: 1 pack/day for 11.0 years (11.0 ttl pk-yrs)    Types: Cigarettes   Smokeless tobacco: Never  Vaping Use   Vaping status: Every Day  Substance and Sexual Activity   Alcohol use: Yes    Alcohol/week: 8.0 standard drinks of alcohol    Types: 8 Standard drinks or equivalent per week    Comment: 8 standard on a weekly basis    Drug use: No   Sexual activity: Yes  Other Topics Concern   Not on file  Social History Narrative   Not on file   Social Drivers of  Health   Financial Resource Strain: Low Risk  (11/08/2023)   Overall Financial Resource Strain (CARDIA)    Difficulty of Paying Living Expenses: Not hard at all  Food Insecurity: No Food Insecurity (11/08/2023)   Hunger Vital Sign    Worried About Running Out of Food in the Last Year: Never true    Ran Out of Food in the Last Year: Never true  Transportation Needs: No Transportation Needs (11/08/2023)   PRAPARE - Administrator, Civil Service (Medical): No    Lack of Transportation (Non-Medical): No  Physical Activity: Not on file  Stress: Not on file  Social Connections: Moderately Integrated (11/08/2023)   Social Connection and Isolation Panel [NHANES]    Frequency of Communication with Friends and Family: More than three times a week    Frequency of Social Gatherings  with Friends and Family: Once a week    Attends Religious Services: More than 4 times per year    Active Member of [employer] or Organizations: No    Attends Engineer, structural: Not on file    Marital Status: Married  Catering manager Violence: Not on file     Constitutional: Denies fever, malaise, fatigue, headache or abrupt weight changes.  HEENT: Denies eye pain, eye redness, ear pain, ringing in the ears, wax buildup, runny nose, nasal congestion, bloody nose, or sore throat. Respiratory: Denies difficulty breathing, shortness of breath, cough or sputum production.   Cardiovascular: Denies chest pain, chest tightness, palpitations or swelling in the hands or feet.  Gastrointestinal: Denies abdominal pain, bloating, constipation, diarrhea or blood in the stool.  GU: Denies urgency, frequency, pain with urination, burning sensation, blood in urine, odor or discharge. Musculoskeletal: Patient reports chronic back pain, right hip pain, difficulty with gait..  Denies decrease in range of motion, or joint swelling.  Skin: Denies redness, rashes, lesions or ulcercations.  Neurological: Denies dizziness, difficulty with memory, difficulty with speech or problems with balance and coordination.  Psych: Denies anxiety, depression, SI/HI.  No other specific complaints in a complete review of systems (except as listed in HPI above).  Objective:   Physical Exam BP 122/70 (BP Location: Left Arm, Patient Position: Sitting, Cuff Size: Normal)   Ht 5\' 5"  (1.651 m)   Wt 229 lb 9.6 oz (104.1 kg)   BMI 38.21 kg/m    Wt Readings from Last 3 Encounters:  01/02/24 232 lb (105.2 kg)  12/12/23 235 lb (106.6 kg)  12/05/23 232 lb (105.2 kg)    General: Appears his stated age, obese, in NAD. Skin: Warm, dry and intact.  HEENT: Head: normal shape and size; Eyes: sclera white, no icterus, conjunctiva pink, PERRLA and EOMs intact;  Neck:  Neck supple, trachea midline. No masses, lumps or  thyromegaly present.  Cardiovascular: Normal rate and rhythm. S1,S2 noted.  No murmur, rubs or gallops noted. No JVD.  No BLE edema.  Pulmonary/Chest: Normal effort and positive vesicular breath sounds. No respiratory distress. No wheezes, rales or ronchi noted.  Abdomen: Normal bowel sounds.  Musculoskeletal: Joint enlargement noted in hands.  Strength 5/5 BUE/BLE.  Limping gait without assistive device. Neurological: Alert and oriented. Cranial nerves II-XII grossly intact. Coordination normal.  Psychiatric: Mood and affect normal. Behavior is normal. Judgment and thought content normal.   BMET    Component Value Date/Time   NA 138 08/22/2023 1506   K 4.3 08/22/2023 1506   CL 102 08/22/2023 1506   CO2 28 08/22/2023 1506   GLUCOSE 87 08/22/2023  1506   BUN 26 (H) 08/22/2023 1506   CREATININE 1.04 08/22/2023 1506   CALCIUM 9.4 08/22/2023 1506   GFRNONAA >60 05/23/2023 2133   GFRNONAA 100 05/23/2020 1533   GFRAA >60 07/01/2020 1104   GFRAA 115 05/23/2020 1533    Lipid Panel     Component Value Date/Time   CHOL 169 03/06/2023 1456   TRIG 362 (H) 03/06/2023 1456   HDL 49 03/06/2023 1456   CHOLHDL 3.4 03/06/2023 1456   LDLCALC 76 03/06/2023 1456    CBC    Component Value Date/Time   WBC 6.2 08/22/2023 1506   RBC 4.86 08/22/2023 1506   HGB 13.2 08/22/2023 1506   HCT 40.6 08/22/2023 1506   PLT 256 08/22/2023 1506   MCV 83.5 08/22/2023 1506   MCH 27.2 08/22/2023 1506   MCHC 32.5 08/22/2023 1506   RDW 13.1 08/22/2023 1506   LYMPHSABS 1.5 07/01/2020 1104   MONOABS 0.5 07/01/2020 1104   EOSABS 254 08/22/2023 1506   BASOSABS 43 08/22/2023 1506    Hgb A1C Lab Results  Component Value Date   HGBA1C 6.2 (H) 08/08/2023           Assessment & Plan:   Preventative Health Maintenance:  He declines flu shot today Tetanus UTD Encouraged him to get his COVID-vaccine Pneumovax today Encouraged him to consume a balanced diet and exercise regimen Advised him to see an  eye doctor and dentist annually We will check CBC, c-Met, lipid, A1c and urine microalbumin today  RTC in 6 months, follow-up chronic conditions Nicki Reaper, NP

## 2024-01-27 NOTE — Assessment & Plan Note (Signed)
 Encourage diet and exercise for weight loss

## 2024-01-28 ENCOUNTER — Encounter: Payer: Self-pay | Admitting: Internal Medicine

## 2024-01-28 LAB — COMPLETE METABOLIC PANEL WITH GFR
AG Ratio: 1.8 (calc) (ref 1.0–2.5)
ALT: 23 U/L (ref 9–46)
AST: 19 U/L (ref 10–40)
Albumin: 4.6 g/dL (ref 3.6–5.1)
Alkaline phosphatase (APISO): 35 U/L — ABNORMAL LOW (ref 36–130)
BUN/Creatinine Ratio: 20 (calc) (ref 6–22)
BUN: 28 mg/dL — ABNORMAL HIGH (ref 7–25)
CO2: 27 mmol/L (ref 20–32)
Calcium: 9.4 mg/dL (ref 8.6–10.3)
Chloride: 102 mmol/L (ref 98–110)
Creat: 1.37 mg/dL — ABNORMAL HIGH (ref 0.60–1.26)
Globulin: 2.5 g/dL (ref 1.9–3.7)
Glucose, Bld: 88 mg/dL (ref 65–139)
Potassium: 4 mmol/L (ref 3.5–5.3)
Sodium: 139 mmol/L (ref 135–146)
Total Bilirubin: 0.4 mg/dL (ref 0.2–1.2)
Total Protein: 7.1 g/dL (ref 6.1–8.1)

## 2024-01-28 LAB — HEMOGLOBIN A1C
Hgb A1c MFr Bld: 6.1 %{Hb} — ABNORMAL HIGH (ref <5.7)
Mean Plasma Glucose: 128 mg/dL
eAG (mmol/L): 7.1 mmol/L

## 2024-01-28 LAB — LIPID PANEL
Cholesterol: 156 mg/dL (ref <200)
HDL: 51 mg/dL (ref >=40)
LDL Cholesterol (Calc): 74 mg/dL
Non-HDL Cholesterol (Calc): 105 mg/dL (ref <130)
Total CHOL/HDL Ratio: 3.1 (calc) (ref <5.0)
Triglycerides: 213 mg/dL — ABNORMAL HIGH (ref <150)

## 2024-01-28 LAB — CBC
HCT: 37.8 % — ABNORMAL LOW (ref 38.5–50.0)
Hemoglobin: 12.5 g/dL — ABNORMAL LOW (ref 13.2–17.1)
MCH: 26.5 pg — ABNORMAL LOW (ref 27.0–33.0)
MCHC: 33.1 g/dL (ref 32.0–36.0)
MCV: 80.3 fL (ref 80.0–100.0)
MPV: 11.3 fL (ref 7.5–12.5)
Platelets: 284 10*3/uL (ref 140–400)
RBC: 4.71 10*6/uL (ref 4.20–5.80)
RDW: 14 % (ref 11.0–15.0)
WBC: 7.3 10*3/uL (ref 3.8–10.8)

## 2024-01-28 LAB — MICROALBUMIN / CREATININE URINE RATIO
Creatinine, Urine: 170 mg/dL (ref 20–320)
Microalb Creat Ratio: 8 mg/g{creat} (ref <30)
Microalb, Ur: 1.4 mg/dL

## 2024-02-07 ENCOUNTER — Ambulatory Visit: Attending: Otolaryngology

## 2024-02-07 DIAGNOSIS — G4733 Obstructive sleep apnea (adult) (pediatric): Secondary | ICD-10-CM | POA: Diagnosis not present

## 2024-02-11 DIAGNOSIS — G8929 Other chronic pain: Secondary | ICD-10-CM | POA: Diagnosis not present

## 2024-02-11 DIAGNOSIS — M25551 Pain in right hip: Secondary | ICD-10-CM | POA: Diagnosis not present

## 2024-02-11 DIAGNOSIS — M87051 Idiopathic aseptic necrosis of right femur: Secondary | ICD-10-CM | POA: Diagnosis not present

## 2024-02-12 ENCOUNTER — Encounter: Payer: Self-pay | Admitting: Internal Medicine

## 2024-02-25 ENCOUNTER — Other Ambulatory Visit: Payer: Self-pay | Admitting: Internal Medicine

## 2024-02-25 DIAGNOSIS — J4551 Severe persistent asthma with (acute) exacerbation: Secondary | ICD-10-CM

## 2024-02-26 NOTE — Telephone Encounter (Signed)
 Requested Prescriptions  Pending Prescriptions Disp Refills   VENTOLIN  HFA 108 (90 Base) MCG/ACT inhaler [Pharmacy Med Name: Ventolin  HFA 108 (90 Base) MCG/ACT Inhalation Aerosol Solution] 18 g 1    Sig: INHALE 1 TO 2 PUFFS BY MOUTH EVERY 6 HOURS AS NEEDED FOR WHEEZING FOR SHORTNESS OF BREATH     Pulmonology:  Beta Agonists 2 Passed - 02/26/2024  8:38 AM      Passed - Last BP in normal range    BP Readings from Last 1 Encounters:  01/27/24 122/70         Passed - Last Heart Rate in normal range    Pulse Readings from Last 1 Encounters:  01/02/24 65         Passed - Valid encounter within last 12 months    Recent Outpatient Visits           1 month ago Encounter for general adult medical examination with abnormal findings   [place] [facility] [place], [place] W, NP   2 months ago Avascular necrosis of bone of right hip [facility])   [place] [facility] [place], [place], [place]

## 2024-03-06 ENCOUNTER — Other Ambulatory Visit: Payer: Self-pay | Admitting: Internal Medicine

## 2024-03-09 ENCOUNTER — Other Ambulatory Visit: Payer: Self-pay | Admitting: Internal Medicine

## 2024-03-09 DIAGNOSIS — E782 Mixed hyperlipidemia: Secondary | ICD-10-CM

## 2024-03-09 NOTE — Telephone Encounter (Signed)
 Requested Prescriptions  Pending Prescriptions Disp Refills   fenofibrate  (TRICOR ) 145 MG tablet [Pharmacy Med Name: Fenofibrate  145 MG Oral Tablet] 90 tablet 0    Sig: Take 1 tablet by mouth once daily     Cardiovascular:  Antilipid - Fibric Acid Derivatives Failed - 03/09/2024  2:47 PM      Failed - Cr in normal range and within 360 days    Creat  Date Value Ref Range Status  01/27/2024 1.37 (H) 0.60 - 1.26 mg/dL Final   Creatinine, Urine  Date Value Ref Range Status  01/27/2024 170 20 - 320 mg/dL Final         Failed - HGB in normal range and within 360 days    Hemoglobin  Date Value Ref Range Status  01/27/2024 12.5 (L) 13.2 - 17.1 g/dL Final         Failed - HCT in normal range and within 360 days    HCT  Date Value Ref Range Status  01/27/2024 37.8 (L) 38.5 - 50.0 % Final         Failed - Lipid Panel in normal range within the last 12 months    Cholesterol  Date Value Ref Range Status  01/27/2024 156 <200 mg/dL Final   LDL Cholesterol (Calc)  Date Value Ref Range Status  01/27/2024 74 mg/dL (calc) Final    Comment:    Reference range: <100 . Desirable range <100 mg/dL for primary prevention;   <70 mg/dL for patients with CHD or diabetic patients  with > or = 2 CHD risk factors. Aaron Aas LDL-C is now calculated using the Martin-Hopkins  calculation, which is a validated novel method providing  better accuracy than the Friedewald equation in the  estimation of LDL-C.  Melinda Sprawls et al. Erroll Heard. 9147;829(56): 2061-2068  (http://education.[facility].com/faq/FAQ164)    HDL  Date Value Ref Range Status  01/27/2024 51 > OR = 40 mg/dL Final   Triglycerides  Date Value Ref Range Status  01/27/2024 213 (H) <150 mg/dL Final    Comment:    . If a non-fasting specimen was collected, consider repeat triglyceride testing on a fasting specimen if clinically indicated.  Imagene Mam et al. J. of Clin. Lipidol. 2015;9:129-169. Aaron Aas          Passed - ALT in normal range and  within 360 days    ALT  Date Value Ref Range Status  01/27/2024 23 9 - 46 U/L Final         Passed - AST in normal range and within 360 days    AST  Date Value Ref Range Status  01/27/2024 19 10 - 40 U/L Final         Passed - PLT in normal range and within 360 days    Platelets  Date Value Ref Range Status  01/27/2024 284 140 - 400 Thousand/uL Final         Passed - WBC in normal range and within 360 days    WBC  Date Value Ref Range Status  01/27/2024 7.3 3.8 - 10.8 Thousand/uL Final         Passed - eGFR is 30 or above and within 360 days    GFR, Est African American  Date Value Ref Range Status  05/23/2020 115 > OR = 60 mL/min/1.61m2 Final   GFR calc Af Amer  Date Value Ref Range Status  07/01/2020 >60 >60 mL/min Final   GFR, Est Non African American  Date Value Ref Range Status  05/23/2020  100 > OR = 60 mL/min/1.28m2 Final   GFR, Estimated  Date Value Ref Range Status  05/23/2023 >60 >60 mL/min Final    Comment:    (NOTE) Calculated using the CKD-EPI Creatinine Equation (2021)    eGFR  Date Value Ref Range Status  08/22/2023 98 > OR = 60 mL/min/1.33m2 Final         Passed - Valid encounter within last 12 months    Recent Outpatient Visits           1 month ago Encounter for general adult medical examination with abnormal findings   [place] [facility] [place], [place] W, NP   2 months ago Avascular necrosis of bone of right hip [facility])   [place] [facility] [place], [place], [place]

## 2024-03-10 NOTE — Telephone Encounter (Signed)
 Requested Prescriptions  Pending Prescriptions Disp Refills   atorvastatin  (LIPITOR) 20 MG tablet [Pharmacy Med Name: Atorvastatin  Calcium  20 MG Oral Tablet] 90 tablet 1    Sig: Take 1 tablet by mouth once daily     Cardiovascular:  Antilipid - Statins Failed - 03/10/2024  5:04 PM      Failed - Lipid Panel in normal range within the last 12 months    Cholesterol  Date Value Ref Range Status  01/27/2024 156 <200 mg/dL Final   LDL Cholesterol (Calc)  Date Value Ref Range Status  01/27/2024 74 mg/dL (calc) Final    Comment:    Reference range: <100 . Desirable range <100 mg/dL for primary prevention;   <70 mg/dL for patients with CHD or diabetic patients  with > or = 2 CHD risk factors. Aaron Aas LDL-C is now calculated using the Martin-Hopkins  calculation, which is a validated novel method providing  better accuracy than the Friedewald equation in the  estimation of LDL-C.  Melinda Sprawls et al. Erroll Heard. 1096;045(40): 2061-2068  (http://education.[facility].com/faq/FAQ164)    HDL  Date Value Ref Range Status  01/27/2024 51 > OR = 40 mg/dL Final   Triglycerides  Date Value Ref Range Status  01/27/2024 213 (H) <150 mg/dL Final    Comment:    . If a non-fasting specimen was collected, consider repeat triglyceride testing on a fasting specimen if clinically indicated.  Imagene Mam et al. J. of Clin. Lipidol. 2015;9:129-169. Aaron Aas          Passed - Patient is not pregnant      Passed - Valid encounter within last 12 months    Recent Outpatient Visits           1 month ago Encounter for general adult medical examination with abnormal findings   [place] [facility] [place], [place] W, NP   2 months ago Avascular necrosis of bone of right hip [facility])   [place] [facility] [place], [place], [place]

## 2024-03-18 DIAGNOSIS — M87051 Idiopathic aseptic necrosis of right femur: Secondary | ICD-10-CM | POA: Diagnosis not present

## 2024-03-21 ENCOUNTER — Other Ambulatory Visit: Payer: Self-pay | Admitting: Internal Medicine

## 2024-03-23 ENCOUNTER — Other Ambulatory Visit: Payer: Self-pay | Admitting: Internal Medicine

## 2024-03-24 DIAGNOSIS — Z6838 Body mass index (BMI) 38.0-38.9, adult: Secondary | ICD-10-CM | POA: Diagnosis not present

## 2024-03-24 DIAGNOSIS — I10 Essential (primary) hypertension: Secondary | ICD-10-CM | POA: Diagnosis not present

## 2024-03-24 DIAGNOSIS — M8700 Idiopathic aseptic necrosis of unspecified bone: Secondary | ICD-10-CM | POA: Diagnosis not present

## 2024-03-24 DIAGNOSIS — E66812 Obesity, class 2: Secondary | ICD-10-CM | POA: Diagnosis not present

## 2024-03-24 DIAGNOSIS — M87851 Other osteonecrosis, right femur: Secondary | ICD-10-CM | POA: Diagnosis not present

## 2024-03-24 DIAGNOSIS — M25561 Pain in right knee: Secondary | ICD-10-CM | POA: Diagnosis not present

## 2024-03-24 DIAGNOSIS — Z79899 Other long term (current) drug therapy: Secondary | ICD-10-CM | POA: Diagnosis not present

## 2024-03-24 DIAGNOSIS — J45909 Unspecified asthma, uncomplicated: Secondary | ICD-10-CM | POA: Diagnosis not present

## 2024-03-24 DIAGNOSIS — Z87891 Personal history of nicotine dependence: Secondary | ICD-10-CM | POA: Diagnosis not present

## 2024-03-24 DIAGNOSIS — M25551 Pain in right hip: Secondary | ICD-10-CM | POA: Diagnosis not present

## 2024-03-24 DIAGNOSIS — Z791 Long term (current) use of non-steroidal anti-inflammatories (NSAID): Secondary | ICD-10-CM | POA: Diagnosis not present

## 2024-03-24 DIAGNOSIS — E785 Hyperlipidemia, unspecified: Secondary | ICD-10-CM | POA: Diagnosis not present

## 2024-03-24 DIAGNOSIS — E119 Type 2 diabetes mellitus without complications: Secondary | ICD-10-CM | POA: Diagnosis not present

## 2024-03-24 NOTE — Telephone Encounter (Signed)
 Requested Prescriptions  Pending Prescriptions Disp Refills   amLODIPine -Valsartan -HCTZ 10-320-25 MG TABS [Pharmacy Med Name: amLODIPine -Valsartan -HCTZ 10-320-25 MG Oral Tablet] 90 tablet 0    Sig: Take 1 tablet by mouth once daily     Cardiovascular: CCB + ARB + Diuretic Combos Failed - 03/24/2024 12:41 PM      Failed - Cr in normal range and within 180 days    Creat  Date Value Ref Range Status  01/27/2024 1.37 (H) 0.60 - 1.26 mg/dL Final   Creatinine, Urine  Date Value Ref Range Status  01/27/2024 170 20 - 320 mg/dL Final         Failed - eGFR is 10 or above and within 180 days    GFR, Est African American  Date Value Ref Range Status  05/23/2020 115 > OR = 60 mL/min/1.62m2 Final   GFR calc Af Amer  Date Value Ref Range Status  07/01/2020 >60 >60 mL/min Final   GFR, Est Non African American  Date Value Ref Range Status  05/23/2020 100 > OR = 60 mL/min/1.64m2 Final   GFR, Estimated  Date Value Ref Range Status  05/23/2023 >60 >60 mL/min Final    Comment:    (NOTE) Calculated using the CKD-EPI Creatinine Equation (2021)    eGFR  Date Value Ref Range Status  08/22/2023 98 > OR = 60 mL/min/1.68m2 Final         Passed - K in normal range and within 180 days    Potassium  Date Value Ref Range Status  01/27/2024 4.0 3.5 - 5.3 mmol/L Final         Passed - Na in normal range and within 180 days    Sodium  Date Value Ref Range Status  01/27/2024 139 135 - 146 mmol/L Final         Passed - Patient is not pregnant      Passed - Last BP in normal range    BP Readings from Last 1 Encounters:  01/27/24 122/70         Passed - Last Heart Rate in normal range    Pulse Readings from Last 1 Encounters:  01/02/24 65         Passed - Valid encounter within last 6 months    Recent Outpatient Visits           1 month ago Encounter for general adult medical examination with abnormal findings   [place] [facility] [place], [place], NP   3 months  ago Avascular necrosis of bone of right hip [facility])   [facility] [facility] [place], [place], NP

## 2024-03-25 DIAGNOSIS — I10 Essential (primary) hypertension: Secondary | ICD-10-CM | POA: Diagnosis not present

## 2024-03-25 DIAGNOSIS — Z7902 Long term (current) use of antithrombotics/antiplatelets: Secondary | ICD-10-CM | POA: Diagnosis not present

## 2024-03-25 DIAGNOSIS — J45909 Unspecified asthma, uncomplicated: Secondary | ICD-10-CM | POA: Diagnosis not present

## 2024-03-25 DIAGNOSIS — M87051 Idiopathic aseptic necrosis of right femur: Secondary | ICD-10-CM | POA: Diagnosis not present

## 2024-03-25 DIAGNOSIS — G8929 Other chronic pain: Secondary | ICD-10-CM | POA: Diagnosis not present

## 2024-03-25 DIAGNOSIS — Z87891 Personal history of nicotine dependence: Secondary | ICD-10-CM | POA: Diagnosis not present

## 2024-03-25 DIAGNOSIS — E785 Hyperlipidemia, unspecified: Secondary | ICD-10-CM | POA: Diagnosis not present

## 2024-03-25 DIAGNOSIS — E119 Type 2 diabetes mellitus without complications: Secondary | ICD-10-CM | POA: Diagnosis not present

## 2024-03-25 DIAGNOSIS — M25551 Pain in right hip: Secondary | ICD-10-CM | POA: Diagnosis not present

## 2024-03-25 DIAGNOSIS — K219 Gastro-esophageal reflux disease without esophagitis: Secondary | ICD-10-CM | POA: Diagnosis not present

## 2024-03-25 DIAGNOSIS — Z79899 Other long term (current) drug therapy: Secondary | ICD-10-CM | POA: Diagnosis not present

## 2024-03-25 NOTE — Telephone Encounter (Signed)
 Requested medication (s) are due for refill today: Yes  Requested medication (s) are on the active medication list: Yes  Last refill:  01/27/24  Future visit scheduled: Yes  Notes to clinic:  Manual review.    Requested Prescriptions  Pending Prescriptions Disp Refills   meloxicam  (MOBIC ) 15 MG tablet [Pharmacy Med Name: Meloxicam  15 MG Oral Tablet] 30 tablet 0    Sig: Take 1 tablet by mouth once daily     Analgesics:  COX2 Inhibitors Failed - 03/25/2024 10:36 AM      Failed - Manual Review: Labs are only required if the patient has taken medication for more than 8 weeks.      Failed - HGB in normal range and within 360 days    Hemoglobin  Date Value Ref Range Status  01/27/2024 12.5 (L) 13.2 - 17.1 g/dL Final         Failed - Cr in normal range and within 360 days    Creat  Date Value Ref Range Status  01/27/2024 1.37 (H) 0.60 - 1.26 mg/dL Final   Creatinine, Urine  Date Value Ref Range Status  01/27/2024 170 20 - 320 mg/dL Final         Failed - HCT in normal range and within 360 days    HCT  Date Value Ref Range Status  01/27/2024 37.8 (L) 38.5 - 50.0 % Final         Passed - AST in normal range and within 360 days    AST  Date Value Ref Range Status  01/27/2024 19 10 - 40 U/L Final         Passed - ALT in normal range and within 360 days    ALT  Date Value Ref Range Status  01/27/2024 23 9 - 46 U/L Final         Passed - eGFR is 30 or above and within 360 days    GFR, Est African American  Date Value Ref Range Status  05/23/2020 115 > OR = 60 mL/min/1.73m2 Final   GFR calc Af Amer  Date Value Ref Range Status  07/01/2020 >60 >60 mL/min Final   GFR, Est Non African American  Date Value Ref Range Status  05/23/2020 100 > OR = 60 mL/min/1.10m2 Final   GFR, Estimated  Date Value Ref Range Status  05/23/2023 >60 >60 mL/min Final    Comment:    (NOTE) Calculated using the CKD-EPI Creatinine Equation (2021)    eGFR  Date Value Ref Range Status   08/22/2023 98 > OR = 60 mL/min/1.72m2 Final         Passed - Patient is not pregnant      Passed - Valid encounter within last 12 months    Recent Outpatient Visits           1 month ago Encounter for general adult medical examination with abnormal findings   [place] [facility] [place], [place], NP   3 months ago Avascular necrosis of bone of right hip [facility])   [place] [facility] [place], [place], [place]

## 2024-03-28 ENCOUNTER — Other Ambulatory Visit: Payer: Self-pay | Admitting: Internal Medicine

## 2024-03-28 DIAGNOSIS — J4551 Severe persistent asthma with (acute) exacerbation: Secondary | ICD-10-CM

## 2024-03-31 ENCOUNTER — Ambulatory Visit: Payer: BC Managed Care – PPO | Admitting: Internal Medicine

## 2024-04-01 NOTE — Telephone Encounter (Signed)
 Requested Prescriptions  Pending Prescriptions Disp Refills   VENTOLIN  HFA 108 (90 Base) MCG/ACT inhaler [Pharmacy Med Name: Ventolin  HFA 108 (90 Base) MCG/ACT Inhalation Aerosol Solution] 18 g 0    Sig: INHALE 1 TO 2 PUFFS BY MOUTH EVERY 6 HOURS AS NEEDED FOR WHEEZING OR SHORTNESS OF BREATH     Pulmonology:  Beta Agonists 2 Passed - 04/01/2024 10:43 AM      Passed - Last BP in normal range    BP Readings from Last 1 Encounters:  01/27/24 122/70         Passed - Last Heart Rate in normal range    Pulse Readings from Last 1 Encounters:  01/02/24 65         Passed - Valid encounter within last 12 months    Recent Outpatient Visits           2 months ago Encounter for general adult medical examination with abnormal findings   [place] [facility] [place], [place] W, NP   3 months ago Avascular necrosis of bone of right hip [facility])   [place] [facility] [place], [place], [place]

## 2024-04-12 DIAGNOSIS — G4733 Obstructive sleep apnea (adult) (pediatric): Secondary | ICD-10-CM | POA: Diagnosis not present

## 2024-04-18 ENCOUNTER — Other Ambulatory Visit: Payer: Self-pay | Admitting: Internal Medicine

## 2024-04-21 ENCOUNTER — Ambulatory Visit: Admitting: Internal Medicine

## 2024-04-21 ENCOUNTER — Encounter: Payer: Self-pay | Admitting: Internal Medicine

## 2024-04-21 VITALS — BP 130/78 | HR 70 | Ht 65.0 in | Wt 227.4 lb

## 2024-04-21 DIAGNOSIS — Z9109 Other allergy status, other than to drugs and biological substances: Secondary | ICD-10-CM

## 2024-04-21 DIAGNOSIS — M87051 Idiopathic aseptic necrosis of right femur: Secondary | ICD-10-CM | POA: Diagnosis not present

## 2024-04-21 DIAGNOSIS — Z01818 Encounter for other preprocedural examination: Secondary | ICD-10-CM

## 2024-04-21 MED ORDER — CETIRIZINE HCL 10 MG PO TABS: 10.0000 mg | ORAL_TABLET | Freq: Every day | ORAL | 1 refills | Status: DC

## 2024-04-21 MED ORDER — HYDROCODONE-ACETAMINOPHEN 5-325 MG PO TABS: 1.0000 | ORAL_TABLET | Freq: Every day | ORAL | 0 refills | Status: AC | PRN

## 2024-04-21 NOTE — Patient Instructions (Signed)
 Death of Bone Cells (Osteonecrosis): What to Know  Osteonecrosis is when bone dies because it does not have enough blood supply. Without enough blood supply, the inside layer of the affected bone dies. Over time, small breaks form in the outer layer of the bone. If this affects a bone near a joint, the joint may collapse or move out of place, called dislocation. The most commonly affected bones are: The hip joint, especially the top of the thigh bone, also called the femoral head. The top of the upper arm bone, also called the humeral head. Other parts of the body that may be affected include the jaw, wrist, fingers, knee, and foot. You may hear other names for this condition. It can be called avascular necrosis or aseptic necrosis. What are the causes? Osteonecrosis happens when there is not enough blood flowing to the bones. This can happen if: You break a bone. If blood clots block the blood flow. If fat cells get too big and block the blood supply. What increases the risk? Using too much alcohol. Smoking. Long-term or frequent use of steroid medicines. Having had a joint injury. Being exposed to a lot of radiation. Having a medical condition such as: Sickle cell disease. Gaucher disease. HIV or AIDS. Autoimmune disease. This is a disease in which the immune system attacks healthy parts of the body. What are the signs or symptoms? The main symptoms of this condition are: Pain. If an affected joint collapses, the pain may suddenly get very bad. Less ability to move the affected bone or joint. How is this diagnosed? This condition may be diagnosed based on: Your symptoms and medical history. A physical exam. Imaging tests, such as X-rays, bone scans, and MRI. How is this treated? Treatment for this condition may include: Medicines for pain. Medicine to improve bone growth. Avoiding placing any pressure or weight on the affected area. If osteonecrosis happens in your hip, ankle,  or foot, you may need to use crutches or a rolling scooter to help you move around. Physical therapy to the affected area to help it move better and get stronger. Surgery, such as: Core decompression. One or more holes are placed in the bone for new blood vessels to grow into. This provides a renewed blood supply to the bone. This surgery may reduce pain and pressure and slow the destruction of bones and joints. Osteotomy. The bone is reshaped to reduce stress on the affected area of the joint. Bone grafting. Healthy bone from a different part of your body is used to replace damaged bone. Total joint replacement. The affected surfaces of a bone are replaced with artificial parts called prostheses. Electrical stimulation, also called e-stim. During this procedure, a probe with electricity is placed over the skin. This may help to encourage new bone growth. High-pressure oxygen therapy. This is called hyperbaric oxygen. Follow these instructions at home: Activity Exercise as told. Ask your health care provider if it's safe to drive or use machines while taking your medicine. Do not stand or walk on your injured leg until you're told it's OK. Use crutches or a rolling scooter. Ask what things are safe for you to do at home. Ask when you can go back to work or school. Managing pain, stiffness, and swelling Use ice or an ice pack as told. Place a towel between your skin and the ice. Leave the ice on for 20 minutes, 2-3 times a day. Use heat as told. Use the heat source that your provider  recommends, such as a moist heat pack or a heating pad. Do this as often as told. Place a towel between your skin and the heat source. Leave the heat on for 20-30 minutes. If your skin turns red, take off the ice or heat right away to prevent skin damage. The risk of damage is higher if you can't feel pain, heat, or cold. General instructions Take your medicines only as told. If a bone in your arm or leg is  affected, ask your provider if it's safe for you to drive. Do not smoke, vape, or use nicotine or tobacco. Do not drink alcohol. Keep all follow-up visits. Your provider will check your healing. Where to find more information National Organization for Rare Disorders: rarediseases.org Contact a health care provider if: You have pain that gets worse or does not get better with medicine. You have more trouble moving your joint. Get help right away if: Your pain suddenly becomes very bad. This information is not intended to replace advice given to you by your health care provider. Make sure you discuss any questions you have with your health care provider. Document Revised: 04/09/2023 Document Reviewed: 04/09/2023 Elsevier Patient Education  2024 [employer].

## 2024-04-21 NOTE — Progress Notes (Signed)
 Subjective:    Patient ID: Miguel Perez., male    DOB: [birth year], 33 y.o.   MRN: [id number]  HPI  Discussed the use of AI scribe software for clinical note transcription with the patient, who gave verbal consent to proceed.  Miguel Perez. is a 33 year old male with avascular necrosis of the right hip who presents for surgical clearance.  He has been experiencing persistent right hip pain since January 2025, which led to two emergency room visits on May 20th and 21st, 2025. The pain was exacerbated by the medication fosamax (alendronate), which he self-discontinued due to worsening symptoms.  He initially consulted with an orthopedist at [facility] on May 14th, 2025, where he was prescribed fosamax. However, due to confusion regarding the healthcare provider's affiliation, he was later referred to a [facility] doctor. He has undergone multiple diagnostic studies, including x-rays and two MRIs, though the results are not currently accessible. He is not currently under the care of [facility].  For pain management, he is taking meloxicam  and tylenol . He has previously used vicodin, with only a few pills remaining, and needs a refill. The pharmacy limited his initial prescription to a smaller quantity than expected.  He also uses allegra for allergies, which has become expensive, and is seeking a more affordable alternative.      Review of Systems     Past Medical History:  Diagnosis Date   Asthma     Current Outpatient Medications  Medication Sig Dispense Refill   amLODIPine -Valsartan -HCTZ 10-320-25 MG TABS Take 1 tablet by mouth once daily 90 tablet 0   atorvastatin  (LIPITOR) 20 MG tablet Take 1 tablet by mouth once daily 90 tablet 1   Blood Glucose Monitoring Suppl DEVI Used to check blood sugar once a day. DX: E11.9   May substitute to any manufacturer covered by patient's insurance. 1 each 0   fenofibrate  (TRICOR ) 145 MG tablet Take 1 tablet by mouth once daily  90 tablet 0   fluticasone  (FLONASE ) 50 MCG/ACT nasal spray Place 1 spray into both nostrils daily. 16 g 5   fluticasone -salmeterol (ADVAIR) 100-50 MCG/ACT AEPB INHALE 1 DOSE BY MOUTH TWICE DAILY 180 each 0   Glucose Blood (BLOOD GLUCOSE TEST STRIPS) STRP Used to check blood sugar once a day. DX: E11.9   May substitute to any manufacturer covered by patient's insurance. 100 strip 1   labetalol  (NORMODYNE ) 100 MG tablet Take 1 tablet (100 mg total) by mouth 2 (two) times daily. 180 tablet 1   Lancet Device MISC Used to check blood sugar once a day. DX: E11.9   May substitute to any manufacturer covered by patient's insurance. 1 each 0   meloxicam  (MOBIC ) 15 MG tablet Take 1 tablet by mouth once daily 30 tablet 0   omeprazole  (PRILOSEC) 20 MG capsule Take 1 capsule (20 mg total) by mouth daily. 90 capsule 1   VENTOLIN  HFA 108 (90 Base) MCG/ACT inhaler INHALE 1 TO 2 PUFFS BY MOUTH EVERY 6 HOURS AS NEEDED FOR WHEEZING OR SHORTNESS OF BREATH 18 g 0   No current facility-administered medications for this visit.    No Known Allergies  Family History  Problem Relation Age of Onset   Hypertension Mother     Social History   Socioeconomic History   Marital status: Single    Spouse name: Not on file   Number of children: Not on file   Years of education: Not on file   Highest  education level: GED or equivalent  Occupational History   Not on file  Tobacco Use   Smoking status: Former    Current packs/day: 1.00    Average packs/day: 1 pack/day for 11.0 years (11.0 ttl pk-yrs)    Types: Cigarettes   Smokeless tobacco: Never  Vaping Use   Vaping status: Every Day  Substance and Sexual Activity   Alcohol use: Yes    Alcohol/week: 8.0 standard drinks of alcohol    Types: 8 Standard drinks or equivalent per week    Comment: 8 standard on a weekly basis    Drug use: No   Sexual activity: Yes  Other Topics Concern   Not on file  Social History Narrative   Not on file   Social Drivers of  Health   Financial Resource Strain: Low Risk  (03/18/2024)   [facility]   Overall Financial Resource Strain (CARDIA)    Difficulty of Paying Living Expenses: Not very hard  Food Insecurity: No Food Insecurity (03/18/2024)   Received from [facility]   Hunger Vital Sign    Within the past 12 months, you worried that your food would run out before you got the money to buy more.: Never true    Within the past 12 months, the food you bought just didn't last and you didn't have money to get more.: Never true  Transportation Needs: No Transportation Needs (03/18/2024)   Received from [facility] - Transportation    In the past 12 months, has lack of transportation kept you from medical appointments or from getting medications?: No    Lack of Transportation (Non-Medical): No  Physical Activity: Not on file  Stress: Not on file  Social Connections: Moderately Integrated (11/08/2023)   Social Connection and Isolation Panel    Frequency of Communication with Friends and Family: More than three times a week    Frequency of Social Gatherings with Friends and Family: Once a week    Attends Religious Services: More than 4 times per year    Active Member of [employer] or Organizations: No    Attends Engineer, structural: Not on file    Marital Status: Married  Catering manager Violence: Not At Risk (03/25/2024)   Received from [facility]   Humiliation, Afraid, Rape, and Kick questionnaire    Within the last year, have you been afraid of your partner or ex-partner?: No    Within the last year, have you been humiliated or emotionally abused in other ways by your partner or ex-partner?: No    Within the last year, have you been kicked, hit, slapped, or otherwise physically hurt by your partner or ex-partner?: No    Within the last year, have you been raped or forced to have any kind of sexual activity by your partner or  ex-partner?: No     Constitutional: Denies fever, malaise, fatigue, headache or abrupt weight changes.  Respiratory: Denies difficulty breathing, shortness of breath, cough or sputum production.   Cardiovascular: Denies chest pain, chest tightness, palpitations or swelling in the hands or feet.  Musculoskeletal: Patient reports chronic thoracic back pain, right knee pain, right hip pain, right thigh pain.  Denies decrease in range of motion, difficulty with gait, or joint swelling.  Skin: Denies redness, rashes, lesions or ulcercations.  Neurological: Pt reports right leg weakness. Denies numbness, tingling  or problems with balance and coordination.    No other specific  complaints in a complete review of systems (except as listed in HPI above).  Objective:   Physical Exam  BP 130/78 (BP Location: Right Arm, Patient Position: Sitting, Cuff Size: Normal)   Pulse 70   Ht 5' 5 (1.651 m)   Wt 227 lb 6.4 oz (103.1 kg)   SpO2 97%   BMI 37.84 kg/m     Wt Readings from Last 3 Encounters:  01/27/24 229 lb 9.6 oz (104.1 kg)  01/02/24 232 lb (105.2 kg)  12/12/23 235 lb (106.6 kg)    General: Appears his stated age, obese, in NAD. Skin: Warm, dry and intact. No ulcerations noted. Cardiovascular: Normal rate and rhythm.  No murmurs, rubs or gallops noted. Pulmonary/Chest: Normal effort and positive vesicular breath sounds. No respiratory distress. No wheezes, rales or ronchi noted.  Musculoskeletal: Decreased abduction, adduction, internal and external rotation of the right hip.  No pain with palpation of the right hip.  Pain with palpation along the right IT band.  Normal flexion extension of the right knee but crepitus noted with range of motion.  No joint swelling noted.  Pain with palpation over the right lateral knee.  Strength 5/5 LLE, strength 4/5 with resistance to hip flexion RLE.  Able to stand on tiptoes and heels but difficult.  Limping gait.  Neurological: Alert and oriented.   Sensation intact to RLE.  BMET    Component Value Date/Time   NA 139 01/27/2024 1358   K 4.0 01/27/2024 1358   CL 102 01/27/2024 1358   CO2 27 01/27/2024 1358   GLUCOSE 88 01/27/2024 1358   BUN 28 (H) 01/27/2024 1358   CREATININE 1.37 (H) 01/27/2024 1358   CALCIUM  9.4 01/27/2024 1358   GFRNONAA >60 05/23/2023 2133   GFRNONAA 100 05/23/2020 1533   GFRAA >60 07/01/2020 1104   GFRAA 115 05/23/2020 1533    Lipid Panel     Component Value Date/Time   CHOL 156 01/27/2024 1358   TRIG 213 (H) 01/27/2024 1358   HDL 51 01/27/2024 1358   CHOLHDL 3.1 01/27/2024 1358   LDLCALC 74 01/27/2024 1358    CBC    Component Value Date/Time   WBC 7.3 01/27/2024 1358   RBC 4.71 01/27/2024 1358   HGB 12.5 (L) 01/27/2024 1358   HCT 37.8 (L) 01/27/2024 1358   PLT 284 01/27/2024 1358   MCV 80.3 01/27/2024 1358   MCH 26.5 (L) 01/27/2024 1358   MCHC 33.1 01/27/2024 1358   RDW 14.0 01/27/2024 1358   LYMPHSABS 1.5 07/01/2020 1104   MONOABS 0.5 07/01/2020 1104   EOSABS 254 08/22/2023 1506   BASOSABS 43 08/22/2023 1506    Hgb A1C Lab Results  Component Value Date   HGBA1C 6.1 (H) 01/27/2024           Assessment & Plan:   Assessment and Plan    Avascular Necrosis of the Right Hip Significant pain persists. Alendronate worsened pain, leading to ER visits. Awaiting hip replacement surgery pending medical clearance. - Indication for ECG: Preoperative clearance -Interpretation of ECG: Normal rate and rhythm, no acute findings -Comparison of ECG: 08/2023, unchanged - Order lab work for next week, including BC, c-Met, A1c, PT/INR and APTT. - Fax medical clearance forms after receiving lab results.  Pain Management Pain managed with meloxicam  and acetaminophen . Hydrocodone /acetaminophen  supply limited by pharmacy. Refill needed to manage pain until surgery. - Refill hydrocodone /acetaminophen  5/325 mg, provide 30 tablets.  Allergic Rhinitis Fexofenadine too expensive. Seeking  affordable alternative covered by insurance. - Attempt  to prescribe a different allergy medication to see if insurance will cover it. - Instruct him to call back if the prescribed medication is too expensive.         RTC in 3 months for follow-up of chronic conditions Helayne Lo, NP

## 2024-04-21 NOTE — Telephone Encounter (Signed)
 Requested Prescriptions  Pending Prescriptions Disp Refills   meloxicam  (MOBIC ) 15 MG tablet [Pharmacy Med Name: Meloxicam  15 MG Oral Tablet] 90 tablet 0    Sig: Take 1 tablet by mouth once daily     Analgesics:  COX2 Inhibitors Failed - 04/21/2024 10:54 AM      Failed - Manual Review: Labs are only required if the patient has taken medication for more than 8 weeks.      Failed - HGB in normal range and within 360 days    Hemoglobin  Date Value Ref Range Status  01/27/2024 12.5 (L) 13.2 - 17.1 g/dL Final         Failed - Cr in normal range and within 360 days    Creat  Date Value Ref Range Status  01/27/2024 1.37 (H) 0.60 - 1.26 mg/dL Final   Creatinine, Urine  Date Value Ref Range Status  01/27/2024 170 20 - 320 mg/dL Final         Failed - HCT in normal range and within 360 days    HCT  Date Value Ref Range Status  01/27/2024 37.8 (L) 38.5 - 50.0 % Final         Passed - AST in normal range and within 360 days    AST  Date Value Ref Range Status  01/27/2024 19 10 - 40 U/L Final         Passed - ALT in normal range and within 360 days    ALT  Date Value Ref Range Status  01/27/2024 23 9 - 46 U/L Final         Passed - eGFR is 30 or above and within 360 days    GFR, Est African American  Date Value Ref Range Status  05/23/2020 115 > OR = 60 mL/min/1.35m2 Final   GFR calc Af Amer  Date Value Ref Range Status  07/01/2020 >60 >60 mL/min Final   GFR, Est Non African American  Date Value Ref Range Status  05/23/2020 100 > OR = 60 mL/min/1.22m2 Final   GFR, Estimated  Date Value Ref Range Status  05/23/2023 >60 >60 mL/min Final    Comment:    (NOTE) Calculated using the CKD-EPI Creatinine Equation (2021)    eGFR  Date Value Ref Range Status  08/22/2023 98 > OR = 60 mL/min/1.65m2 Final         Passed - Patient is not pregnant      Passed - Valid encounter within last 12 months    Recent Outpatient Visits           2 months ago Encounter for general  adult medical examination with abnormal findings   [facility] [place], [place] W, NP   4 months ago Avascular necrosis of bone of right hip [facility])   [facility] [place], [place], [place]

## 2024-04-29 ENCOUNTER — Telehealth: Payer: Self-pay

## 2024-04-29 ENCOUNTER — Other Ambulatory Visit

## 2024-04-29 DIAGNOSIS — Z01818 Encounter for other preprocedural examination: Secondary | ICD-10-CM

## 2024-04-29 DIAGNOSIS — M87051 Idiopathic aseptic necrosis of right femur: Secondary | ICD-10-CM

## 2024-04-29 DIAGNOSIS — R7303 Prediabetes: Secondary | ICD-10-CM | POA: Diagnosis not present

## 2024-04-29 NOTE — Telephone Encounter (Signed)
 Copied from CRM [id number]. Topic: General - Running Late >> Apr 29, 2024  1:48 PM Nathanel BROCKS wrote: Patient/patient representative is calling because they are running late for an appointment.  I advised of the running late policy

## 2024-04-30 ENCOUNTER — Ambulatory Visit: Payer: Self-pay | Admitting: Internal Medicine

## 2024-04-30 LAB — HEMOGLOBIN A1C
Hgb A1c MFr Bld: 6.2 % — ABNORMAL HIGH (ref <5.7)
Mean Plasma Glucose: 131 mg/dL
eAG (mmol/L): 7.3 mmol/L

## 2024-04-30 LAB — COMPREHENSIVE METABOLIC PANEL WITH GFR
AG Ratio: 1.6 (calc) (ref 1.0–2.5)
ALT: 35 U/L (ref 9–46)
AST: 22 U/L (ref 10–40)
Albumin: 4.4 g/dL (ref 3.6–5.1)
Alkaline phosphatase (APISO): 33 U/L — ABNORMAL LOW (ref 36–130)
BUN/Creatinine Ratio: 24 (calc) — ABNORMAL HIGH (ref 6–22)
BUN: 28 mg/dL — ABNORMAL HIGH (ref 7–25)
CO2: 25 mmol/L (ref 20–32)
Calcium: 9.3 mg/dL (ref 8.6–10.3)
Chloride: 103 mmol/L (ref 98–110)
Creat: 1.18 mg/dL (ref 0.60–1.26)
Globulin: 2.7 g/dL (ref 1.9–3.7)
Glucose, Bld: 86 mg/dL (ref 65–139)
Potassium: 4 mmol/L (ref 3.5–5.3)
Sodium: 138 mmol/L (ref 135–146)
Total Bilirubin: 0.5 mg/dL (ref 0.2–1.2)
Total Protein: 7.1 g/dL (ref 6.1–8.1)
eGFR: 84 mL/min/{1.73_m2} (ref >=60)

## 2024-04-30 LAB — CBC
HCT: 38.4 % — ABNORMAL LOW (ref 38.5–50.0)
Hemoglobin: 12.4 g/dL — ABNORMAL LOW (ref 13.2–17.1)
MCH: 26.8 pg — ABNORMAL LOW (ref 27.0–33.0)
MCHC: 32.3 g/dL (ref 32.0–36.0)
MCV: 82.9 fL (ref 80.0–100.0)
MPV: 11.5 fL (ref 7.5–12.5)
Platelets: 243 10*3/uL (ref 140–400)
RBC: 4.63 10*6/uL (ref 4.20–5.80)
RDW: 13.9 % (ref 11.0–15.0)
WBC: 7.1 10*3/uL (ref 3.8–10.8)

## 2024-04-30 LAB — PROTIME-INR
INR: 0.9
Prothrombin Time: 10.3 s (ref 9.0–11.5)

## 2024-06-15 ENCOUNTER — Other Ambulatory Visit: Payer: Self-pay | Admitting: Internal Medicine

## 2024-06-17 ENCOUNTER — Other Ambulatory Visit: Payer: Self-pay | Admitting: Internal Medicine

## 2024-06-18 ENCOUNTER — Other Ambulatory Visit: Payer: Self-pay | Admitting: Internal Medicine

## 2024-06-18 NOTE — Telephone Encounter (Signed)
 Requested Prescriptions  Pending Prescriptions Disp Refills   fenofibrate  (TRICOR ) 145 MG tablet [Pharmacy Med Name: Fenofibrate  145 MG Oral Tablet] 90 tablet 1    Sig: Take 1 tablet by mouth once daily     Cardiovascular:  Antilipid - Fibric Acid Derivatives Failed - 06/18/2024  9:55 AM      Failed - HGB in normal range and within 360 days    Hemoglobin  Date Value Ref Range Status  04/29/2024 12.4 (L) 13.2 - 17.1 g/dL Final         Failed - HCT in normal range and within 360 days    HCT  Date Value Ref Range Status  04/29/2024 38.4 (L) 38.5 - 50.0 % Final         Failed - Lipid Panel in normal range within the last 12 months    Cholesterol  Date Value Ref Range Status  01/27/2024 156 <200 mg/dL Final   LDL Cholesterol (Calc)  Date Value Ref Range Status  01/27/2024 74 mg/dL (calc) Final    Comment:    Reference range: <100 . Desirable range <100 mg/dL for primary prevention;   <70 mg/dL for patients with CHD or diabetic patients  with > or = 2 CHD risk factors. SABRA LDL-C is now calculated using the Martin-Hopkins  calculation, which is a validated novel method providing  better accuracy than the Friedewald equation in the  estimation of LDL-C.  Gladis APPLETHWAITE et al. SANDREA. 7986;689(80): 2061-2068  (http://education.Quest[employer].com/faq/FAQ164)    HDL  Date Value Ref Range Status  01/27/2024 51 > OR = 40 mg/dL Final   Triglycerides  Date Value Ref Range Status  01/27/2024 213 (H) <150 mg/dL Final    Comment:    . If a non-fasting specimen was collected, consider repeat triglyceride testing on a fasting specimen if clinically indicated.  Veatrice et al. J. of Clin. Lipidol. 2015;9:129-169. SABRA          Passed - ALT in normal range and within 360 days    ALT  Date Value Ref Range Status  04/29/2024 35 9 - 46 U/L Final         Passed - AST in normal range and within 360 days    AST  Date Value Ref Range Status  04/29/2024 22 10 - 40 U/L Final          Passed - Cr in normal range and within 360 days    Creat  Date Value Ref Range Status  04/29/2024 1.18 0.60 - 1.26 mg/dL Final   Creatinine, Urine  Date Value Ref Range Status  01/27/2024 170 20 - 320 mg/dL Final         Passed - PLT in normal range and within 360 days    Platelets  Date Value Ref Range Status  04/29/2024 243 140 - 400 Thousand/uL Final         Passed - WBC in normal range and within 360 days    WBC  Date Value Ref Range Status  04/29/2024 7.1 3.8 - 10.8 Thousand/uL Final         Passed - eGFR is 30 or above and within 360 days    GFR, Est African American  Date Value Ref Range Status  05/23/2020 115 > OR = 60 mL/min/1.109m2 Final   GFR calc Af Amer  Date Value Ref Range Status  07/01/2020 >60 >60 mL/min Final   GFR, Est Non African American  Date Value Ref Range Status  05/23/2020 100 >  OR = 60 mL/min/1.2m2 Final   GFR, Estimated  Date Value Ref Range Status  05/23/2023 >60 >60 mL/min Final    Comment:    (NOTE) Calculated using the CKD-EPI Creatinine Equation (2021)    eGFR  Date Value Ref Range Status  04/29/2024 84 > OR = 60 mL/min/1.87m2 Final         Passed - Valid encounter within last 12 months    Recent Outpatient Visits           1 month ago Preoperative clearance   [facility] [place], Ang[place] ORN, NP   4 months ago Encounter for general adult medical examination with abnormal findings   [facility] [facility] [place], [place] W, NP   6 months ago Avascular necrosis of bone of right hip [facility])   [facility] [place], Angeline ORN, [place]

## 2024-06-19 NOTE — Telephone Encounter (Signed)
 Requested Prescriptions  Pending Prescriptions Disp Refills   amLODIPine -Valsartan -HCTZ 10-320-25 MG TABS [Pharmacy Med Name: amLODIPine -Valsartan -HCTZ 10-320-25 MG Oral Tablet] 90 tablet 0    Sig: Take 1 tablet by mouth once daily     Cardiovascular: CCB + ARB + Diuretic Combos Passed - 06/19/2024  1:50 PM      Passed - K in normal range and within 180 days    Potassium  Date Value Ref Range Status  04/29/2024 4.0 3.5 - 5.3 mmol/L Final         Passed - Na in normal range and within 180 days    Sodium  Date Value Ref Range Status  04/29/2024 138 135 - 146 mmol/L Final         Passed - Cr in normal range and within 180 days    Creat  Date Value Ref Range Status  04/29/2024 1.18 0.60 - 1.26 mg/dL Final   Creatinine, Urine  Date Value Ref Range Status  01/27/2024 170 20 - 320 mg/dL Final         Passed - eGFR is 10 or above and within 180 days    GFR, Est African American  Date Value Ref Range Status  05/23/2020 115 > OR = 60 mL/min/1.75m2 Final   GFR calc Af Amer  Date Value Ref Range Status  07/01/2020 >60 >60 mL/min Final   GFR, Est Non African American  Date Value Ref Range Status  05/23/2020 100 > OR = 60 mL/min/1.43m2 Final   GFR, Estimated  Date Value Ref Range Status  05/23/2023 >60 >60 mL/min Final    Comment:    (NOTE) Calculated using the CKD-EPI Creatinine Equation (2021)    eGFR  Date Value Ref Range Status  04/29/2024 84 > OR = 60 mL/min/1.70m2 Final         Passed - Patient is not pregnant      Passed - Last BP in normal range    BP Readings from Last 1 Encounters:  04/21/24 130/78         Passed - Last Heart Rate in normal range    Pulse Readings from Last 1 Encounters:  04/21/24 70         Passed - Valid encounter within last 6 months    Recent Outpatient Visits           1 month ago Preoperative clearance   [place] [facility] [place], Angeline ORN, NP   4 months ago Encounter for general adult medical examination  with abnormal findings   [place] [facility] [place], Angeline ORN, NP   6 months ago Avascular necrosis of bone of right hip [facility])    [facility] [place], Angeline ORN, NP

## 2024-06-22 NOTE — Telephone Encounter (Signed)
 Requested Prescriptions  Pending Prescriptions Disp Refills   labetalol  (NORMODYNE ) 100 MG tablet [Pharmacy Med Name: Labetalol  HCl 100 MG Oral Tablet] 180 tablet 0    Sig: Take 1 tablet by mouth twice daily     Cardiovascular:  Beta Blockers Passed - 06/22/2024  3:57 PM      Passed - Last BP in normal range    BP Readings from Last 1 Encounters:  04/21/24 130/78         Passed - Last Heart Rate in normal range    Pulse Readings from Last 1 Encounters:  04/21/24 70         Passed - Valid encounter within last 6 months    Recent Outpatient Visits           2 months ago Preoperative clearance   [place] [facility] [place], Angeline ORN, NP   4 months ago Encounter for general adult medical examination with abnormal findings   [facility] [place], [place] W, NP   6 months ago Avascular necrosis of bone of right hip [facility])   [facility] [place], Angeline ORN, [place]

## 2024-06-27 ENCOUNTER — Other Ambulatory Visit: Payer: Self-pay | Admitting: Internal Medicine

## 2024-06-29 NOTE — Telephone Encounter (Signed)
 Requested medications are due for refill today.  No, see note from pharmacy  Requested medications are on the active medications list.  yes  Last refill. 06/19/2024 #90 0 rf  Future visit scheduled.     Notes to clinic.   Pharmacy comment: Patient is requesting this rx be split into separate rxs for amlodipine  alone and valsartan /hctz combo. Copay w/ new ins is $150 for 90 days.          Requested Prescriptions  Pending Prescriptions Disp Refills   amLODIPine -Valsartan -HCTZ 10-320-25 MG TABS [Pharmacy Med Name: AMLO/VAL/HCTZ 10/320/25MG  TAB] 90 tablet 0    Sig: Take 1 tablet by mouth once daily     Cardiovascular: CCB + ARB + Diuretic Combos Passed - 06/29/2024  3:12 PM      Passed - K in normal range and within 180 days    Potassium  Date Value Ref Range Status  04/29/2024 4.0 3.5 - 5.3 mmol/L Final         Passed - Na in normal range and within 180 days    Sodium  Date Value Ref Range Status  04/29/2024 138 135 - 146 mmol/L Final         Passed - Cr in normal range and within 180 days    Creat  Date Value Ref Range Status  04/29/2024 1.18 0.60 - 1.26 mg/dL Final   Creatinine, Urine  Date Value Ref Range Status  01/27/2024 170 20 - 320 mg/dL Final         Passed - eGFR is 10 or above and within 180 days    GFR, Est African American  Date Value Ref Range Status  05/23/2020 115 > OR = 60 mL/min/1.69m2 Final   GFR calc Af Amer  Date Value Ref Range Status  07/01/2020 >60 >60 mL/min Final   GFR, Est Non African American  Date Value Ref Range Status  05/23/2020 100 > OR = 60 mL/min/1.90m2 Final   GFR, Estimated  Date Value Ref Range Status  05/23/2023 >60 >60 mL/min Final    Comment:    (NOTE) Calculated using the CKD-EPI Creatinine Equation (2021)    eGFR  Date Value Ref Range Status  04/29/2024 84 > OR = 60 mL/min/1.1m2 Final         Passed - Patient is not pregnant      Passed - Last BP in normal range    BP Readings from Last 1 Encounters:   04/21/24 130/78         Passed - Last Heart Rate in normal range    Pulse Readings from Last 1 Encounters:  04/21/24 70         Passed - Valid encounter within last 6 months    Recent Outpatient Visits           2 months ago Preoperative clearance   [facility] [place], Ang[place] ORN, NP   5 months ago Encounter for general adult medical examination with abnormal findings   [facility] [place], Angeline ORN, NP   6 months ago Avascular necrosis of bone of right hip [facility])    [facility] [place], Angeline ORN, NP

## 2024-06-30 MED ORDER — AMLODIPINE BESYLATE 10 MG PO TABS: 10.0000 mg | ORAL_TABLET | Freq: Every day | ORAL | 0 refills | Status: DC

## 2024-06-30 MED ORDER — VALSARTAN-HYDROCHLOROTHIAZIDE 320-25 MG PO TABS: 1.0000 | ORAL_TABLET | Freq: Every day | ORAL | 0 refills | Status: DC

## 2024-07-17 ENCOUNTER — Other Ambulatory Visit: Payer: Self-pay | Admitting: Internal Medicine

## 2024-07-17 NOTE — Telephone Encounter (Signed)
 Requested Prescriptions  Pending Prescriptions Disp Refills   meloxicam  (MOBIC ) 15 MG tablet [Pharmacy Med Name: Meloxicam  15 MG Oral Tablet] 90 tablet 0    Sig: Take 1 tablet by mouth once daily     Analgesics:  COX2 Inhibitors Failed - 07/17/2024  5:12 PM      Failed - Manual Review: Labs are only required if the patient has taken medication for more than 8 weeks.      Failed - HGB in normal range and within 360 days    Hemoglobin  Date Value Ref Range Status  04/29/2024 12.4 (L) 13.2 - 17.1 g/dL Final         Failed - HCT in normal range and within 360 days    HCT  Date Value Ref Range Status  04/29/2024 38.4 (L) 38.5 - 50.0 % Final         Passed - Cr in normal range and within 360 days    Creat  Date Value Ref Range Status  04/29/2024 1.18 0.60 - 1.26 mg/dL Final   Creatinine, Urine  Date Value Ref Range Status  01/27/2024 170 20 - 320 mg/dL Final         Passed - AST in normal range and within 360 days    AST  Date Value Ref Range Status  04/29/2024 22 10 - 40 U/L Final         Passed - ALT in normal range and within 360 days    ALT  Date Value Ref Range Status  04/29/2024 35 9 - 46 U/L Final         Passed - eGFR is 30 or above and within 360 days    GFR, Est African American  Date Value Ref Range Status  05/23/2020 115 > OR = 60 mL/min/1.25m2 Final   GFR calc Af Amer  Date Value Ref Range Status  07/01/2020 >60 >60 mL/min Final   GFR, Est Non African American  Date Value Ref Range Status  05/23/2020 100 > OR = 60 mL/min/1.7m2 Final   GFR, Estimated  Date Value Ref Range Status  05/23/2023 >60 >60 mL/min Final    Comment:    (NOTE) Calculated using the CKD-EPI Creatinine Equation (2021)    eGFR  Date Value Ref Range Status  04/29/2024 84 > OR = 60 mL/min/1.61m2 Final         Passed - Patient is not pregnant      Passed - Valid encounter within last 12 months    Recent Outpatient Visits           2 months ago Preoperative clearance    [facility] [place], Angeline ORN, NP   5 months ago Encounter for general adult medical examination with abnormal findings   [place] [facility] [place], [place] W, NP   7 months ago Avascular necrosis of bone of right hip [facility])   [place] [facility] [place], Angeline ORN, [place]

## 2024-07-30 ENCOUNTER — Encounter: Payer: Self-pay | Admitting: Internal Medicine

## 2024-07-30 ENCOUNTER — Ambulatory Visit: Admitting: Internal Medicine

## 2024-07-30 VITALS — BP 124/74 | Ht 65.0 in | Wt 229.6 lb

## 2024-07-30 DIAGNOSIS — Z23 Encounter for immunization: Secondary | ICD-10-CM

## 2024-07-30 DIAGNOSIS — M8700 Idiopathic aseptic necrosis of unspecified bone: Secondary | ICD-10-CM

## 2024-07-30 DIAGNOSIS — E785 Hyperlipidemia, unspecified: Secondary | ICD-10-CM

## 2024-07-30 DIAGNOSIS — E1165 Type 2 diabetes mellitus with hyperglycemia: Secondary | ICD-10-CM

## 2024-07-30 DIAGNOSIS — K219 Gastro-esophageal reflux disease without esophagitis: Secondary | ICD-10-CM

## 2024-07-30 DIAGNOSIS — I10 Essential (primary) hypertension: Secondary | ICD-10-CM

## 2024-07-30 DIAGNOSIS — J4551 Severe persistent asthma with (acute) exacerbation: Secondary | ICD-10-CM

## 2024-07-30 DIAGNOSIS — E1169 Type 2 diabetes mellitus with other specified complication: Secondary | ICD-10-CM

## 2024-07-30 DIAGNOSIS — D509 Iron deficiency anemia, unspecified: Secondary | ICD-10-CM | POA: Diagnosis not present

## 2024-07-30 DIAGNOSIS — G8929 Other chronic pain: Secondary | ICD-10-CM

## 2024-07-30 DIAGNOSIS — M546 Pain in thoracic spine: Secondary | ICD-10-CM

## 2024-07-30 NOTE — Assessment & Plan Note (Signed)
 Continue advair 100-50 mcg/act BID and albuterol  108 mcg/act as needed Will monitor

## 2024-07-30 NOTE — Assessment & Plan Note (Signed)
 Complicated by morbid obesity CMET and lipid profile today Encouraged low-fat diet  Continue atorvastatin  20 mg and fenofibrate  145 mg daily

## 2024-07-30 NOTE — Assessment & Plan Note (Addendum)
 Complicated by morbid obesity Controlled on amlodipine  10 mg daily, valsartan -HCTZ 320-25 mg daily and labetalol  100 mg BID Reinforced diet and exercise for weight loss CMET today

## 2024-07-30 NOTE — Assessment & Plan Note (Signed)
 Avoid foods that trigger reflux Encourage weight loss as this can help reduce reflux symptoms Continue omeprazole  20 mg daily

## 2024-07-30 NOTE — Assessment & Plan Note (Signed)
 Awaiting surgery, needs dental work done prior Continue meloxicam  15 mg daily and tylenol  OTC prn

## 2024-07-30 NOTE — Assessment & Plan Note (Signed)
CBC and iron panel today 

## 2024-07-30 NOTE — Progress Notes (Signed)
 Subjective:    Patient ID: Miguel KANDICE Abran Mickey., male    DOB: 26-Jan-1991, 33 y.o.   MRN: [id number]  HPI  Patient presents to clinic today for 17-month follow-up of chronic conditions.  HTN: His BP today is 124/74.  He is taking amlodipine , valsartan -HCTZ and labetalol  as prescribed.  ECG from 04/2024 reviewed.  HLD: His last LDL was 74, triglycerides 786, 01/2024.  He denies myalgias on atorvastatin  and fenofibrate .  He tries to consume low-fat diet.  DM2: His last A1c was 6.2%, 04/2024.  He is not taking any oral diabetic medication at this time.  He does not check his sugars.  He does not check his feet routinely.  His last eye exam was > 1 year ago.  Flu 10/2021.  Pneumovax 01/2024.  COVID never.  Asthma: Moderate, persistent.  He denies chronic cough or shortness of breath.  He is using advair and albuterol  as prescribed.  There are no PFTs on file.  He does not follow with pulmonology.  GERD: Triggered by acidic foods.  He denies breakthrough on omeprazole . There is no upper GI on file.  Chronic thoracic back pain/AVN:  Waiting for dental clearance in order to have a right hip replacement. He is taking meloxicam  and tylenol  with some relief of symptoms.  Xray thoracic spine from 10/2021 reviewed. MRI RLE from 12/2023 reviewed. He follows with orthopedics.  OSA: He averages 6 hours of sleep with the use of his CPAP. Sleep study from 02/2024 reviewed.   Anemia: His last H/H was 12.5/38.4, 01/2024. He is not taking any oral iron at this time. He does not follow with hematology.  Review of Systems     Past Medical History:  Diagnosis Date   Asthma     Current Outpatient Medications  Medication Sig Dispense Refill   amLODipine  (NORVASC ) 10 MG tablet Take 1 tablet (10 mg total) by mouth daily. 90 tablet 0   atorvastatin  (LIPITOR) 20 MG tablet Take 1 tablet by mouth once daily 90 tablet 1   Blood Glucose Monitoring Suppl DEVI Used to check blood sugar once a day. DX: E11.9   May  substitute to any manufacturer covered by patient's insurance. 1 each 0   cetirizine  (ZYRTEC ) 10 MG tablet Take 1 tablet (10 mg total) by mouth daily. 90 tablet 1   fenofibrate  (TRICOR ) 145 MG tablet Take 1 tablet by mouth once daily 90 tablet 1   fluticasone  (FLONASE ) 50 MCG/ACT nasal spray Place 1 spray into both nostrils daily. 16 g 5   fluticasone -salmeterol (ADVAIR) 100-50 MCG/ACT AEPB INHALE 1 DOSE BY MOUTH TWICE DAILY 180 each 0   Glucose Blood (BLOOD GLUCOSE TEST STRIPS) STRP Used to check blood sugar once a day. DX: E11.9   May substitute to any manufacturer covered by patient's insurance. 100 strip 1   HYDROcodone -acetaminophen  (NORCO/VICODIN) 5-325 MG tablet Take 1 tablet by mouth daily as needed for moderate pain (pain score 4-6). 30 tablet 0   labetalol  (NORMODYNE ) 100 MG tablet Take 1 tablet by mouth twice daily 180 tablet 0   Lancet Device MISC Used to check blood sugar once a day. DX: E11.9   May substitute to any manufacturer covered by patient's insurance. 1 each 0   meloxicam  (MOBIC ) 15 MG tablet Take 1 tablet by mouth once daily 90 tablet 0   omeprazole  (PRILOSEC) 20 MG capsule Take 1 capsule (20 mg total) by mouth daily. 90 capsule 1   valsartan -hydrochlorothiazide  (DIOVAN -HCT) 320-25 MG tablet Take 1 tablet by  mouth daily. 90 tablet 0   VENTOLIN  HFA 108 (90 Base) MCG/ACT inhaler INHALE 1 TO 2 PUFFS BY MOUTH EVERY 6 HOURS AS NEEDED FOR WHEEZING OR SHORTNESS OF BREATH 18 g 0   No current facility-administered medications for this visit.    No Known Allergies  Family History  Problem Relation Age of Onset   Hypertension Mother     Social History   Socioeconomic History   Marital status: Single    Spouse name: Not on file   Number of children: Not on file   Years of education: Not on file   Highest education level: GED or equivalent  Occupational History   Not on file  Tobacco Use   Smoking status: Former    Current packs/day: 1.00    Average packs/day: 1  pack/day for 11.0 years (11.0 ttl pk-yrs)    Types: Cigarettes   Smokeless tobacco: Never  Vaping Use   Vaping status: Every Day  Substance and Sexual Activity   Alcohol use: Yes    Alcohol/week: 8.0 standard drinks of alcohol    Types: 8 Standard drinks or equivalent per week    Comment: 8 standard on a weekly basis    Drug use: No   Sexual activity: Yes  Other Topics Concern   Not on file  Social History Narrative   Not on file   Social Drivers of Health   Financial Resource Strain: Low Risk  (03/18/2024)   [facility] from [facility]   Overall Financial Resource Strain (CARDIA)    Difficulty of Paying Living Expenses: Not very hard  Food Insecurity: No Food Insecurity (03/18/2024)   [facility]   Hunger Vital Sign    Within the past 12 months, you worried that your food would run out before you got the money to buy more.: Never true    Within the past 12 months, the food you bought just didn't last and you didn't have money to get more.: Never true  Transportation Needs: No Transportation Needs (03/18/2024)   Received from [facility] - Transportation    In the past 12 months, has lack of transportation kept you from medical appointments or from getting medications?: No    Lack of Transportation (Non-Medical): No  Physical Activity: Not on file  Stress: Not on file  Social Connections: Moderately Integrated (11/08/2023)   Social Connection and Isolation Panel    Frequency of Communication with Friends and Family: More than three times a week    Frequency of Social Gatherings with Friends and Family: Once a week    Attends Religious Services: More than 4 times per year    Active Member of [employer] or Organizations: No    Attends Engineer, structural: Not on file    Marital Status: Married  Catering manager Violence: Not At Risk (03/25/2024)   [facility]   Humiliation, Afraid,  Rape, and Kick questionnaire    Within the last year, have you been afraid of your partner or ex-partner?: No    Within the last year, have you been humiliated or emotionally abused in other ways by your partner or ex-partner?: No    Within the last year, have you been kicked, hit, slapped, or otherwise physically hurt by your partner or ex-partner?: No    Within the last year, have you been raped or forced to have any kind of sexual activity by your partner or  ex-partner?: No     Constitutional: Denies fever, malaise, fatigue, headache or abrupt weight changes.  HEENT: Denies eye pain, eye redness, ear pain, ringing in the ears, wax buildup, runny nose, nasal congestion, bloody nose, or sore throat. Respiratory: Denies difficulty breathing, shortness of breath, cough or sputum production.   Cardiovascular: Denies chest pain, chest tightness, palpitations or swelling in the hands or feet.  Gastrointestinal: Denies abdominal pain, bloating, constipation, diarrhea or blood in the stool.  GU: Denies urgency, frequency, pain with urination, burning sensation, blood in urine, odor or discharge. Musculoskeletal: Patient reports chronic back pain, right hip pain, difficulty with gait.  Denies decrease in range of motion, or joint swelling.  Skin: Denies redness, rashes, lesions or ulcercations.  Neurological: Denies dizziness, difficulty with memory, difficulty with speech or problems with balance and coordination.  Psych: Denies anxiety, depression, SI/HI.  No other specific complaints in a complete review of systems (except as listed in HPI above).  Objective:   Physical Exam BP 124/74 (BP Location: Left Arm, Patient Position: Sitting, Cuff Size: Normal)   Ht 5' 5 (1.651 m)   Wt 229 lb 9.6 oz (104.1 kg)   BMI 38.21 kg/m    Wt Readings from Last 3 Encounters:  04/21/24 227 lb 6.4 oz (103.1 kg)  01/27/24 229 lb 9.6 oz (104.1 kg)  01/02/24 232 lb (105.2 kg)    General: Appears his stated  age, obese, in NAD. Skin: Warm, dry and intact. No ulcerations noted. HEENT: Head: normal shape and size; Eyes: sclera white, no icterus, conjunctiva pink, PERRLA and EOMs intact;  Cardiovascular: Normal rate and rhythm. S1,S2 noted.  No murmur, rubs or gallops noted. No JVD or BLE edema.  Pulmonary/Chest: Normal effort and positive vesicular breath sounds. No respiratory distress. No wheezes, rales or ronchi noted.  Abdomen: Normal bowel sounds.  Musculoskeletal: Pain with palpation over the thoracic spine.  Limping gait without use of assistive device. Neurological: Alert and oriented. Cranial nerves II-XII grossly intact. Coordination normal.  Psychiatric: Mood and affect normal. Behavior is normal. Judgment and thought content normal.    BMET    Component Value Date/Time   NA 138 04/29/2024 1421   K 4.0 04/29/2024 1421   CL 103 04/29/2024 1421   CO2 25 04/29/2024 1421   GLUCOSE 86 04/29/2024 1421   BUN 28 (H) 04/29/2024 1421   CREATININE 1.18 04/29/2024 1421   CALCIUM  9.3 04/29/2024 1421   GFRNONAA >60 05/23/2023 2133   GFRNONAA 100 05/23/2020 1533   GFRAA >60 07/01/2020 1104   GFRAA 115 05/23/2020 1533    Lipid Panel     Component Value Date/Time   CHOL 156 01/27/2024 1358   TRIG 213 (H) 01/27/2024 1358   HDL 51 01/27/2024 1358   CHOLHDL 3.1 01/27/2024 1358   LDLCALC 74 01/27/2024 1358    CBC    Component Value Date/Time   WBC 7.1 04/29/2024 1421   RBC 4.63 04/29/2024 1421   HGB 12.4 (L) 04/29/2024 1421   HCT 38.4 (L) 04/29/2024 1421   PLT 243 04/29/2024 1421   MCV 82.9 04/29/2024 1421   MCH 26.8 (L) 04/29/2024 1421   MCHC 32.3 04/29/2024 1421   RDW 13.9 04/29/2024 1421   LYMPHSABS 1.5 07/01/2020 1104   MONOABS 0.5 07/01/2020 1104   EOSABS 254 08/22/2023 1506   BASOSABS 43 08/22/2023 1506    Hgb A1C Lab Results  Component Value Date   HGBA1C 6.2 (H) 04/29/2024  Assessment & Plan:     RTC in 6 months for your annual exam Angeline Laura, NP

## 2024-07-30 NOTE — Assessment & Plan Note (Addendum)
 Complicated by morbid obesity A1c today Urine micro albumin has been checked within the last year Encourage low-carb diet and exercise for weight loss Encourage routine eye exam Encouraged foot exam He will get flu shot today Pneumonia vaccine UTD He declines COVID booster

## 2024-07-30 NOTE — Assessment & Plan Note (Signed)
 Encourage diet and exercise for weight loss

## 2024-07-30 NOTE — Assessment & Plan Note (Signed)
 Encouraged regular stretching and core strengthening Encouraged weight loss as this can help reduce back pain Continue meloxicam  15 mg daily and tylenol  OTC prn

## 2024-07-30 NOTE — Patient Instructions (Signed)

## 2024-07-31 ENCOUNTER — Ambulatory Visit: Payer: Self-pay | Admitting: Internal Medicine

## 2024-07-31 LAB — CBC
HCT: 39.3 % (ref 38.5–50.0)
Hemoglobin: 12.8 g/dL — ABNORMAL LOW (ref 13.2–17.1)
MCH: 26.9 pg — ABNORMAL LOW (ref 27.0–33.0)
MCHC: 32.6 g/dL (ref 32.0–36.0)
MCV: 82.7 fL (ref 80.0–100.0)
MPV: 11.6 fL (ref 7.5–12.5)
Platelets: 279 Thousand/uL (ref 140–400)
RBC: 4.75 Million/uL (ref 4.20–5.80)
RDW: 13.5 % (ref 11.0–15.0)
WBC: 6.8 Thousand/uL (ref 3.8–10.8)

## 2024-07-31 LAB — LIPID PANEL
Cholesterol: 149 mg/dL (ref <200)
HDL: 52 mg/dL (ref >=40)
LDL Cholesterol (Calc): 73 mg/dL
Non-HDL Cholesterol (Calc): 97 mg/dL (ref <130)
Total CHOL/HDL Ratio: 2.9 (calc) (ref <5.0)
Triglycerides: 154 mg/dL — ABNORMAL HIGH (ref <150)

## 2024-07-31 LAB — COMPREHENSIVE METABOLIC PANEL WITH GFR
AG Ratio: 2 (calc) (ref 1.0–2.5)
ALT: 23 U/L (ref 9–46)
AST: 22 U/L (ref 10–40)
Albumin: 4.7 g/dL (ref 3.6–5.1)
Alkaline phosphatase (APISO): 36 U/L (ref 36–130)
BUN/Creatinine Ratio: 21 (calc) (ref 6–22)
BUN: 26 mg/dL — ABNORMAL HIGH (ref 7–25)
CO2: 26 mmol/L (ref 20–32)
Calcium: 9.3 mg/dL (ref 8.6–10.3)
Chloride: 103 mmol/L (ref 98–110)
Creat: 1.21 mg/dL (ref 0.60–1.26)
Globulin: 2.4 g/dL (ref 1.9–3.7)
Glucose, Bld: 89 mg/dL (ref 65–139)
Potassium: 4 mmol/L (ref 3.5–5.3)
Sodium: 139 mmol/L (ref 135–146)
Total Bilirubin: 0.4 mg/dL (ref 0.2–1.2)
Total Protein: 7.1 g/dL (ref 6.1–8.1)
eGFR: 82 mL/min/1.73m2 (ref >=60)

## 2024-07-31 LAB — HEMOGLOBIN A1C
Hgb A1c MFr Bld: 6 % — ABNORMAL HIGH (ref <5.7)
Mean Plasma Glucose: 126 mg/dL
eAG (mmol/L): 7 mmol/L

## 2024-07-31 LAB — IRON,TIBC AND FERRITIN PANEL
%SAT: 10 % — ABNORMAL LOW (ref 20–48)
Ferritin: 159 ng/mL (ref 38–380)
Iron: 45 ug/dL — ABNORMAL LOW (ref 50–180)
TIBC: 455 ug/dL — ABNORMAL HIGH (ref 250–425)

## 2024-09-22 ENCOUNTER — Other Ambulatory Visit: Payer: Self-pay | Admitting: Internal Medicine

## 2024-09-25 NOTE — Telephone Encounter (Signed)
 Requested by interface surescripts. Future visit 01/28/25.  Requested Prescriptions  Pending Prescriptions Disp Refills   amLODipine  (NORVASC ) 10 MG tablet [Pharmacy Med Name: amLODIPine  Besylate 10 MG Oral Tablet] 90 tablet 0    Sig: Take 1 tablet by mouth once daily     Cardiovascular: Calcium  Channel Blockers 2 Passed - 09/25/2024  1:14 PM      Passed - Last BP in normal range    BP Readings from Last 1 Encounters:  07/30/24 124/74         Passed - Last Heart Rate in normal range    Pulse Readings from Last 1 Encounters:  04/21/24 70         Passed - Valid encounter within last 6 months    Recent Outpatient Visits           1 month ago Type 2 diabetes mellitus with hyperglycemia, without long-term current use of insulin (HCC)   [facility] [facility] [place], [place], NP   5 months ago Preoperative clearance   [place] [facility] [place], Angeline ORN, NP   8 months ago Encounter for general adult medical examination with abnormal findings   [place] [facility] [place], Angeline ORN, NP   9 months ago Avascular necrosis of bone of right hip [facility])   [place] [facility] [place], Angeline ORN, NP               fluticasone -salmeterol (ADVAIR) 100-50 MCG/ACT AEPB [Pharmacy Med Name: Fluticasone -Salmeterol 100-50 MCG/ACT Inhalation Aerosol Powder Breath Activated] 180 each 0    Sig: INHALE 1 DOSE BY MOUTH TWICE DAILY     Pulmonology:  Combination Products Passed - 09/25/2024  1:14 PM      Passed - Valid encounter within last 12 months    Recent Outpatient Visits           1 month ago Type 2 diabetes mellitus with hyperglycemia, without long-term current use of insulin (HCC)   [place] [facility] [place], [place], NP   5 months ago Preoperative clearance   [place] [facility] [place], [place], NP   8 months ago Encounter for general adult medical examination with abnormal  findings   [place] [facility] [place], Angeline ORN, NP   9 months ago Avascular necrosis of bone of right hip [facility])   [place] [facility] [place], Angeline ORN, NP               valsartan -hydrochlorothiazide  (DIOVAN -HCT) 320-25 MG tablet [Pharmacy Med Name: Valsartan -hydroCHLOROthiazide  320-25 MG Oral Tablet] 90 tablet 0    Sig: Take 1 tablet by mouth once daily     Cardiovascular: ARB + Diuretic Combos Passed - 09/25/2024  1:14 PM      Passed - K in normal range and within 180 days    Potassium  Date Value Ref Range Status  07/30/2024 4.0 3.5 - 5.3 mmol/L Final         Passed - Na in normal range and within 180 days    Sodium  Date Value Ref Range Status  07/30/2024 139 135 - 146 mmol/L Final         Passed - Cr in normal range and within 180 days    Creat  Date Value Ref Range Status  07/30/2024 1.21 0.60 - 1.26 mg/dL Final   Creatinine, Urine  Date Value Ref Range Status  01/27/2024 170 20 - 320 mg/dL Final         Passed - eGFR is 10 or above and within 180 days    GFR, Est African American  Date Value Ref Range Status  05/23/2020 115 > OR = 60 mL/min/1.42m2 Final   GFR calc Af Amer  Date Value Ref Range Status  07/01/2020 >60 >60 mL/min Final   GFR, Est Non African American  Date Value Ref Range Status  05/23/2020 100 > OR = 60 mL/min/1.25m2 Final   GFR, Estimated  Date Value Ref Range Status  05/23/2023 >60 >60 mL/min Final    Comment:    (NOTE) Calculated using the CKD-EPI Creatinine Equation (2021)    eGFR  Date Value Ref Range Status  07/30/2024 82 > OR = 60 mL/min/1.74m2 Final         Passed - Patient is not pregnant      Passed - Last BP in normal range    BP Readings from Last 1 Encounters:  07/30/24 124/74         Passed - Valid encounter within last 6 months    Recent Outpatient Visits           1 month ago Type 2 diabetes mellitus with hyperglycemia, without long-term current use of insulin [facility])    [facility] [facility] [place], Angeline ORN, NP   5 months ago Preoperative clearance   [place] [facility] [place], Angeline ORN, NP   8 months ago Encounter for general adult medical examination with abnormal findings   [place] [facility] [place], [place], NP   9 months ago Avascular necrosis of bone of right hip [facility])   [place] [facility] [place], Angeline ORN, [place]

## 2024-10-15 ENCOUNTER — Other Ambulatory Visit: Payer: Self-pay | Admitting: Internal Medicine

## 2024-10-16 ENCOUNTER — Other Ambulatory Visit: Payer: Self-pay | Admitting: Internal Medicine

## 2024-10-19 ENCOUNTER — Other Ambulatory Visit: Payer: Self-pay

## 2024-10-19 MED ORDER — MELOXICAM 15 MG PO TABS: 15.0000 mg | ORAL_TABLET | Freq: Every day | ORAL | 0 refills | Status: AC

## 2024-10-19 NOTE — Telephone Encounter (Signed)
 Requested Prescriptions  Pending Prescriptions Disp Refills   cetirizine  (ZYRTEC ) 10 MG tablet [Pharmacy Med Name: Cetirizine  HCl 10 MG Oral Tablet] 90 tablet 1    Sig: Take 1 tablet by mouth once daily     Ear, Nose, and Throat:  Antihistamines 2 Passed - 10/19/2024  2:32 PM      Passed - Cr in normal range and within 360 days    Creat  Date Value Ref Range Status  07/30/2024 1.21 0.60 - 1.26 mg/dL Final   Creatinine, Urine  Date Value Ref Range Status  01/27/2024 170 20 - 320 mg/dL Final         Passed - Valid encounter within last 12 months    Recent Outpatient Visits           2 months ago Type 2 diabetes mellitus with hyperglycemia, without long-term current use of insulin [facility])   [place] [facility] [place], Angeline ORN, NP   6 months ago Preoperative clearance   [place] [facility] [place], Angeline ORN, NP   8 months ago Encounter for general adult medical examination with abnormal findings    [facility] [place], [place], NP   10 months ago Avascular necrosis of bone of right hip [facility])    [facility] [place], Angeline ORN, [place]

## 2024-10-19 NOTE — Telephone Encounter (Signed)
 Duplicate request, signed 10/19/24.  Requested Prescriptions  Pending Prescriptions Disp Refills   meloxicam  (MOBIC ) 15 MG tablet [Pharmacy Med Name: Meloxicam  15 MG Oral Tablet] 90 tablet 0    Sig: Take 1 tablet by mouth once daily     Analgesics:  COX2 Inhibitors Failed - 10/19/2024  8:20 AM      Failed - Manual Review: Labs are only required if the patient has taken medication for more than 8 weeks.      Failed - HGB in normal range and within 360 days    Hemoglobin  Date Value Ref Range Status  07/30/2024 12.8 (L) 13.2 - 17.1 g/dL Final         Passed - Cr in normal range and within 360 days    Creat  Date Value Ref Range Status  07/30/2024 1.21 0.60 - 1.26 mg/dL Final   Creatinine, Urine  Date Value Ref Range Status  01/27/2024 170 20 - 320 mg/dL Final         Passed - HCT in normal range and within 360 days    HCT  Date Value Ref Range Status  07/30/2024 39.3 38.5 - 50.0 % Final         Passed - AST in normal range and within 360 days    AST  Date Value Ref Range Status  07/30/2024 22 10 - 40 U/L Final         Passed - ALT in normal range and within 360 days    ALT  Date Value Ref Range Status  07/30/2024 23 9 - 46 U/L Final         Passed - eGFR is 30 or above and within 360 days    GFR, Est African American  Date Value Ref Range Status  05/23/2020 115 > OR = 60 mL/min/1.63m2 Final   GFR calc Af Amer  Date Value Ref Range Status  07/01/2020 >60 >60 mL/min Final   GFR, Est Non African American  Date Value Ref Range Status  05/23/2020 100 > OR = 60 mL/min/1.60m2 Final   GFR, Estimated  Date Value Ref Range Status  05/23/2023 >60 >60 mL/min Final    Comment:    (NOTE) Calculated using the CKD-EPI Creatinine Equation (2021)    eGFR  Date Value Ref Range Status  07/30/2024 82 > OR = 60 mL/min/1.77m2 Final         Passed - Patient is not pregnant      Passed - Valid encounter within last 12 months    Recent Outpatient Visits           2  months ago Type 2 diabetes mellitus with hyperglycemia, without long-term current use of insulin [facility])   [place] [facility] [place], Angeline ORN, NP   6 months ago Preoperative clearance   [place] [facility] [place], Angeline ORN, NP   8 months ago Encounter for general adult medical examination with abnormal findings   [place] [facility] [place], [place], NP   10 months ago Avascular necrosis of bone of right hip [facility])   [place] [facility] [place], Angeline ORN, [place]

## 2025-01-28 ENCOUNTER — Encounter: Admitting: Internal Medicine
